# Patient Record
Sex: Female | Born: 1940 | Race: White | Hispanic: No | Smoking: Former smoker
Health system: Southern US, Community
[De-identification: ages and names within clinical notes are randomized; demographics above are authoritative.]

## PROBLEM LIST (undated history)

## (undated) DIAGNOSIS — D509 Iron deficiency anemia, unspecified: Secondary | ICD-10-CM

## (undated) DIAGNOSIS — E039 Hypothyroidism, unspecified: Secondary | ICD-10-CM

## (undated) DIAGNOSIS — Z9119 Patient's noncompliance with other medical treatment and regimen: Secondary | ICD-10-CM

## (undated) DIAGNOSIS — I10 Essential (primary) hypertension: Secondary | ICD-10-CM

## (undated) DIAGNOSIS — J449 Chronic obstructive pulmonary disease, unspecified: Secondary | ICD-10-CM

## (undated) DIAGNOSIS — I5042 Chronic combined systolic (congestive) and diastolic (congestive) heart failure: Secondary | ICD-10-CM

## (undated) DIAGNOSIS — Z91199 Patient's noncompliance with other medical treatment and regimen due to unspecified reason: Secondary | ICD-10-CM

## (undated) DIAGNOSIS — N182 Chronic kidney disease, stage 2 (mild): Secondary | ICD-10-CM

## (undated) DIAGNOSIS — I4892 Unspecified atrial flutter: Secondary | ICD-10-CM

## (undated) HISTORY — PX: HERNIA REPAIR: SHX51

---

## 2011-04-07 DIAGNOSIS — D509 Iron deficiency anemia, unspecified: Secondary | ICD-10-CM | POA: Insufficient documentation

## 2011-08-19 ENCOUNTER — Emergency Department: Payer: Self-pay | Admitting: Unknown Physician Specialty

## 2015-12-17 DIAGNOSIS — I519 Heart disease, unspecified: Secondary | ICD-10-CM | POA: Insufficient documentation

## 2017-11-07 ENCOUNTER — Emergency Department: Payer: Medicare Other

## 2017-11-07 ENCOUNTER — Inpatient Hospital Stay
Admission: EM | Admit: 2017-11-07 | Discharge: 2017-11-11 | DRG: 291 | Disposition: A | Discharge status: Home Health | Payer: Medicare Other | Attending: Internal Medicine | Admitting: Internal Medicine

## 2017-11-07 ENCOUNTER — Encounter: Payer: Self-pay | Admitting: *Deleted

## 2017-11-07 ENCOUNTER — Other Ambulatory Visit: Payer: Self-pay

## 2017-11-07 DIAGNOSIS — R609 Edema, unspecified: Secondary | ICD-10-CM

## 2017-11-07 DIAGNOSIS — Z6841 Body Mass Index (BMI) 40.0 and over, adult: Secondary | ICD-10-CM

## 2017-11-07 DIAGNOSIS — I5023 Acute on chronic systolic (congestive) heart failure: Secondary | ICD-10-CM

## 2017-11-07 DIAGNOSIS — E662 Morbid (severe) obesity with alveolar hypoventilation: Secondary | ICD-10-CM | POA: Diagnosis present

## 2017-11-07 DIAGNOSIS — J441 Chronic obstructive pulmonary disease with (acute) exacerbation: Secondary | ICD-10-CM

## 2017-11-07 DIAGNOSIS — I130 Hypertensive heart and chronic kidney disease with heart failure and stage 1 through stage 4 chronic kidney disease, or unspecified chronic kidney disease: Secondary | ICD-10-CM | POA: Diagnosis not present

## 2017-11-07 DIAGNOSIS — Z7901 Long term (current) use of anticoagulants: Secondary | ICD-10-CM

## 2017-11-07 DIAGNOSIS — E874 Mixed disorder of acid-base balance: Secondary | ICD-10-CM | POA: Diagnosis present

## 2017-11-07 DIAGNOSIS — Z9119 Patient's noncompliance with other medical treatment and regimen: Secondary | ICD-10-CM

## 2017-11-07 DIAGNOSIS — I10 Essential (primary) hypertension: Secondary | ICD-10-CM | POA: Diagnosis present

## 2017-11-07 DIAGNOSIS — Z9981 Dependence on supplemental oxygen: Secondary | ICD-10-CM

## 2017-11-07 DIAGNOSIS — Z8674 Personal history of sudden cardiac arrest: Secondary | ICD-10-CM

## 2017-11-07 DIAGNOSIS — Z87891 Personal history of nicotine dependence: Secondary | ICD-10-CM

## 2017-11-07 DIAGNOSIS — D72829 Elevated white blood cell count, unspecified: Secondary | ICD-10-CM | POA: Diagnosis not present

## 2017-11-07 DIAGNOSIS — J9622 Acute and chronic respiratory failure with hypercapnia: Secondary | ICD-10-CM | POA: Diagnosis present

## 2017-11-07 DIAGNOSIS — J811 Chronic pulmonary edema: Secondary | ICD-10-CM

## 2017-11-07 DIAGNOSIS — I4891 Unspecified atrial fibrillation: Secondary | ICD-10-CM | POA: Diagnosis present

## 2017-11-07 DIAGNOSIS — N182 Chronic kidney disease, stage 2 (mild): Secondary | ICD-10-CM | POA: Diagnosis present

## 2017-11-07 DIAGNOSIS — Y92230 Patient room in hospital as the place of occurrence of the external cause: Secondary | ICD-10-CM | POA: Diagnosis not present

## 2017-11-07 DIAGNOSIS — R402413 Glasgow coma scale score 13-15, at hospital admission: Secondary | ICD-10-CM | POA: Diagnosis present

## 2017-11-07 DIAGNOSIS — T380X5A Adverse effect of glucocorticoids and synthetic analogues, initial encounter: Secondary | ICD-10-CM | POA: Diagnosis not present

## 2017-11-07 DIAGNOSIS — I252 Old myocardial infarction: Secondary | ICD-10-CM

## 2017-11-07 DIAGNOSIS — I4892 Unspecified atrial flutter: Secondary | ICD-10-CM | POA: Diagnosis present

## 2017-11-07 DIAGNOSIS — I509 Heart failure, unspecified: Secondary | ICD-10-CM

## 2017-11-07 DIAGNOSIS — Z9114 Patient's other noncompliance with medication regimen: Secondary | ICD-10-CM

## 2017-11-07 DIAGNOSIS — Z79899 Other long term (current) drug therapy: Secondary | ICD-10-CM

## 2017-11-07 DIAGNOSIS — E039 Hypothyroidism, unspecified: Secondary | ICD-10-CM | POA: Diagnosis present

## 2017-11-07 DIAGNOSIS — I5033 Acute on chronic diastolic (congestive) heart failure: Secondary | ICD-10-CM | POA: Diagnosis present

## 2017-11-07 DIAGNOSIS — D631 Anemia in chronic kidney disease: Secondary | ICD-10-CM | POA: Diagnosis present

## 2017-11-07 DIAGNOSIS — J449 Chronic obstructive pulmonary disease, unspecified: Secondary | ICD-10-CM | POA: Diagnosis present

## 2017-11-07 DIAGNOSIS — J9621 Acute and chronic respiratory failure with hypoxia: Secondary | ICD-10-CM | POA: Diagnosis present

## 2017-11-07 HISTORY — DX: Hypothyroidism, unspecified: E03.9

## 2017-11-07 HISTORY — DX: Unspecified atrial flutter: I48.92

## 2017-11-07 HISTORY — DX: Chronic kidney disease, stage 2 (mild): N18.2

## 2017-11-07 HISTORY — DX: Iron deficiency anemia, unspecified: D50.9

## 2017-11-07 HISTORY — DX: Essential (primary) hypertension: I10

## 2017-11-07 HISTORY — DX: Patient's noncompliance with other medical treatment and regimen due to unspecified reason: Z91.199

## 2017-11-07 HISTORY — DX: Morbid (severe) obesity due to excess calories: E66.01

## 2017-11-07 HISTORY — DX: Chronic combined systolic (congestive) and diastolic (congestive) heart failure: I50.42

## 2017-11-07 HISTORY — DX: Chronic obstructive pulmonary disease, unspecified: J44.9

## 2017-11-07 HISTORY — DX: Patient's noncompliance with other medical treatment and regimen: Z91.19

## 2017-11-07 LAB — BASIC METABOLIC PANEL
Anion gap: 9 (ref 5–15)
BUN: 26 mg/dL — AB (ref 6–20)
CHLORIDE: 109 mmol/L (ref 101–111)
CO2: 29 mmol/L (ref 22–32)
Calcium: 8.7 mg/dL — ABNORMAL LOW (ref 8.9–10.3)
Creatinine, Ser: 1.16 mg/dL — ABNORMAL HIGH (ref 0.44–1.00)
GFR calc Af Amer: 52 mL/min — ABNORMAL LOW (ref >60)
GFR calc non Af Amer: 45 mL/min — ABNORMAL LOW (ref >60)
Glucose, Bld: 100 mg/dL — ABNORMAL HIGH (ref 65–99)
Potassium: 4.5 mmol/L (ref 3.5–5.1)
SODIUM: 147 mmol/L — AB (ref 135–145)

## 2017-11-07 LAB — TROPONIN I: Troponin I: 0.04 ng/mL

## 2017-11-07 LAB — BRAIN NATRIURETIC PEPTIDE: B Natriuretic Peptide: 1362 pg/mL — ABNORMAL HIGH (ref 0.0–100.0)

## 2017-11-07 MED ORDER — ALBUTEROL SULFATE (2.5 MG/3ML) 0.083% IN NEBU
5.0000 mg | INHALATION_SOLUTION | Freq: Once | RESPIRATORY_TRACT | Status: AC
  Administered 2017-11-07: 5 mg via RESPIRATORY_TRACT
  Filled 2017-11-07: qty 6

## 2017-11-07 MED ORDER — METHYLPREDNISOLONE SODIUM SUCC 125 MG IJ SOLR
125.0000 mg | Freq: Once | INTRAMUSCULAR | Status: AC
  Administered 2017-11-07: 125 mg via INTRAVENOUS
  Filled 2017-11-07: qty 2

## 2017-11-07 MED ORDER — ALBUTEROL SULFATE (2.5 MG/3ML) 0.083% IN NEBU
2.5000 mg | INHALATION_SOLUTION | Freq: Once | RESPIRATORY_TRACT | Status: AC
  Administered 2017-11-07: 2.5 mg via RESPIRATORY_TRACT
  Filled 2017-11-07: qty 3

## 2017-11-07 MED ORDER — DEXTROSE 5 % IV SOLN
100.0000 mg | Freq: Once | INTRAVENOUS | Status: AC
  Administered 2017-11-07: 100 mg via INTRAVENOUS
  Filled 2017-11-07: qty 100

## 2017-11-07 MED ORDER — FUROSEMIDE 10 MG/ML IJ SOLN
40.0000 mg | Freq: Once | INTRAMUSCULAR | Status: AC
  Administered 2017-11-07: 40 mg via INTRAVENOUS
  Filled 2017-11-07: qty 4

## 2017-11-07 NOTE — ED Notes (Signed)
RT called to place nebulizer on bipap so that this RN can administer breathing treatments ordered.

## 2017-11-07 NOTE — ED Triage Notes (Signed)
Pt brought to ED via EMS. Pt states she does not know why she is here, her grandson called EMS. Pt presents w/ shortness of breath and generalized brawny edema. Pt reports not taking her home meds as they are rx'd because she "doesn't like taking medication". Pt is very poor historian.

## 2017-11-07 NOTE — ED Provider Notes (Addendum)
[facility] Emergency Department Provider Note  ___________________________________________   First MD Initiated Contact with Patient 11/07/17 2009     (approximate)  I have reviewed the triage vital signs and the nursing notes.   HISTORY  Chief Complaint Leg Swelling   HPI Driscilla MoatsMarian L Borland is a 77 y.o. female with a history of CHF as well as COPD on home O2 at 2 L was presenting to the emergency department with worsening shortness of breath as well as diffuse weakness and leg swelling.  She says that she is only been taking her Lasix intermittently and thinks that she is only supposed to take it as needed.  She is denying any pain.  Medics reported that her oxygen saturation was 85% when fire rescue arrived and so they have upped her nasal cannula oxygen to 3 L/min.  If that resolves her oxygen to the 90s.  Patient reports increased leg swelling bilaterally.  Also with reports of fall x2 today.  However, no claims of headache.  Patient able to ambulate.  Patient blames her falls and the diffuse weakness.  Past Medical History:  Diagnosis Date  . CHF (congestive heart failure) (HCC)   . COPD (chronic obstructive pulmonary disease) (HCC)   . Hypertension   . Myocardial infarct (HCC)   . Thyroid disease     There are no active problems to display for this patient.     Prior to Admission medications   Not on File    Allergies Patient has no known allergies.  History reviewed. No pertinent family history.  Social History Social History   Tobacco Use  . Smoking status: Former Smoker    Types: Cigarettes  . Smokeless tobacco: Never Used  Substance Use Topics  . Alcohol use: No    Frequency: Never  . Drug use: No    Review of Systems  Constitutional: No fever/chills Eyes: No visual changes. ENT: No sore throat. Cardiovascular: Denies chest pain. Respiratory: As above Gastrointestinal: No abdominal pain.  No nausea, no vomiting.  No  diarrhea.  No constipation. Genitourinary: Negative for dysuria. Musculoskeletal: Negative for back pain. Skin: Negative for rash. Neurological: Negative for headaches, focal weakness or numbness.   ____________________________________________   PHYSICAL EXAM:  VITAL SIGNS: ED Triage Vitals  Enc Vitals Group     BP 11/07/17 2031 (!) 159/83     Pulse Rate 11/07/17 2031 82     Resp 11/07/17 2031 19     Temp 11/07/17 2031 98.4 F (36.9 C)     Temp Source 11/07/17 2031 Oral     SpO2 11/07/17 2031 99 %     Weight 11/07/17 2033 (!) 375 lb (170.1 kg)     Height 11/07/17 2033 5' (1.524 m)     Head Circumference --      Peak Flow --      Pain Score --      Pain Loc --      Pain Edu? --      Excl. in GC? --     Constitutional: Alert and oriented.  In no acute distress.  Able to ambulate from the EMS gurney to the bed but only with assistance and appears to be getting short of breath with only a couple steps. Eyes: Conjunctivae are normal.  Head: Atraumatic. Nose: No congestion/rhinnorhea. Mouth/Throat: Mucous membranes are moist.  Neck: No stridor.   Cardiovascular: Normal rate, regular rhythm. Grossly normal heart sounds.   Respiratory: Tachypneic with labored respirations.  Poor  air movement with expiratory wheezes throughout. Gastrointestinal: Soft and nontender. No distention. No CVA tenderness. Musculoskeletal: Severe bilateral lower extremity pitting edema to the bilateral calves all the way to the abdomen. Neurologic:  Normal speech and language. No gross focal neurologic deficits are appreciated. Skin:  Skin is warm, dry and intact. No rash noted. Psychiatric: Mood and affect are normal. Speech and behavior are normal.  ____________________________________________   LABS (all labs ordered are listed, but only abnormal results are displayed)  Labs Reviewed  CBC WITH DIFFERENTIAL/PLATELET - Abnormal; Notable for the following components:      Result Value   WBC 12.5  (*)    RBC 5.57 (*)    Hemoglobin 9.6 (*)    HCT 34.6 (*)    MCV 62.2 (*)    MCH 17.3 (*)    MCHC 27.8 (*)    RDW 21.3 (*)    Neutro Abs 9.8 (*)    Monocytes Absolute 1.4 (*)    All other components within normal limits  BASIC METABOLIC PANEL - Abnormal; Notable for the following components:   Sodium 147 (*)    Glucose, Bld 100 (*)    BUN 26 (*)    Creatinine, Ser 1.16 (*)    Calcium 8.7 (*)    GFR calc non Af Amer 45 (*)    GFR calc Af Amer 52 (*)    All other components within normal limits  TROPONIN I - Abnormal; Notable for the following components:   Troponin I 0.04 (*)    All other components within normal limits  BRAIN NATRIURETIC PEPTIDE   ____________________________________________  EKG  ED ECG REPORT I, Arelia Longest, the attending physician, personally viewed and interpreted this ECG.   Date: 11/07/2017  EKG Time: 2042  Rate: 78  Rhythm: normal sinus rhythm  Axis: Normal  Intervals:Prolonged QTC at 504  ST&T Change: No ST segment elevation or depression.  No abnormal T wave inversion.  ____________________________________________  RADIOLOGY  CHF ____________________________________________   PROCEDURES  Procedure(s) performed:   .Critical Care Performed by: Myrna Blazer, MD Authorized by: Myrna Blazer, MD   Critical care provider statement:    Critical care time (minutes):  35   Critical care was necessary to treat or prevent imminent or life-threatening deterioration of the following conditions:  Respiratory failure   Critical care was time spent personally by me on the following activities:  Ordering and review of laboratory studies, ordering and review of radiographic studies, ordering and performing treatments and interventions and re-evaluation of patient's condition    Critical Care performed:   ____________________________________________   INITIAL IMPRESSION / ASSESSMENT AND PLAN / ED  COURSE  Pertinent labs & imaging results that were available during my care of the patient were reviewed by me and considered in my medical decision making (see chart for details).  Differential includes, but is not limited to, viral syndrome, bronchitis including COPD exacerbation, pneumonia, reactive airway disease including asthma, CHF including exacerbation with or without pulmonary/interstitial edema, pneumothorax, ACS, thoracic trauma, and pulmonary embolism. As part of my medical decision making, I reviewed the following data within the electronic MEDICAL RECORD NUMBER Notes from prior ED visits  ----------------------------------------- 10:10 PM on 11/07/2017 -----------------------------------------  Patient with IV established.  Given 40 of Lasix.  Also status post albuterol neb.  However, appears to have worsening labored respirations.  Will start on BiPAP.  Plan on admission.  Signed out to Dr. Anne Hahn.  Patient understanding of the plan and  willing to comply.      ____________________________________________   FINAL CLINICAL IMPRESSION(S) / ED DIAGNOSES  CHF and COPD exacerbations    NEW MEDICATIONS STARTED DURING THIS VISIT:  New Prescriptions   No medications on file     Note:  This document was prepared using Dragon voice recognition software and may include unintentional dictation errors.  \   Myrna Blazer, MD 11/07/17 2211  Patient tolerating the BiPAP well.  Updated family as to the diagnosis as well as treatment plan.   Myrna Blazer, MD 11/07/17 [id number]

## 2017-11-07 NOTE — Consult Note (Signed)
PULMONARY / CRITICAL CARE MEDICINE   Name: Danielle Munoz MRN: [id number] DOB: 17-Oct-1940    ADMISSION DATE:  11/07/2017 CONSULTATION DATE:  11/07/17  REFERRING MD:  Dr. Anne HahnWillis   CHIEF COMPLAINT:  Shortness of breath  HISTORY OF PRESENT ILLNESS:   48F with PMH as below significant for HFpEF and COPD who presents with acute on chronic dyspnea as well as weakness. CXR with pulmonary vascular congestion, BNP elevated at 1,300. Was treated with lasix, albuterol, then developed worsening respiratory distress requiring NIV. Pt is admitted to ICU.  Pt is a reticent historian. Of note, she has been non-compliant with home diuretics and inhalers, stating that she was under the impression that lasix was to be taken as needed rather than scheduled. Pt does endorse non-productive cough and wheezing and denies cp, n/v/d, and fever/chills.  SIGNIFICANT EVENTS: 2/7 admitted to ICU on NIV  PAST MEDICAL HISTORY :  She  has a past medical history of CHF (congestive heart failure) (HCC), COPD (chronic obstructive pulmonary disease) (HCC), Hypertension, Myocardial infarct (HCC), and Thyroid disease.  PAST SURGICAL HISTORY: She  has a past surgical history that includes Hernia repair.  Allergies no known allergies  No current facility-administered medications on file prior to encounter.    No current outpatient medications on file prior to encounter.    FAMILY HISTORY:  Her has no family status information on file.    SOCIAL HISTORY: She  reports that she has quit smoking. Her smoking use included cigarettes. she has never used smokeless tobacco. She reports that she does not drink alcohol or use drugs.  REVIEW OF SYSTEMS:   10 point review of systems negative except as per HPI  SUBJECTIVE:    VITAL SIGNS: BP (!) 124/49   Pulse 69   Temp 98.4 F (36.9 C) (Oral)   Resp (!) 23   Ht 5' (1.524 m)   Wt (!) 170.1 kg (375 lb)   SpO2 98%   BMI 73.24 kg/m   HEMODYNAMICS:    VENTILATOR  SETTINGS:    INTAKE / OUTPUT: No intake/output data recorded.  PHYSICAL EXAMINATION: General:  Non-distressed elderly female Neuro:  Grossly non-focal HEENT:  NCAT Cardiovascular:  S1S2 RRR no m/g/r 1+ peripheral pulses, 2+ pitting edema Lungs:  Diminished, CTAB Abdomen:  Obese, soft non-tender Musculoskeletal:  No gross deformity Skin:  Warm dry acyanotic  LABS:  BMET Recent Labs  Lab 11/07/17 2119  NA 147*  K 4.5  CL 109  CO2 29  BUN 26*  CREATININE 1.16*  GLUCOSE 100*    Electrolytes Recent Labs  Lab 11/07/17 2119  CALCIUM 8.7*    CBC Recent Labs  Lab 11/07/17 2119  WBC 12.5*  HGB 9.6*  HCT 34.6*  PLT 361    Coag's No results for input(s): APTT, INR in the last 168 hours.  Sepsis Markers No results for input(s): LATICACIDVEN, PROCALCITON, O2SATVEN in the last 168 hours.  ABG No results for input(s): PHART, PCO2ART, PO2ART in the last 168 hours.  Liver Enzymes No results for input(s): AST, ALT, ALKPHOS, BILITOT, ALBUMIN in the last 168 hours.  Cardiac Enzymes Recent Labs  Lab 11/07/17 2119  TROPONINI 0.04*    Glucose No results for input(s): GLUCAP in the last 168 hours.  Imaging Dg Chest 1 View  Result Date: 11/07/2017 CLINICAL DATA:  Shortness of breath. EXAM: CHEST 1 VIEW COMPARISON:  August 19, 2011 FINDINGS: The mediastinal contour is normal. The heart size is enlarged. There is pulmonary edema. There is  no focal pneumonia or pleural effusion. Bony structures are stable. IMPRESSION: Congestive heart failure. Electronically Signed   By: Sherian Rein M.D.   On: 11/07/2017 20:27     STUDIES:  2/7 CXR with pulmonary vascular congestion  CULTURES:   ANTIBIOTICS: Doxycycline 2/7>>  LINES/TUBES: PIV  DISCUSSION: 39F with decompensated HF 2/2 medication non-compliance. ?COPD component as well. Admitted on NIV. Diuresis, bronchodilators wean BiPap, transfer to floor when able.  ASSESSMENT / PLAN:  Acute hypoxic respiratory  failure in the setting of acutely decompensated HFpEF H/O COPD Mildly elevated troponin, likely demand ischemia Lower extremity edema, L>R NIV PRN, wean as able Supplemental O2 PRN  spO2 goal 88-92% Diuresis Bronchodilators Steroids Monitor I&O Trend troponin U/S for DVT r/o    Elinor Dodge, PA-S 11/07/2017, 11:01 PM

## 2017-11-08 ENCOUNTER — Inpatient Hospital Stay: Payer: Medicare Other

## 2017-11-08 ENCOUNTER — Encounter: Payer: Self-pay | Admitting: Physician Assistant

## 2017-11-08 ENCOUNTER — Inpatient Hospital Stay (HOSPITAL_COMMUNITY)
Admit: 2017-11-08 | Discharge: 2017-11-08 | Disposition: A | Discharge status: Home / Self Care | Payer: Medicare Other | Attending: Physician Assistant | Admitting: Physician Assistant

## 2017-11-08 DIAGNOSIS — J9621 Acute and chronic respiratory failure with hypoxia: Secondary | ICD-10-CM | POA: Diagnosis not present

## 2017-11-08 DIAGNOSIS — I5023 Acute on chronic systolic (congestive) heart failure: Secondary | ICD-10-CM

## 2017-11-08 DIAGNOSIS — J432 Centrilobular emphysema: Secondary | ICD-10-CM | POA: Diagnosis not present

## 2017-11-08 DIAGNOSIS — E039 Hypothyroidism, unspecified: Secondary | ICD-10-CM | POA: Diagnosis present

## 2017-11-08 DIAGNOSIS — D631 Anemia in chronic kidney disease: Secondary | ICD-10-CM | POA: Diagnosis present

## 2017-11-08 DIAGNOSIS — E874 Mixed disorder of acid-base balance: Secondary | ICD-10-CM | POA: Diagnosis present

## 2017-11-08 DIAGNOSIS — R609 Edema, unspecified: Secondary | ICD-10-CM | POA: Diagnosis not present

## 2017-11-08 DIAGNOSIS — Z8674 Personal history of sudden cardiac arrest: Secondary | ICD-10-CM | POA: Diagnosis not present

## 2017-11-08 DIAGNOSIS — D72829 Elevated white blood cell count, unspecified: Secondary | ICD-10-CM | POA: Diagnosis not present

## 2017-11-08 DIAGNOSIS — I509 Heart failure, unspecified: Secondary | ICD-10-CM

## 2017-11-08 DIAGNOSIS — Z9119 Patient's noncompliance with other medical treatment and regimen: Secondary | ICD-10-CM | POA: Diagnosis not present

## 2017-11-08 DIAGNOSIS — J441 Chronic obstructive pulmonary disease with (acute) exacerbation: Secondary | ICD-10-CM | POA: Diagnosis present

## 2017-11-08 DIAGNOSIS — N182 Chronic kidney disease, stage 2 (mild): Secondary | ICD-10-CM | POA: Diagnosis present

## 2017-11-08 DIAGNOSIS — R748 Abnormal levels of other serum enzymes: Secondary | ICD-10-CM

## 2017-11-08 DIAGNOSIS — Z79899 Other long term (current) drug therapy: Secondary | ICD-10-CM | POA: Diagnosis not present

## 2017-11-08 DIAGNOSIS — I4891 Unspecified atrial fibrillation: Secondary | ICD-10-CM | POA: Diagnosis present

## 2017-11-08 DIAGNOSIS — I5033 Acute on chronic diastolic (congestive) heart failure: Secondary | ICD-10-CM

## 2017-11-08 DIAGNOSIS — T380X5A Adverse effect of glucocorticoids and synthetic analogues, initial encounter: Secondary | ICD-10-CM | POA: Diagnosis not present

## 2017-11-08 DIAGNOSIS — I4892 Unspecified atrial flutter: Secondary | ICD-10-CM | POA: Diagnosis present

## 2017-11-08 DIAGNOSIS — I130 Hypertensive heart and chronic kidney disease with heart failure and stage 1 through stage 4 chronic kidney disease, or unspecified chronic kidney disease: Secondary | ICD-10-CM | POA: Diagnosis present

## 2017-11-08 DIAGNOSIS — J9622 Acute and chronic respiratory failure with hypercapnia: Secondary | ICD-10-CM

## 2017-11-08 DIAGNOSIS — R0603 Acute respiratory distress: Secondary | ICD-10-CM | POA: Diagnosis not present

## 2017-11-08 DIAGNOSIS — R402413 Glasgow coma scale score 13-15, at hospital admission: Secondary | ICD-10-CM | POA: Diagnosis present

## 2017-11-08 DIAGNOSIS — Z9981 Dependence on supplemental oxygen: Secondary | ICD-10-CM | POA: Diagnosis not present

## 2017-11-08 DIAGNOSIS — Y92230 Patient room in hospital as the place of occurrence of the external cause: Secondary | ICD-10-CM | POA: Diagnosis not present

## 2017-11-08 DIAGNOSIS — Z87891 Personal history of nicotine dependence: Secondary | ICD-10-CM | POA: Diagnosis not present

## 2017-11-08 DIAGNOSIS — Z9114 Patient's other noncompliance with medication regimen: Secondary | ICD-10-CM | POA: Diagnosis not present

## 2017-11-08 DIAGNOSIS — Z6841 Body Mass Index (BMI) 40.0 and over, adult: Secondary | ICD-10-CM | POA: Diagnosis not present

## 2017-11-08 DIAGNOSIS — I252 Old myocardial infarction: Secondary | ICD-10-CM | POA: Diagnosis not present

## 2017-11-08 DIAGNOSIS — Z7901 Long term (current) use of anticoagulants: Secondary | ICD-10-CM | POA: Diagnosis not present

## 2017-11-08 DIAGNOSIS — E662 Morbid (severe) obesity with alveolar hypoventilation: Secondary | ICD-10-CM | POA: Diagnosis not present

## 2017-11-08 LAB — BASIC METABOLIC PANEL
ANION GAP: 5 (ref 5–15)
BUN: 25 mg/dL — ABNORMAL HIGH (ref 6–20)
CO2: 32 mmol/L (ref 22–32)
Calcium: 8.2 mg/dL — ABNORMAL LOW (ref 8.9–10.3)
Chloride: 107 mmol/L (ref 101–111)
Creatinine, Ser: 1.06 mg/dL — ABNORMAL HIGH (ref 0.44–1.00)
GFR calc non Af Amer: 50 mL/min — ABNORMAL LOW (ref >60)
GFR, EST AFRICAN AMERICAN: 58 mL/min — AB (ref >60)
GLUCOSE: 132 mg/dL — AB (ref 65–99)
POTASSIUM: 4.5 mmol/L (ref 3.5–5.1)
Sodium: 144 mmol/L (ref 135–145)

## 2017-11-08 LAB — CBC WITH DIFFERENTIAL/PLATELET
Basophils Absolute: 0.1 10*3/uL (ref 0–0.1)
Basophils Relative: 1 %
EOS ABS: 0.1 10*3/uL (ref 0–0.7)
Eosinophils Relative: 1 %
HCT: 34.6 % — ABNORMAL LOW (ref 35.0–47.0)
Hemoglobin: 9.6 g/dL — ABNORMAL LOW (ref 12.0–16.0)
LYMPHS ABS: 1.2 10*3/uL (ref 1.0–3.6)
Lymphocytes Relative: 10 %
MCH: 17.3 pg — ABNORMAL LOW (ref 26.0–34.0)
MCHC: 27.8 g/dL — ABNORMAL LOW (ref 32.0–36.0)
MCV: 62.2 fL — ABNORMAL LOW (ref 80.0–100.0)
Monocytes Absolute: 1.4 10*3/uL — ABNORMAL HIGH (ref 0.2–0.9)
Monocytes Relative: 11 %
NEUTROS ABS: 9.8 10*3/uL — AB (ref 1.4–6.5)
Neutrophils Relative %: 77 %
Platelets: 361 10*3/uL (ref 150–440)
RBC: 5.57 MIL/uL — ABNORMAL HIGH (ref 3.80–5.20)
RDW: 21.3 % — ABNORMAL HIGH (ref 11.5–14.5)
WBC: 12.5 10*3/uL — AB (ref 3.6–11.0)

## 2017-11-08 LAB — BLOOD GAS, ARTERIAL
ALLENS TEST (PASS/FAIL): POSITIVE — AB
ALLENS TEST (PASS/FAIL): POSITIVE — AB
Acid-Base Excess: 5.7 mmol/L — ABNORMAL HIGH (ref 0.0–2.0)
Acid-Base Excess: 7.1 mmol/L — ABNORMAL HIGH (ref 0.0–2.0)
BICARBONATE: 35.8 mmol/L — AB (ref 20.0–28.0)
Bicarbonate: 34.7 mmol/L — ABNORMAL HIGH (ref 20.0–28.0)
Expiratory PAP: 5
FIO2: 35
FIO2: 35
Inspiratory PAP: 15
Mechanical Rate: 8
O2 SAT: 94.5 %
O2 SAT: 91.5 %
PCO2 ART: 81 mmHg — AB (ref 32.0–48.0)
PCO2 ART: 78 mmHg — AB (ref 32.0–48.0)
PH ART: 7.24 — AB (ref 7.350–7.450)
PH ART: 7.27 — AB (ref 7.350–7.450)
Patient temperature: 37
Patient temperature: 37
RATE: 10 resp/min
pO2, Arterial: 85 mmHg (ref 83.0–108.0)
pO2, Arterial: 71 mmHg — ABNORMAL LOW (ref 83.0–108.0)

## 2017-11-08 LAB — ECHOCARDIOGRAM COMPLETE
HEIGHTINCHES: 60 in
WEIGHTICAEL: 4370.4 [oz_av]

## 2017-11-08 LAB — CBC
HCT: 37.7 % (ref 35.0–47.0)
HEMOGLOBIN: 10.2 g/dL — AB (ref 12.0–16.0)
MCH: 16.9 pg — ABNORMAL LOW (ref 26.0–34.0)
MCHC: 27.1 g/dL — ABNORMAL LOW (ref 32.0–36.0)
MCV: 62.5 fL — AB (ref 80.0–100.0)
Platelets: 362 10*3/uL (ref 150–440)
RBC: 6.03 MIL/uL — AB (ref 3.80–5.20)
RDW: 21.6 % — ABNORMAL HIGH (ref 11.5–14.5)
WBC: 12.9 10*3/uL — AB (ref 3.6–11.0)

## 2017-11-08 LAB — MRSA PCR SCREENING: MRSA by PCR: NEGATIVE

## 2017-11-08 LAB — PROCALCITONIN

## 2017-11-08 LAB — TROPONIN I
Troponin I: 0.03 ng/mL (ref <0.03)
Troponin I: 0.03 ng/mL (ref <0.03)
Troponin I: <0.03 ng/mL (ref <0.03)

## 2017-11-08 LAB — HEPARIN LEVEL (UNFRACTIONATED): Heparin Unfractionated: <0.1 IU/mL — ABNORMAL LOW (ref 0.30–0.70)

## 2017-11-08 LAB — APTT
APTT: 28 s (ref 24–36)
aPTT: 80 seconds — ABNORMAL HIGH (ref 24–36)

## 2017-11-08 LAB — GLUCOSE, CAPILLARY: Glucose-Capillary: 137 mg/dL — ABNORMAL HIGH (ref 65–99)

## 2017-11-08 MED ORDER — ACETAMINOPHEN 325 MG PO TABS: 650.0000 mg | ORAL_TABLET | Freq: Four times a day (QID) | ORAL | Status: DC | PRN

## 2017-11-08 MED ORDER — ACETAMINOPHEN 650 MG RE SUPP: 650.0000 mg | Freq: Four times a day (QID) | RECTAL | Status: DC | PRN

## 2017-11-08 MED ORDER — ONDANSETRON HCL 4 MG/2ML IJ SOLN: 4.0000 mg | Freq: Four times a day (QID) | INTRAMUSCULAR | Status: DC | PRN

## 2017-11-08 MED ORDER — APIXABAN 5 MG PO TABS: 5.0000 mg | ORAL_TABLET | Freq: Two times a day (BID) | ORAL | Status: DC

## 2017-11-08 MED ORDER — AZITHROMYCIN 500 MG PO TABS
500.0000 mg | ORAL_TABLET | Freq: Every day | ORAL | Status: DC
  Filled 2017-11-08: qty 1

## 2017-11-08 MED ORDER — FUROSEMIDE 10 MG/ML IJ SOLN
40.0000 mg | Freq: Two times a day (BID) | INTRAMUSCULAR | Status: DC
Start: 2017-11-08 — End: 2017-11-11
  Administered 2017-11-08 – 2017-11-11 (×6): 40 mg via INTRAVENOUS
  Filled 2017-11-08 (×6): qty 4

## 2017-11-08 MED ORDER — FUROSEMIDE 10 MG/ML IJ SOLN
20.0000 mg | Freq: Once | INTRAMUSCULAR | Status: AC
  Administered 2017-11-08: 20 mg via INTRAVENOUS
  Filled 2017-11-08: qty 2

## 2017-11-08 MED ORDER — FUROSEMIDE 10 MG/ML IJ SOLN
20.0000 mg | Freq: Two times a day (BID) | INTRAMUSCULAR | Status: DC
  Administered 2017-11-08: 20 mg via INTRAVENOUS
  Filled 2017-11-08 (×2): qty 2

## 2017-11-08 MED ORDER — BUDESONIDE 0.25 MG/2ML IN SUSP
0.2500 mg | Freq: Two times a day (BID) | RESPIRATORY_TRACT | Status: DC
Start: 2017-11-08 — End: 2017-11-11
  Administered 2017-11-08 – 2017-11-11 (×7): 0.25 mg via RESPIRATORY_TRACT
  Filled 2017-11-08 (×5): qty 2

## 2017-11-08 MED ORDER — HEPARIN BOLUS VIA INFUSION
4000.0000 [IU] | Freq: Once | INTRAVENOUS | Status: AC
  Administered 2017-11-08: 4000 [IU] via INTRAVENOUS
  Filled 2017-11-08: qty 4000

## 2017-11-08 MED ORDER — ONDANSETRON HCL 4 MG PO TABS: 4.0000 mg | ORAL_TABLET | Freq: Four times a day (QID) | ORAL | Status: DC | PRN

## 2017-11-08 MED ORDER — HEPARIN (PORCINE) IN NACL 100-0.45 UNIT/ML-% IJ SOLN
1150.0000 [IU]/h | INTRAMUSCULAR | Status: AC
  Administered 2017-11-08 – 2017-11-09 (×2): 1150 [IU]/h via INTRAVENOUS
  Filled 2017-11-08 (×2): qty 250

## 2017-11-08 MED ORDER — LEVOTHYROXINE SODIUM 100 MCG PO TABS
100.0000 ug | ORAL_TABLET | Freq: Every day | ORAL | Status: DC
  Administered 2017-11-08 – 2017-11-11 (×4): 100 ug via ORAL
  Filled 2017-11-08: qty 1
  Filled 2017-11-08: qty 2
  Filled 2017-11-08: qty 1
  Filled 2017-11-08: qty 2

## 2017-11-08 MED ORDER — IPRATROPIUM-ALBUTEROL 0.5-2.5 (3) MG/3ML IN SOLN
3.0000 mL | Freq: Four times a day (QID) | RESPIRATORY_TRACT | Status: DC | PRN
Start: 2017-11-08 — End: 2017-11-11
  Administered 2017-11-09: 3 mL via RESPIRATORY_TRACT
  Filled 2017-11-08: qty 3

## 2017-11-08 MED ORDER — METHYLPREDNISOLONE SODIUM SUCC 125 MG IJ SOLR
60.0000 mg | Freq: Four times a day (QID) | INTRAMUSCULAR | Status: DC
  Administered 2017-11-08 – 2017-11-09 (×4): 60 mg via INTRAVENOUS
  Filled 2017-11-08 (×4): qty 2

## 2017-11-08 MED ORDER — LEVOFLOXACIN IN D5W 750 MG/150ML IV SOLN
750.0000 mg | Freq: Every day | INTRAVENOUS | Status: DC
  Administered 2017-11-08 – 2017-11-09 (×2): 750 mg via INTRAVENOUS
  Filled 2017-11-08 (×3): qty 150

## 2017-11-08 NOTE — H&P (Signed)
[facility] [place] at [facility]   PATIENT NAME: Danielle Munoz    MR#:  [id number]  DATE OF BIRTH:  [birth year]  DATE OF ADMISSION:  11/07/2017  PRIMARY CARE PHYSICIAN: [facility], [facility]   REQUESTING/REFERRING PHYSICIAN: Pershing Proud, MD  CHIEF COMPLAINT:   Chief Complaint  Patient presents with  . Leg Swelling    HISTORY OF PRESENT ILLNESS:  Danielle Munoz  is a 77 y.o. female who presents with leg swelling and shortness of breath.  Patient was found here to be in significant heart failure exacerbation.  She required BiPAP due to her respiratory status.  Hospitalist were called for admission  PAST MEDICAL HISTORY:   Past Medical History:  Diagnosis Date  . CHF (congestive heart failure) (HCC)   . COPD (chronic obstructive pulmonary disease) (HCC)   . Hypertension   . Myocardial infarct (HCC)   . Thyroid disease     PAST SURGICAL HISTORY:   Past Surgical History:  Procedure Laterality Date  . HERNIA REPAIR      SOCIAL HISTORY:   Social History   Tobacco Use  . Smoking status: Former Smoker    Types: Cigarettes  . Smokeless tobacco: Never Used  Substance Use Topics  . Alcohol use: No    Frequency: Never    FAMILY HISTORY:   Family History  Problem Relation Age of Onset  . Diabetes Brother   . CAD Mother   . CAD Father   . Breast cancer Sister     DRUG ALLERGIES:  Allergies no known allergies  MEDICATIONS AT HOME:   Prior to Admission medications   Medication Sig Start Date End Date Taking? Authorizing Provider  apixaban (ELIQUIS) 5 MG TABS tablet Take 5 mg by mouth 2 (two) times daily. 07/16/17  Yes [provider]  furosemide (LASIX) 20 MG tablet Take 20 mg by mouth daily as needed. 07/22/17 07/22/18 Yes [provider]  levothyroxine (SYNTHROID, LEVOTHROID) 100 MCG tablet Take 100 mcg by mouth daily. 07/18/17 07/18/18 Yes [provider]    REVIEW OF SYSTEMS:  Review of Systems   Constitutional: Negative for chills, fever, malaise/fatigue and weight loss.  HENT: Negative for ear pain, hearing loss and tinnitus.   Eyes: Negative for blurred vision, double vision, pain and redness.  Respiratory: Positive for shortness of breath. Negative for cough and hemoptysis.   Cardiovascular: Positive for leg swelling. Negative for chest pain, palpitations and orthopnea.  Gastrointestinal: Negative for abdominal pain, constipation, diarrhea, nausea and vomiting.  Genitourinary: Negative for dysuria, frequency and hematuria.  Musculoskeletal: Negative for back pain, joint pain and neck pain.  Skin:       No acne, rash, or lesions  Neurological: Negative for dizziness, tremors, focal weakness and weakness.  Endo/Heme/Allergies: Negative for polydipsia. Does not bruise/bleed easily.  Psychiatric/Behavioral: Negative for depression. The patient is not nervous/anxious and does not have insomnia.      VITAL SIGNS:   Vitals:   11/07/17 2230 11/07/17 2245 11/07/17 2300 11/07/17 2310  BP: (!) 136/58 (!) 139/50 (!) 143/54   Pulse: 69 69 74 63  Resp: 16 18 18 14   Temp:      TempSrc:      SpO2: 100% 100% 99% 100%  Weight:      Height:       Wt Readings from Last 3 Encounters:  11/07/17 (!) 170.1 kg (375 lb)    PHYSICAL EXAMINATION:  Physical Exam  Vitals reviewed. Constitutional: She is oriented to person, place,  and time. She appears well-developed and well-nourished. No distress.  HENT:  Head: Normocephalic and atraumatic.  Mouth/Throat: Oropharynx is clear and moist.  Eyes: Conjunctivae and EOM are normal. Pupils are equal, round, and reactive to light. No scleral icterus.  Neck: Normal range of motion. Neck supple. No JVD present. No thyromegaly present.  Cardiovascular: Normal rate, regular rhythm and intact distal pulses. Exam reveals no gallop and no friction rub.  No murmur heard. Respiratory: Effort normal. No respiratory distress. She has no wheezes. She has  rales.  GI: Soft. Bowel sounds are normal. She exhibits no distension. There is no tenderness.  Musculoskeletal: Normal range of motion. She exhibits edema.  No arthritis, no gout  Lymphadenopathy:    She has no cervical adenopathy.  Neurological: She is alert and oriented to person, place, and time. No cranial nerve deficit.  No dysarthria, no aphasia  Skin: Skin is warm and dry. No rash noted. No erythema.  Psychiatric: She has a normal mood and affect. Her behavior is normal. Judgment and thought content normal.    LABORATORY PANEL:   CBC Recent Labs  Lab 11/07/17 2119  WBC 12.5*  HGB 9.6*  HCT 34.6*  PLT 361   ------------------------------------------------------------------------------------------------------------------  Chemistries  Recent Labs  Lab 11/07/17 2119  NA 147*  K 4.5  CL 109  CO2 29  GLUCOSE 100*  BUN 26*  CREATININE 1.16*  CALCIUM 8.7*   ------------------------------------------------------------------------------------------------------------------  Cardiac Enzymes Recent Labs  Lab 11/07/17 2119  TROPONINI 0.04*   ------------------------------------------------------------------------------------------------------------------  RADIOLOGY:  Dg Chest 1 View  Result Date: 11/07/2017 CLINICAL DATA:  Shortness of breath. EXAM: CHEST 1 VIEW COMPARISON:  August 19, 2011 FINDINGS: The mediastinal contour is normal. The heart size is enlarged. There is pulmonary edema. There is no focal pneumonia or pleural effusion. Bony structures are stable. IMPRESSION: Congestive heart failure. Electronically Signed   By: Sherian ReinWei-Chen  Lin M.D.   On: 11/07/2017 20:27    EKG:   Orders placed or performed during the hospital encounter of 11/07/17  . ED EKG  . ED EKG  . EKG 12-Lead  . EKG 12-Lead    IMPRESSION AND PLAN:  Principal Problem:   Acute on chronic systolic CHF (congestive heart failure) (HCC) -diuretics given in the ED, repeat dose ordered,  patient required BiPAP due to her respiratory status, will admit her to stepdown and get a cardiology consult Active Problems:   HTN (hypertension) -continue home meds   COPD (chronic obstructive pulmonary disease) (HCC) -home dose inhalers   Hypothyroidism -home dose thyroid replacement  All the records are reviewed and case discussed with ED provider. Management plans discussed with the patient and/or family.  DVT PROPHYLAXIS: Systemic anticoagulation  GI PROPHYLAXIS: None  ADMISSION STATUS: Inpatient  CODE STATUS: Full Code Status History    This patient does not have a recorded code status. Please follow your organizational policy for patients in this situation.      TOTAL TIME TAKING CARE OF THIS PATIENT: 45 minutes.   Rane Blitch FIELDING 11/08/2017, 12:00 AM  [facility]  Office  [phone]  CC: Primary care physician; [facility]  Note:  This document was prepared using Dragon voice recognition software and may include unintentional dictation errors.

## 2017-11-08 NOTE — Progress Notes (Signed)
*  PRELIMINARY RESULTS* Echocardiogram 2D Echocardiogram has been performed.  Cristela BlueHege, Shyne Lehrke 11/08/2017, 1:51 PM

## 2017-11-08 NOTE — Plan of Care (Signed)
  Progressing Clinical Measurements: Ability to maintain clinical measurements within normal limits will improve 11/08/2017 0412 - Progressing by Sandi RavelingFlack, Tunya Held, RN Diagnostic test results will improve 11/08/2017 0412 - Progressing by Sandi RavelingFlack, Sahith Nurse, RN Respiratory complications will improve 11/08/2017 0412 - Progressing by Sandi RavelingFlack, Elnor Renovato, RN Note Switched from bipap to nasal cannula Cardiovascular complication will be avoided 11/08/2017 0412 - Progressing by Sandi RavelingFlack, Kaci Dillie, RN Nutrition: Adequate nutrition will be maintained 11/08/2017 0412 - Progressing by Sandi RavelingFlack, Humzah Harty, RN Coping: Level of anxiety will decrease 11/08/2017 0412 - Progressing by Sandi RavelingFlack, Eva Vallee, RN Elimination: Will not experience complications related to urinary retention 11/08/2017 0412 - Progressing by Sandi RavelingFlack, Janyra Barillas, RN Pain Managment: General experience of comfort will improve 11/08/2017 0412 - Progressing by Sandi RavelingFlack, Terrie Grajales, RN Safety: Ability to remain free from injury will improve 11/08/2017 0412 - Progressing by Sandi RavelingFlack, Karver Fadden, RN

## 2017-11-08 NOTE — Significant Event (Signed)
Pt admitted to ICU-4 from ED. [place] given [place] from [place], [place]. Arrived on Bipap, hooked up to monitor and VSS. CHG bath given, sacrum foam applied and assessment completed. Bipap switched to nasal cannula. See flowsheets and MAR for more details.

## 2017-11-08 NOTE — Consult Note (Signed)
Cardiology Consultation:   [name]; [id number]; 24-Sep-1941   Admit date: 11/07/2017 Date of Consult: 11/08/2017  Primary Care Provider: [facility] Primary Cardiologist: [facility]   Patient Profile:   Danielle Munoz is a 77 y.o. female with a hx of chronic combined CHF, atrial flutter s/p TEE/DCCV 63/0160 complicated by asystole with junctional rhythm requiring epi, atropine, and COPR for < 1 minute on Eliquis and amiodarone with uncertain compliance, COPD possibly on supplemental oxygen (details unclear), iron deficiency anemia, hypothyroidism, CKD stage II, morbid obesity with possible OSA/OHS, vasovagal syncope, bilateral lower extremity edema, HTN, diverticulosis, and recurrent ventral hernia who is being seen today for the evaluation of acute on chronic combined CHF at the request of Dr. Jannifer Franklin.  History of Present Illness:   Danielle Munoz has been followed by [facility] over the years, initially for syncope that was ultimately felt to be vasovagal in etiology following cardiac monitoring (details unclear), echo demonstrating preserved LVSF, and stress echo in 04/2015 that was normal. She was recently admitted to [facility] in 07/2017 with PNA and developed new onset Atrial flutter with RVR. She was started on anticoagulation and underwent successful TEE/DCCV with a single shock at 109 J that was complicated by a brief asystolic arrest requiring transcutaneous pacing as well as 1 mg of epi and atropine with CPR < 1 minute prior to ROSC. She was then noted to be in junctional rhythm. She required inpatient diuresis ~ 9 pounds and was placed on amiodarone along with Eliquis. Echo confirmed preserved LVSF with an EF of 50%. She was seen by [facility]iology on 10/22 with a weight of 240 pounds. She was noted to have LE swelling that was chronic. She was started on Lasix 20 mg daily prn LE swellingand continued on amiodarone. She called her PCP in 10/2017 with  worsening LE swelling and an appointment was scheduled, she did not keep this.   She presented to [facility] on 2/7 with worsening SOB of unclear duration. Patient is not able to provide details at this time given somnolence. No further details were able to be obtained from prior documentation or patient. No family present.   Upon her arrival to [facility] she was noted to be at least 33 pounds up form her 10/22 [facility] visit (240-->273 pounds). She was placed on BiPAP. EKG not acute as below. CXR showed CHF with increasing left basilar pleural effusion. Labs showed a BNP of 1362, troponin 0.04-->0.03 x 2, Na 147-->144, K+ 4.5, SCr 1.16-->1.06, BUN 26-->25, WBC 12.5-->12.9, HGB 9.6-->10.2, PLT 261. She has been given IV Lasix total 60 mg since her admission with a documented UOP of 300 mL. This morning, she was weaned off BiPAP and placed on nasal cannula. Echo is pending. She is currently unable to provide any details of her presentation given somnolence.   Past Medical History:  Diagnosis Date  . Atrial flutter ([place])    a. s/p TEE/DCCV 32/35 @ [facility] complicated by asystole and junctional rhythm requiring epi, atropine, and CPR x < 1 minute; b. amio and Eliquis; c. CHADS2VASC => 6 (CHF, HTN, age x 2, vascular disease, female)  . Chronic combined systolic and diastolic CHF (congestive heart failure) ([place])   . CKD (chronic kidney disease), stage II   . COPD (chronic obstructive pulmonary disease) ([place])   . Hypertension   . Hypothyroidism   . Iron deficiency anemia   . Morbid obesity ([place])   . Noncompliance  Past Surgical History:  Procedure Laterality Date  . HERNIA REPAIR       Home Meds: Prior to Admission medications   Medication Sig Start Date End Date Taking? Authorizing Provider  apixaban (ELIQUIS) 5 MG TABS tablet Take 5 mg by mouth 2 (two) times daily. 07/16/17  Yes [provider]  furosemide (LASIX) 20 MG tablet Take 20 mg by mouth daily as needed. 07/22/17 07/22/18 Yes [provider]  levothyroxine (SYNTHROID, LEVOTHROID) 100 MCG tablet Take 100 mcg by mouth daily. 07/18/17 07/18/18 Yes [provider]    Inpatient Medications: Scheduled Meds: . azithromycin  500 mg Oral Daily  . budesonide (PULMICORT) nebulizer solution  0.25 mg Nebulization BID  . furosemide  20 mg Intravenous BID  . levothyroxine  100 mcg Oral QAC breakfast  . methylPREDNISolone (SOLU-MEDROL) injection  60 mg Intravenous Q6H   Continuous Infusions:  PRN Meds: acetaminophen **OR** acetaminophen, ipratropium-albuterol, ondansetron **OR** ondansetron (ZOFRAN) IV  Allergies:  No Known Allergies  Social History:   Social History   Socioeconomic History  . Marital status: Single    Spouse name: Not on file  . Number of children: Not on file  . Years of education: Not on file  . Highest education level: Not on file  Social Needs  . Financial resource strain: Not on file  . Food insecurity - worry: Not on file  . Food insecurity - inability: Not on file  . Transportation needs - medical: Not on file  . Transportation needs - non-medical: Not on file  Occupational History  . Not on file  Tobacco Use  . Smoking status: Former Smoker    Types: Cigarettes  . Smokeless tobacco: Never Used  Substance and Sexual Activity  . Alcohol use: No    Frequency: Never  . Drug use: No  . Sexual activity: Not on file  Other Topics Concern  . Not on file  Social History Narrative  . Not on file     Family History:  Family History  Problem Relation Age of Onset  . Diabetes Brother   . CAD Mother   . CAD Father   . Breast cancer Sister     ROS:  Review of Systems  Unable to perform ROS: Mental status change      Physical Exam/Data:   Vitals:   11/08/17 0400 11/08/17 0500 11/08/17 0600 11/08/17 0700  BP: (!) 149/51 (!) 133/51 (!) 136/54 (!) 132/55  Pulse: 79 71 81 76  Resp:      Temp:      TempSrc:      SpO2: 97% 98% 96% 97%  Weight:      Height:         Intake/Output Summary (Last 24 hours) at 11/08/2017 0931 Last data filed at 11/08/2017 0600 Gross per 24 hour  Intake 500 ml  Output 800 ml  Net -300 ml   Filed Weights   11/07/17 2033 11/08/17 0111  Weight: (!) 375 lb (170.1 kg) 273 lb 2.4 oz (123.9 kg)   Body mass index is 53.35 kg/m.   Physical Exam: General: Well developed, well nourished, somnolent. Head: Normocephalic, atraumatic, sclera non-icteric, no xanthomas, nares without discharge. Neck: Negative for carotid bruits. JVD difficult to assess secondary to body habitus. Lungs: Diminished breath sounds bilaterally with expiratory wheezing and bibasilar crackles. Breathing is unlabored. Heart: RRR with S1 S2. No murmurs, rubs, or gallops appreciated. Abdomen: Soft, non-tender, non-distended with normoactive bowel sounds. No hepatomegaly. No rebound/guarding. No obvious abdominal  masses. Msk:  Strength and tone appear normal for age. Extremities: No clubbing or cyanosis. 2-3+ pitting edema to the bilateral thighs.  Neuro: Somnolent. Psych:  Somnolent.   EKG:  The EKG was personally reviewed and demonstrates: NSR, 78 bpm, prolonged QT, nonspecific lateral st/t changes Telemetry:  Telemetry was personally reviewed and demonstrates: NSR with rare blocked PAC with 2.1 second pause  Weights: Filed Weights   11/07/17 2033 11/08/17 0111  Weight: (!) 375 lb (170.1 kg) 273 lb 2.4 oz (123.9 kg)    Relevant CV Studies: TEE 07/2017: CONCLUSIONS Mild global LV systolic dysfunction Normal appearing valves No LA thrombus  TTE 07/2017: INTERPRETATION MILD LV SYSTOLIC DYSFUNCTION (See above) WITH MILD LVH LIMITED ECHO NO APICAL VIEW PT WAS BLISTERED AND SOAR IN APICAL WINDOW REGION AORTIC VALVE SCLEROSIS NO STENOSIS MODERATE MAC NORMAL RV SIZE AND FUNCTION COMPARED TO PRIOR STUDIES EF A LITTLE LESS  Stress echo 04/2015: INTERPRETATION ---------------------------------------------------------------   NORMAL STRESS  TEST.  NORMAL LA PRESSURES WITH DIASTOLIC DYSFUNCTION  VALVULAR REGURGITATION: TRIVIAL TR  NO VALVULAR STENOSIS  Note: POOR SOUND TRANSMISSION LIMITS THE SENSITIVITY OF THIS EXAM  NORMAL RESTING BP - EXAGGERATED RESPONSE  TTE pending.   Laboratory Data:  Chemistry Recent Labs  Lab 11/07/17 2119 11/08/17 0129  NA 147* 144  K 4.5 4.5  CL 109 107  CO2 29 32  GLUCOSE 100* 132*  BUN 26* 25*  CREATININE 1.16* 1.06*  CALCIUM 8.7* 8.2*  GFRNONAA 45* 50*  GFRAA 52* 58*  ANIONGAP 9 5    No results for input(s): PROT, ALBUMIN, AST, ALT, ALKPHOS, BILITOT in the last 168 hours. Hematology Recent Labs  Lab 11/07/17 2119 11/08/17 0245  WBC 12.5* 12.9*  RBC 5.57* 6.03*  HGB 9.6* 10.2*  HCT 34.6* 37.7  MCV 62.2* 62.5*  MCH 17.3* 16.9*  MCHC 27.8* 27.1*  RDW 21.3* 21.6*  PLT 361 362   Cardiac Enzymes Recent Labs  Lab 11/07/17 2119 11/08/17 0129  TROPONINI 0.04* 0.03*  0.03*   No results for input(s): TROPIPOC in the last 168 hours.  BNP Recent Labs  Lab 11/07/17 2119  BNP 1,362.0*    DDimer No results for input(s): DDIMER in the last 168 hours.  Radiology/Studies:  Dg Chest 1 View  Result Date: 11/07/2017 IMPRESSION: Congestive heart failure. Electronically Signed   By: Abelardo Diesel M.D.   On: 11/07/2017 20:27   Dg Chest Port 1 View  Result Date: 11/08/2017 IMPRESSION: Increasing left basilar pleural effusion. Stable vascular congestion. Electronically Signed   By: Inez Catalina M.D.   On: 11/08/2017 07:01    Assessment and Plan:   1. Acute on possible chronic respiratory failure with hypoxia and hypercapnia: -Likely multifactorial including AECOPD, OSA/OHS, possible pulmonary hypertension, morbid obesity, and acute on chronic combined CHF -Somnolent this morning, not really able to answer questions during consultation -Stat ABG -Discussed with PCCM, will place back on BiPAP -Management of supplemental oxygen per PCCM  2. Acute on chronic combined  CHF/possible pulmonary HTN: -She appears grossly volume overloaded with a weight that is up at least 33 pounds from her 07/22/2017 visit at [facility] (240-->273 pounds) -Agree with IV Lasix with KCl repletion and monitoring of renal function -Hold off on beta blocker at this time given acute decompensation as well as history of junctional rhythm in 07/2017 -Echo pending to evaluate for worsening EF from baseline of 50% -If echo demonstrates worsening of EF or WMA consistent with ischemia she would likely require ischemic evaluation prior to  discharge once her respiratory status is improved and after she has been diuresed  -She has been continued on Eliquis as below, we will change this to heparin gtt in case she requires invasive cardiac evaluation  -Given LE swelling, would avoid PTA amlodipine  -CHF education -Strict Is and Os -Likely in the setting of noncompliance, though difficult to gather a history on her at this time as above  3. Elevated troponin: -Mildly elevated with a peak of 0.04 and down trending -Likely supply demand ischemia in the setting of the above -Echo pending as above -Has been on Eliquis in place of ASA -Not on BB as above -Check lipid and A1c for further risk stratification   4. Atrial flutter s/p DCCV 91/4445 complicated by asystole and junctional rhythm: -Followed by [facility] -Currently in sinus rhythm -Continue amiodarone per [facility] -Not on BB as above -Has been on Eliquis given CHADS2VASC of at least 6 (CHF, HTN, age x 2, vascular disease, female). Uncertain when she exactly took her last dose given her somnolence as above -Will transition to heparin gtt in case she requires invasive procedures this admission -Potassium at goal  5. AECOPD/leukocytosis: -Per PCCM  6. Morbid obesity with possible OSA/OHS: -Weight loss advised -Needs outpatient sleep study  7. CKD stage II: -At her ~ baseline of 1.2  8. Iron deficiency anemia: -Likely exacerbating the  above -HGB stable -Per IM   For questions or updates, please contact [place] Please consult [email or web address] for contact info under Cardiology/STEMI.   Signed, Christell Faith, PA-C [place] Pager: [phone] 11/08/2017, 9:31 AM

## 2017-11-08 NOTE — Progress Notes (Signed)
Pt transported to  icu- 4 without any incident remains on BIPAP , BIPAP IS PLUGGED INTO THE REDOUTLET , NO SIGNS RESP DISTRESS NOTED

## 2017-11-08 NOTE — Progress Notes (Signed)
[employer] [facility] [place] at [facility]   PATIENT NAME: Danielle Munoz    MR#:  [id number]  DATE OF BIRTH:  08-22-1941  SUBJECTIVE:  CHIEF COMPLAINT:   Chief Complaint  Patient presents with  . Leg Swelling     Came with leg swelling and shortness of breath, noted in CHF exacerbation.   In early morning again was lethargic and repeat ABG showed CO2 retention so started on BiPAP.  REVIEW OF SYSTEMS:  CONSTITUTIONAL: No fever, fatigue or weakness.  EYES: No blurred or double vision.  EARS, NOSE, AND THROAT: No tinnitus or ear pain.  RESPIRATORY: No cough, have shortness of breath, no wheezing or hemoptysis.  CARDIOVASCULAR: No chest pain, orthopnea, have edema.  GASTROINTESTINAL: No nausea, vomiting, diarrhea or abdominal pain.  GENITOURINARY: No dysuria, hematuria.  ENDOCRINE: No polyuria, nocturia,  HEMATOLOGY: No anemia, easy bruising or bleeding SKIN: No rash or lesion. MUSCULOSKELETAL: No joint pain or arthritis.   NEUROLOGIC: No tingling, numbness, weakness.  PSYCHIATRY: No anxiety or depression.   ROS  DRUG ALLERGIES:  No Known Allergies  VITALS:  Blood pressure (!) 136/58, pulse 65, temperature 98.3 F (36.8 C), temperature source Axillary, resp. rate (!) 22, height 5' (1.524 m), weight 123.9 kg (273 lb 2.4 oz), SpO2 96 %.  PHYSICAL EXAMINATION:  GENERAL:  77 y.o.-year-old patient lying in the bed with no acute distress.  EYES: Pupils equal, round, reactive to light and accommodation. No scleral icterus. Extraocular muscles intact.  HEENT: Head atraumatic, normocephalic. Oropharynx and nasopharynx clear.  NECK:  Supple, no jugular venous distention. No thyroid enlargement, no tenderness.  LUNGS: Normal breath sounds bilaterally, no wheezing, have crepitation. No use of accessory muscles of respiration. BiPAP in use. CARDIOVASCULAR: S1, S2 normal. No murmurs, rubs, or gallops.  ABDOMEN: Soft, nontender, nondistended. Bowel sounds present. No organomegaly  or mass.  EXTREMITIES: Bilateral pedal edema, no cyanosis, or clubbing.  NEUROLOGIC: Cranial nerves II through XII are intact. Muscle strength 4-5/5 in all extremities. Sensation intact. Gait not checked.  PSYCHIATRIC: The patient is alert and oriented x 3.  SKIN: No obvious rash, lesion, or ulcer.   Physical Exam LABORATORY PANEL:   CBC Recent Labs  Lab 11/08/17 0245  WBC 12.9*  HGB 10.2*  HCT 37.7  PLT 362   ------------------------------------------------------------------------------------------------------------------  Chemistries  Recent Labs  Lab 11/08/17 0129  NA 144  K 4.5  CL 107  CO2 32  GLUCOSE 132*  BUN 25*  CREATININE 1.06*  CALCIUM 8.2*   ------------------------------------------------------------------------------------------------------------------  Cardiac Enzymes Recent Labs  Lab 11/08/17 0129 11/08/17 1336  TROPONINI 0.03*  0.03* <0.03   ------------------------------------------------------------------------------------------------------------------  RADIOLOGY:  Dg Chest 1 View  Result Date: 11/07/2017 CLINICAL DATA:  Shortness of breath. EXAM: CHEST 1 VIEW COMPARISON:  August 19, 2011 FINDINGS: The mediastinal contour is normal. The heart size is enlarged. There is pulmonary edema. There is no focal pneumonia or pleural effusion. Bony structures are stable. IMPRESSION: Congestive heart failure. Electronically Signed   By: Sherian Rein M.D.   On: 11/07/2017 20:27   US Venous Img Upper Uni Left  Result Date: 11/08/2017 CLINICAL DATA:  Left arm swelling EXAM: LEFT UPPER EXTREMITY VENOUS DOPPLER ULTRASOUND TECHNIQUE: Gray-scale sonography with graded compression, as well as color Doppler and duplex ultrasound were performed to evaluate the upper extremity deep venous system from the level of the subclavian vein and including the jugular, axillary, basilic, radial, ulnar and upper cephalic vein. Spectral Doppler was utilized to evaluate flow at  rest  and with distal augmentation maneuvers. COMPARISON:  None. FINDINGS: Contralateral Subclavian Vein: Respiratory phasicity is normal and symmetric with the symptomatic side. No evidence of thrombus. Normal compressibility. Internal Jugular Vein: No evidence of thrombus. Normal compressibility, respiratory phasicity and response to augmentation. Subclavian Vein: No evidence of thrombus. Normal compressibility, respiratory phasicity and response to augmentation. Axillary Vein: No evidence of thrombus. Normal compressibility, respiratory phasicity and response to augmentation. Cephalic Vein: No evidence of thrombus. Normal compressibility, respiratory phasicity and response to augmentation. Basilic Vein: No evidence of thrombus. Normal compressibility, respiratory phasicity and response to augmentation. Brachial Veins: No evidence of thrombus. Normal compressibility, respiratory phasicity and response to augmentation. Radial Veins: No evidence of thrombus. Normal compressibility, respiratory phasicity and response to augmentation. Ulnar Veins: No evidence of thrombus. Normal compressibility, respiratory phasicity and response to augmentation. Venous Reflux:  None visualized. Other Findings:  None visualized. IMPRESSION: No evidence of DVT within the left upper extremity. Electronically Signed   By: Alcide CleverMark  Lukens M.D.   On: 11/08/2017 11:45   Dg Chest Port 1 View  Result Date: 11/08/2017 CLINICAL DATA:  Follow-up pulmonary edema EXAM: PORTABLE CHEST 1 VIEW COMPARISON:  11/07/2017 FINDINGS: Cardiac shadow remains enlarged. Vascular congestion is again noted. Increasing left-sided pleural effusion is noted. No acute bony abnormality is seen. IMPRESSION: Increasing left basilar pleural effusion. Stable vascular congestion. Electronically Signed   By: Alcide CleverMark  Lukens M.D.   On: 11/08/2017 07:01    ASSESSMENT AND PLAN:   Principal Problem:   Acute on chronic systolic CHF (congestive heart failure) (HCC) Active  Problems:   Hypothyroidism   HTN (hypertension)   COPD (chronic obstructive pulmonary disease) (HCC)  * Altered mental status   Due to CO2 retention, BiPAP in use. Improved.  * Acute respiratory failure with hypercapnia and hypoxia.   BiPAP in use, secondary to CHF, treat underlying things.  * Acute on chronic systolic CHF (congestive heart failure) (HCC) - diuretics , required BiPAP due to her respiratory status,  cardiology consult, echocardiogram.  * Atrial flutter   Cardioversion in past.   Also had some asystole and junctional rhythm so not a candidate for beta blockers.   Hold her Eliquis as she may need cardiac procedure, cardiology suggested to start him keep on heparin IV drip for now.  *  HTN (hypertension) -continue home meds  * COPD (chronic obstructive pulmonary disease) (HCC) -home dose inhalers * Hypothyroidism -home dose thyroid replacement   All the records are reviewed and case discussed with Care Management/Social Workerr. Management plans discussed with the patient, family and they are in agreement.  CODE STATUS: Full.  TOTAL TIME TAKING CARE OF THIS PATIENT: 35 minutes.     POSSIBLE D/C IN 1-2 DAYS, DEPENDING ON CLINICAL CONDITION.   Altamese DillingVaibhavkumar Jaqueline Uber M.D on 11/08/2017   Between 7am to 6pm - Pager - [phone]  After 6pm go to [email or web address] - password [email or web address]  [facility] [place]  Office  [phone]  CC: Primary care physician; [facility]  Note: This dictation was prepared with Dragon dictation along with smaller phrase technology. Any transcriptional errors that result from this process are unintentional.

## 2017-11-08 NOTE — Progress Notes (Signed)
Placed patient back on bi-pap, notified MD Conforti of patients change in mental status. ABG ordered. Will continue to monitor patient.

## 2017-11-08 NOTE — Care Management Note (Signed)
Case Management Note  Patient Details  Name: Danielle Munoz MRN: [id number] Date of Birth: 03-01-1941  Subjective/Objective:                 Admitted to icu due to need for continuous bipap for sx related to heart failure. Has chronic home oxygen.  There are concerns she is not taking her meds. Chronic Eliquis   Action/Plan:   Expected Discharge Date:                  Expected Discharge Plan:     In-House Referral:     Discharge planning Services     Post Acute Care Choice:    Choice offered to:     DME Arranged:    DME Agency:     HH Arranged:    HH Agency:     Status of Service:     If discussed at [employer] Meetings, dates discussed:    Additional Comments:  Eber HongGreene, Selena Swaminathan R, RN 11/08/2017, 9:04 AM

## 2017-11-08 NOTE — Progress Notes (Addendum)
ANTICOAGULATION CONSULT NOTE   Pharmacy Consult for heparin drip management  Indication: Atrial Fibrillation   No Known Allergies  Patient Measurements: Height: 5' (152.4 cm) Weight: 273 lb 2.4 oz (123.9 kg) IBW/kg (Calculated) : 45.5 Heparin Dosing Weight: 80kg   Vital Signs: Temp: 97.8 F (36.6 C) (02/08 0900) Temp Source: Oral (02/08 0900) BP: 136/69 (02/08 1400) Pulse Rate: 63 (02/08 1400)  Labs: Recent Labs    11/07/17 2119 11/08/17 0129 11/08/17 0245 11/08/17 1336  HGB 9.6*  --  10.2*  --   HCT 34.6*  --  37.7  --   PLT 361  --  362  --   APTT  --   --   --  28  CREATININE 1.16* 1.06*  --   --   TROPONINI 0.04* 0.03*  0.03*  --  <0.03    Estimated Creatinine Clearance: 55.7 mL/min (A) (by C-G formula based on SCr of 1.06 mg/dL (H)).   Medical History: Past Medical History:  Diagnosis Date  . Atrial flutter (HCC)    a. s/p TEE/DCCV 10/18 @ [facility] complicated by asystole and junctional rhythm requiring epi, atropine, and CPR x < 1 minute; b. amio and Eliquis; c. CHADS2VASC => 6 (CHF, HTN, age x 2, vascular disease, female)  . Chronic combined systolic and diastolic CHF (congestive heart failure) (HCC)   . CKD (chronic kidney disease), stage II   . COPD (chronic obstructive pulmonary disease) (HCC)   . Hypertension   . Hypothyroidism   . Iron deficiency anemia   . Morbid obesity (HCC)   . Noncompliance     Medications:  Scheduled:  . budesonide (PULMICORT) nebulizer solution  0.25 mg Nebulization BID  . furosemide  20 mg Intravenous BID  . heparin  4,000 Units Intravenous Once  . levothyroxine  100 mcg Oral QAC breakfast  . methylPREDNISolone (SOLU-MEDROL) injection  60 mg Intravenous Q6H   Infusions:  . heparin    . levofloxacin (LEVAQUIN) IV Stopped (11/08/17 1356)    Assessment: Pharmacy consulted for heparin drip management for 77 yo female initiated on heparin drip for Atrial Fibrillation. Patient takes apixaban as an outpatient and has not  had a dose since being admitted.   Goal of Therapy:  Heparin level 0.3-0.7 units/ml aPTT 66-102  seconds Monitor platelets by anticoagulation protocol: Yes   Plan:  Will give heparin bolus of 4000 units and start patient on heparin 1150 units/hr. Will obtain follow aPTT 8 hours after the initiation of heparin. Will continue to manage via aPTT until anti-Xa levels correlate.   Pharmacy will continue to monitor and adjust per consult.   Simpson,Michael L 11/08/2017,2:18 PM    2/8 PM aPTT 80. Continue current regimen. Recheck aPTT, heparin level, and CBC with tomorrow AM labs.  2/9 AM heparin level 0.38, aPTT 86. Correlating in therapeutic range. Will monitor by heparin level alone. Heparin level and CBC ordered with tomorrow AM labs.  Fulton ReekMatt Wayland Baik, PharmD, BCPS  11/08/17 11:37 PM

## 2017-11-09 DIAGNOSIS — E662 Morbid (severe) obesity with alveolar hypoventilation: Secondary | ICD-10-CM

## 2017-11-09 DIAGNOSIS — J432 Centrilobular emphysema: Secondary | ICD-10-CM

## 2017-11-09 LAB — CBC
HEMATOCRIT: 34.1 % — AB (ref 35.0–47.0)
Hemoglobin: 9.5 g/dL — ABNORMAL LOW (ref 12.0–16.0)
MCH: 17.4 pg — ABNORMAL LOW (ref 26.0–34.0)
MCHC: 27.7 g/dL — AB (ref 32.0–36.0)
MCV: 62.9 fL — ABNORMAL LOW (ref 80.0–100.0)
PLATELETS: 357 10*3/uL (ref 150–440)
RBC: 5.43 MIL/uL — ABNORMAL HIGH (ref 3.80–5.20)
RDW: 21.3 % — AB (ref 11.5–14.5)
WBC: 7.2 10*3/uL (ref 3.6–11.0)

## 2017-11-09 LAB — BASIC METABOLIC PANEL
Anion gap: 7 (ref 5–15)
BUN: 27 mg/dL — AB (ref 6–20)
CALCIUM: 8.1 mg/dL — AB (ref 8.9–10.3)
CHLORIDE: 103 mmol/L (ref 101–111)
CO2: 34 mmol/L — AB (ref 22–32)
CREATININE: 1.01 mg/dL — AB (ref 0.44–1.00)
GFR calc Af Amer: >60 mL/min (ref >60)
GFR calc non Af Amer: 53 mL/min — ABNORMAL LOW (ref >60)
Glucose, Bld: 150 mg/dL — ABNORMAL HIGH (ref 65–99)
Potassium: 4.3 mmol/L (ref 3.5–5.1)
SODIUM: 144 mmol/L (ref 135–145)

## 2017-11-09 LAB — APTT: aPTT: 86 seconds — ABNORMAL HIGH (ref 24–36)

## 2017-11-09 LAB — HEPARIN LEVEL (UNFRACTIONATED): Heparin Unfractionated: 0.38 IU/mL (ref 0.30–0.70)

## 2017-11-09 MED ORDER — APIXABAN 5 MG PO TABS
5.0000 mg | ORAL_TABLET | Freq: Two times a day (BID) | ORAL | Status: DC
  Administered 2017-11-09 – 2017-11-11 (×5): 5 mg via ORAL
  Filled 2017-11-09 (×5): qty 1

## 2017-11-09 MED ORDER — METHYLPREDNISOLONE SODIUM SUCC 125 MG IJ SOLR
60.0000 mg | Freq: Two times a day (BID) | INTRAMUSCULAR | Status: DC
  Administered 2017-11-09 – 2017-11-10 (×2): 60 mg via INTRAVENOUS
  Filled 2017-11-09 (×2): qty 2

## 2017-11-09 NOTE — Progress Notes (Signed)
Bipap on standby at this time, pt on 4l [place] with saturations of 94%.

## 2017-11-09 NOTE — Progress Notes (Signed)
Pt accepted in transfer from [facility]. She is alert orient x 3 and painfree. External cather applied. Tele verified. Pt oriented to room.

## 2017-11-09 NOTE — Progress Notes (Signed)
Progress Note  Patient Name: Danielle Munoz Date of Encounter: 11/09/2017  Primary Cardiologist: No primary care provider on file.   Subjective   Sitting up in bed this morning, feels back to her baseline Had BiPAP support overnight in the ICU Reports that she sometimes uses nasal cannula oxygen at home A full breakfast this morning, no complaints  Inpatient Medications    Scheduled Meds: . apixaban  5 mg Oral BID  . budesonide (PULMICORT) nebulizer solution  0.25 mg Nebulization BID  . furosemide  40 mg Intravenous BID  . levothyroxine  100 mcg Oral QAC breakfast  . methylPREDNISolone (SOLU-MEDROL) injection  60 mg Intravenous Q12H   Continuous Infusions: . levofloxacin (LEVAQUIN) IV 750 mg (11/09/17 1235)   PRN Meds: acetaminophen **OR** acetaminophen, ipratropium-albuterol, ondansetron **OR** ondansetron (ZOFRAN) IV   Vital Signs    Vitals:   11/09/17 0735 11/09/17 0800 11/09/17 0900 11/09/17 1000  BP:  113/61 (!) 120/45 (!) 111/56  Pulse:  75 86 63  Resp:  (!) 29 (!) 21 19  Temp:  97.9 F (36.6 C)    TempSrc:  Oral    SpO2: 94% 94% 94% 95%  Weight:      Height:        Intake/Output Summary (Last 24 hours) at 11/09/2017 1418 Last data filed at 11/09/2017 1033 Gross per 24 hour  Intake 231.92 ml  Output -  Net 231.92 ml   Filed Weights   11/07/17 2033 11/08/17 0111 11/09/17 0500  Weight: (!) 375 lb (170.1 kg) 273 lb 2.4 oz (123.9 kg) 263 lb 0.1 oz (119.3 kg)    Telemetry    Normal sinus rhythm- Personally Reviewed  ECG      Physical Exam   GEN: No acute distress.  Morbidly obese on 2 L nasal cannula oxygen Neck: No JVD Cardiac: RRR, no murmurs, rubs, or gallops.  Respiratory:  Scattered Rales, dullness at the bases GI: Soft, nontender, non-distended  MS: No edema; No deformity. Neuro:  Nonfocal  Psych: Normal affect   Labs    Chemistry Recent Labs  Lab 11/07/17 2119 11/08/17 0129 11/09/17 0524  NA 147* 144 144  K 4.5 4.5 4.3  CL 109  107 103  CO2 29 32 34*  GLUCOSE 100* 132* 150*  BUN 26* 25* 27*  CREATININE 1.16* 1.06* 1.01*  CALCIUM 8.7* 8.2* 8.1*  GFRNONAA 45* 50* 53*  GFRAA 52* 58* >60  ANIONGAP 9 5 7      Hematology Recent Labs  Lab 11/07/17 2119 11/08/17 0245 11/09/17 0524  WBC 12.5* 12.9* 7.2  RBC 5.57* 6.03* 5.43*  HGB 9.6* 10.2* 9.5*  HCT 34.6* 37.7 34.1*  MCV 62.2* 62.5* 62.9*  MCH 17.3* 16.9* 17.4*  MCHC 27.8* 27.1* 27.7*  RDW 21.3* 21.6* 21.3*  PLT 361 362 357    Cardiac Enzymes Recent Labs  Lab 11/07/17 2119 11/08/17 0129 11/08/17 1336  TROPONINI 0.04* 0.03*  0.03* <0.03   No results for input(s): TROPIPOC in the last 168 hours.   BNP Recent Labs  Lab 11/07/17 2119  BNP 1,362.0*     DDimer No results for input(s): DDIMER in the last 168 hours.   Radiology    Dg Chest 1 View  Result Date: 11/07/2017 CLINICAL DATA:  Shortness of breath. EXAM: CHEST 1 VIEW COMPARISON:  August 19, 2011 FINDINGS: The mediastinal contour is normal. The heart size is enlarged. There is pulmonary edema. There is no focal pneumonia or pleural effusion. Bony structures are stable. IMPRESSION: Congestive  heart failure. Electronically Signed   By: Sherian ReinWei-Chen  Lin M.D.   On: 11/07/2017 20:27   [place] Venous Img Upper Uni Left  Result Date: 11/08/2017 CLINICAL DATA:  Left arm swelling EXAM: LEFT UPPER EXTREMITY VENOUS DOPPLER ULTRASOUND TECHNIQUE: Gray-scale sonography with graded compression, as well as color Doppler and duplex ultrasound were performed to evaluate the upper extremity deep venous system from the level of the subclavian vein and including the jugular, axillary, basilic, radial, ulnar and upper cephalic vein. Spectral Doppler was utilized to evaluate flow at rest and with distal augmentation maneuvers. COMPARISON:  None. FINDINGS: Contralateral Subclavian Vein: Respiratory phasicity is normal and symmetric with the symptomatic side. No evidence of thrombus. Normal compressibility. Internal Jugular  Vein: No evidence of thrombus. Normal compressibility, respiratory phasicity and response to augmentation. Subclavian Vein: No evidence of thrombus. Normal compressibility, respiratory phasicity and response to augmentation. Axillary Vein: No evidence of thrombus. Normal compressibility, respiratory phasicity and response to augmentation. Cephalic Vein: No evidence of thrombus. Normal compressibility, respiratory phasicity and response to augmentation. Basilic Vein: No evidence of thrombus. Normal compressibility, respiratory phasicity and response to augmentation. Brachial Veins: No evidence of thrombus. Normal compressibility, respiratory phasicity and response to augmentation. Radial Veins: No evidence of thrombus. Normal compressibility, respiratory phasicity and response to augmentation. Ulnar Veins: No evidence of thrombus. Normal compressibility, respiratory phasicity and response to augmentation. Venous Reflux:  None visualized. Other Findings:  None visualized. IMPRESSION: No evidence of DVT within the left upper extremity. Electronically Signed   By: Alcide CleverMark  Lukens M.D.   On: 11/08/2017 11:45   Dg Chest Port 1 View  Result Date: 11/08/2017 CLINICAL DATA:  Follow-up pulmonary edema EXAM: PORTABLE CHEST 1 VIEW COMPARISON:  11/07/2017 FINDINGS: Cardiac shadow remains enlarged. Vascular congestion is again noted. Increasing left-sided pleural effusion is noted. No acute bony abnormality is seen. IMPRESSION: Increasing left basilar pleural effusion. Stable vascular congestion. Electronically Signed   By: Alcide CleverMark  Lukens M.D.   On: 11/08/2017 07:01    Cardiac Studies     Patient Profile     Danielle Munoz is a 77 y.o. female with a hx of chronic combined CHF, atrial flutter s/p TEE/DCCV 07/2017 complicated by asystole with junctional rhythm requiring epi, atropine, and COPR for < 1 minute on Eliquis and amiodarone with uncertain compliance, COPD possibly on supplemental oxygen (details unclear), iron  deficiency anemia, hypothyroidism, CKD stage II, morbid obesity with possible OSA/OHS, vasovagal syncope, bilateral lower extremity edema, HTN, diverticulosis, and recurrent ventral hernia  presenting with acute on chronic combined CHF     Assessment & Plan     1. Acute on possible chronic respiratory failure with hypoxia and hypercapnia: AECOPD, OSA/OHS, morbid obesity, acute on chronic diastolic CHF Improved mentation after BiPAP and IV Lasix   2. Acute on chronic diastolic CHF weight that is up at least 33 pounds from her 07/22/2017 visit at [facility] (240-->273 pounds) Would continue Lasix IV with monitoring of renal function -Hold off on beta blocker at this time given history of junctional rhythm in 07/2017 Blood pressure low, could consider adding very low-dose losartan  3. Elevated troponin: -Likely supply demand ischemia in the setting of the above This is not a non-STEMI, no further ischemic workup needed at this time  4. Atrial flutter s/p DCCV 07/2017 complicated by asystole and junctional rhythm: -Currently in sinus rhythm -Continue amiodarone  -Not on BB as above  on Eliquis given CHADS2VASC of at least 6 (CHF, HTN, age x 2, vascular disease, female).  5. Morbid obesity with possible OSA/OHS:  Consider PFTs/spirometry testing prior to discharge May be best to send home with BiPAP if she qualifies to minimize risk of readmission   Total encounter time more than 35 minutes  Greater than 50% was spent in counseling and coordination of care with the patient   For questions or updates, please contact [facility] Please consult [email or web address] for contact info under Cardiology/STEMI.      Signed, Julien Nordmann, MD  11/09/2017, 2:18 PM

## 2017-11-09 NOTE — Progress Notes (Signed)
[facility]  Critical Care Medicine Progess Note    SYNOPSIS   65F with PMH as below significant for HFpEF and COPD who presents with acute on chronic dyspnea as well as weakness. CXR with pulmonary vascular congestion, BNP elevated at 1,300. Was treated with lasix, albuterol, then developed worsening respiratory distress requiring NIV. Pt is admitted to ICU.  ASSESSMENT/PLAN   Patient with acute on chronic hypercapnic respiratory failure most likely reflects prior etiology to include congestive heart failure, COPD, obesity/hypoventilation syndrome. Tolerated being off of NIV without any suppressed mental status. No wheezing appreciated on exam this morning. Will decrease Solu-Medrol, 5 day course of Levaquin, albuterol, Atrovent, nasal cannula supplemental oxygen  History of atrial flutter/fibrillation. Will start Eliquis and DC heparin infusion  Metabolic alkalosis. Most likely chronic secondary to both chronic respiratory acidosis and diuretic therapy  Prerenal indices  Anemia. No evidence of active bleeding  VENTILATOR SETTINGS: FiO2 (%):  [35 %] 35 %  INTAKE / OUTPUT:  Intake/Output Summary (Last 24 hours) at 11/09/2017 0755 Last data filed at 11/09/2017 0500 Gross per 24 hour  Intake 318.09 ml  Output -  Net 318.09 ml   Name: Danielle Munoz MRN: [id number] DOB: 11/18/1940    ADMISSION DATE:  11/07/2017  SUBJECTIVE:   Patient much more awake and alert this morning, sleeping without NIV and arouses easily  VITAL SIGNS: Temp:  [97.8 F (36.6 C)-98.5 F (36.9 C)] 98.1 F (36.7 C) (02/09 0300) Pulse Rate:  [60-81] 78 (02/09 0600) Resp:  [13-26] 26 (02/09 0600) BP: (100-154)/(35-94) 107/94 (02/09 0600) SpO2:  [86 %-99 %] 94 % (02/09 0735) FiO2 (%):  [35 %] 35 % (02/08 1956) Weight:  [263 lb 0.1 oz (119.3 kg)] 263 lb 0.1 oz (119.3 kg) (02/09 0500)  PHYSICAL EXAMINATION: Physical Examination:   VS: BP (!) 107/94   Pulse 78   Temp 98.1 F (36.7 C) (Axillary)   Resp  (!) 26   Ht 5' (1.524 m)   Wt 263 lb 0.1 oz (119.3 kg)   SpO2 94%   BMI 51.37 kg/m   General Appearance: No distress  Neuro:without focal findings, mental status normal.. Pulmonary: Scant basilar crackles appreciated  CardiovascularNormal S1,S2.  Abdomen: Benign, Soft, non-tender. Skin:   warm, no rashes, no ecchymosis  Extremities: normal, no cyanosis, clubbing.  LABORATORY PANEL:   CBC Recent Labs  Lab 11/09/17 0524  WBC 7.2  HGB 9.5*  HCT 34.1*  PLT 357    Chemistries  Recent Labs  Lab 11/09/17 0524  NA 144  K 4.3  CL 103  CO2 34*  GLUCOSE 150*  BUN 27*  CREATININE 1.01*  CALCIUM 8.1*    Recent Labs  Lab 11/08/17 0110  GLUCAP 137*   Recent Labs  Lab 11/08/17 0908 11/08/17 1545  PHART 7.24* 7.27*  PCO2ART 81* 78*  PO2ART 85 71*   No results for input(s): AST, ALT, ALKPHOS, BILITOT, ALBUMIN in the last 168 hours.  Cardiac Enzymes Recent Labs  Lab 11/08/17 1336  TROPONINI <0.03    RADIOLOGY:  Dg Chest 1 View  Result Date: 11/07/2017 CLINICAL DATA:  Shortness of breath. EXAM: CHEST 1 VIEW COMPARISON:  August 19, 2011 FINDINGS: The mediastinal contour is normal. The heart size is enlarged. There is pulmonary edema. There is no focal pneumonia or pleural effusion. Bony structures are stable. IMPRESSION: Congestive heart failure. Electronically Signed   By: Sherian Rein M.D.   On: 11/07/2017 20:27   US Venous Img Upper Uni Left  Result Date:  11/08/2017 CLINICAL DATA:  Left arm swelling EXAM: LEFT UPPER EXTREMITY VENOUS DOPPLER ULTRASOUND TECHNIQUE: Gray-scale sonography with graded compression, as well as color Doppler and duplex ultrasound were performed to evaluate the upper extremity deep venous system from the level of the subclavian vein and including the jugular, axillary, basilic, radial, ulnar and upper cephalic vein. Spectral Doppler was utilized to evaluate flow at rest and with distal augmentation maneuvers. COMPARISON:  None. FINDINGS:  Contralateral Subclavian Vein: Respiratory phasicity is normal and symmetric with the symptomatic side. No evidence of thrombus. Normal compressibility. Internal Jugular Vein: No evidence of thrombus. Normal compressibility, respiratory phasicity and response to augmentation. Subclavian Vein: No evidence of thrombus. Normal compressibility, respiratory phasicity and response to augmentation. Axillary Vein: No evidence of thrombus. Normal compressibility, respiratory phasicity and response to augmentation. Cephalic Vein: No evidence of thrombus. Normal compressibility, respiratory phasicity and response to augmentation. Basilic Vein: No evidence of thrombus. Normal compressibility, respiratory phasicity and response to augmentation. Brachial Veins: No evidence of thrombus. Normal compressibility, respiratory phasicity and response to augmentation. Radial Veins: No evidence of thrombus. Normal compressibility, respiratory phasicity and response to augmentation. Ulnar Veins: No evidence of thrombus. Normal compressibility, respiratory phasicity and response to augmentation. Venous Reflux:  None visualized. Other Findings:  None visualized. IMPRESSION: No evidence of DVT within the left upper extremity. Electronically Signed   By: Alcide CleverMark  Lukens M.D.   On: 11/08/2017 11:45   Dg Chest Port 1 View  Result Date: 11/08/2017 CLINICAL DATA:  Follow-up pulmonary edema EXAM: PORTABLE CHEST 1 VIEW COMPARISON:  11/07/2017 FINDINGS: Cardiac shadow remains enlarged. Vascular congestion is again noted. Increasing left-sided pleural effusion is noted. No acute bony abnormality is seen. IMPRESSION: Increasing left basilar pleural effusion. Stable vascular congestion. Electronically Signed   By: Alcide CleverMark  Lukens M.D.   On: 11/08/2017 07:01   Tora KindredJohn Pihu Basil, DO 2/9/2019[name], female   DOB: 01/20/1941, 77 y.o.   MRN: [id number]

## 2017-11-09 NOTE — Progress Notes (Signed)
ANTICOAGULATION CONSULT NOTE   Pharmacy Consult for Apixaban  Indication: Atrial Fibrillation   No Known Allergies  Patient Measurements: Height: 5' (152.4 cm) Weight: 263 lb 0.1 oz (119.3 kg) IBW/kg (Calculated) : 45.5 Heparin Dosing Weight: 80kg   Vital Signs: Temp: 98.1 F (36.7 C) (02/09 0300) Temp Source: Axillary (02/09 0300) BP: 107/94 (02/09 0600) Pulse Rate: 78 (02/09 0600)  Labs: Recent Labs    11/07/17 2119 11/08/17 0129 11/08/17 0245 11/08/17 1336 11/08/17 2217 11/09/17 0524  HGB 9.6*  --  10.2*  --   --  9.5*  HCT 34.6*  --  37.7  --   --  34.1*  PLT 361  --  362  --   --  357  APTT  --   --   --  28 80* 86*  HEPARINUNFRC  --   --   --  <0.10*  --  0.38  CREATININE 1.16* 1.06*  --   --   --  1.01*  TROPONINI 0.04* 0.03*  0.03*  --  <0.03  --   --     Estimated Creatinine Clearance: 57 mL/min (A) (by C-G formula based on SCr of 1.01 mg/dL (H)).   Medical History: Past Medical History:  Diagnosis Date  . Atrial flutter (HCC)    a. s/p TEE/DCCV 10/18 @ [facility] complicated by asystole and junctional rhythm requiring epi, atropine, and CPR x < 1 minute; b. amio and Eliquis; c. CHADS2VASC => 6 (CHF, HTN, age x 2, vascular disease, female)  . Chronic combined systolic and diastolic CHF (congestive heart failure) (HCC)   . CKD (chronic kidney disease), stage II   . COPD (chronic obstructive pulmonary disease) (HCC)   . Hypertension   . Hypothyroidism   . Iron deficiency anemia   . Morbid obesity (HCC)   . Noncompliance     Medications:  Scheduled:  . apixaban  5 mg Oral BID  . budesonide (PULMICORT) nebulizer solution  0.25 mg Nebulization BID  . furosemide  40 mg Intravenous BID  . levothyroxine  100 mcg Oral QAC breakfast  . methylPREDNISolone (SOLU-MEDROL) injection  60 mg Intravenous Q12H   Infusions:  . heparin 1,150 Units/hr (11/09/17 0626)  . levofloxacin (LEVAQUIN) IV Stopped (11/08/17 1356)    Assessment: Pharmacy consulted for heparin  drip management for 77 yo female initiated on heparin drip for Atrial Fibrillation. Patient to transition back to apixaban 2/9.   Plan:  Stop heparin drip and resume apixaban 5 mg bid concurrently at 1000.   Pharmacy will continue to monitor and adjust per consult.   Luisa HartChristy, Liliana Brentlinger D 11/09/2017,8:10 AM

## 2017-11-09 NOTE — Progress Notes (Signed)
bipap on standby at this time, pt on 4l [place] and tol well.

## 2017-11-09 NOTE — Progress Notes (Signed)
[employer] [facility] [place] [facility]   PATIENT NAME: Danielle Munoz    MR#:  [id number]  DATE OF BIRTH:  09-13-1941  SUBJECTIVE:  CHIEF COMPLAINT:   Chief Complaint  Patient presents with  . Leg Swelling    REVIEW OF SYSTEMS:  Review of Systems  Constitutional: Positive for malaise/fatigue. Negative for chills and fever.  HENT: Negative for congestion, ear discharge, hearing loss and nosebleeds.   Eyes: Negative for blurred vision and double vision.  Respiratory: Positive for shortness of breath. Negative for cough and wheezing.   Cardiovascular: Positive for orthopnea and leg swelling. Negative for chest pain and palpitations.  Gastrointestinal: Negative for abdominal pain, constipation, diarrhea, nausea and vomiting.  Genitourinary: Negative for dysuria.  Musculoskeletal: Negative for myalgias.  Neurological: Negative for dizziness, speech change, focal weakness, seizures and headaches.    DRUG ALLERGIES:  No Known Allergies  VITALS:  Blood pressure (!) 107/94, pulse 78, temperature 98.1 F (36.7 C), temperature source Axillary, resp. rate (!) 26, height 5' (1.524 m), weight 119.3 kg (263 lb 0.1 oz), SpO2 (!) 86 %.  PHYSICAL EXAMINATION:  Physical Exam  GENERAL:  77 y.o.-year-old obese  patient lying in the bed with no acute distress.  EYES: Pupils equal, round, reactive to light and accommodation. No scleral icterus. Extraocular muscles intact.  HEENT: Head atraumatic, normocephalic. Oropharynx and nasopharynx clear.  NECK:  Supple, no jugular venous distention. No thyroid enlargement, no tenderness.  LUNGS: Normal breath sounds bilaterally, no wheezing,rhonchi or crepitation. No use of accessory muscles of respiration. Fine bibasilar crackles still present. CARDIOVASCULAR: S1, S2 normal. No  rubs, or gallops. 2/6 systolic murmur present ABDOMEN: Soft, obese, nontender, nondistended. Bowel sounds present. No organomegaly or mass.  EXTREMITIES: No  cyanosis, or clubbing. 2+ bilateral pedal edema present NEUROLOGIC: Cranial nerves II through XII are intact. Muscle strength 5/5 in all extremities. Sensation intact. Gait not checked. Global weakness noted PSYCHIATRIC: The patient is alert and oriented x 3.  SKIN: No obvious rash, lesion, or ulcer.    LABORATORY PANEL:   CBC Recent Labs  Lab 11/09/17 0524  WBC 7.2  HGB 9.5*  HCT 34.1*  PLT 357   ------------------------------------------------------------------------------------------------------------------  Chemistries  Recent Labs  Lab 11/09/17 0524  NA 144  K 4.3  CL 103  CO2 34*  GLUCOSE 150*  BUN 27*  CREATININE 1.01*  CALCIUM 8.1*   ------------------------------------------------------------------------------------------------------------------  Cardiac Enzymes Recent Labs  Lab 11/08/17 1336  TROPONINI <0.03   ------------------------------------------------------------------------------------------------------------------  RADIOLOGY:  Dg Chest 1 View  Result Date: 11/07/2017 CLINICAL DATA:  Shortness of breath. EXAM: CHEST 1 VIEW COMPARISON:  August 19, 2011 FINDINGS: The mediastinal contour is normal. The heart size is enlarged. There is pulmonary edema. There is no focal pneumonia or pleural effusion. Bony structures are stable. IMPRESSION: Congestive heart failure. Electronically Signed   By: Sherian Rein M.D.   On: 11/07/2017 20:27   US Venous Img Upper Uni Left  Result Date: 11/08/2017 CLINICAL DATA:  Left arm swelling EXAM: LEFT UPPER EXTREMITY VENOUS DOPPLER ULTRASOUND TECHNIQUE: Gray-scale sonography with graded compression, as well as color Doppler and duplex ultrasound were performed to evaluate the upper extremity deep venous system from the level of the subclavian vein and including the jugular, axillary, basilic, radial, ulnar and upper cephalic vein. Spectral Doppler was utilized to evaluate flow at rest and with distal augmentation  maneuvers. COMPARISON:  None. FINDINGS: Contralateral Subclavian Vein: Respiratory phasicity is normal and symmetric with the symptomatic side. No  evidence of thrombus. Normal compressibility. Internal Jugular Vein: No evidence of thrombus. Normal compressibility, respiratory phasicity and response to augmentation. Subclavian Vein: No evidence of thrombus. Normal compressibility, respiratory phasicity and response to augmentation. Axillary Vein: No evidence of thrombus. Normal compressibility, respiratory phasicity and response to augmentation. Cephalic Vein: No evidence of thrombus. Normal compressibility, respiratory phasicity and response to augmentation. Basilic Vein: No evidence of thrombus. Normal compressibility, respiratory phasicity and response to augmentation. Brachial Veins: No evidence of thrombus. Normal compressibility, respiratory phasicity and response to augmentation. Radial Veins: No evidence of thrombus. Normal compressibility, respiratory phasicity and response to augmentation. Ulnar Veins: No evidence of thrombus. Normal compressibility, respiratory phasicity and response to augmentation. Venous Reflux:  None visualized. Other Findings:  None visualized. IMPRESSION: No evidence of DVT within the left upper extremity. Electronically Signed   By: Alcide CleverMark  Lukens M.D.   On: 11/08/2017 11:45   Dg Chest Port 1 View  Result Date: 11/08/2017 CLINICAL DATA:  Follow-up pulmonary edema EXAM: PORTABLE CHEST 1 VIEW COMPARISON:  11/07/2017 FINDINGS: Cardiac shadow remains enlarged. Vascular congestion is again noted. Increasing left-sided pleural effusion is noted. No acute bony abnormality is seen. IMPRESSION: Increasing left basilar pleural effusion. Stable vascular congestion. Electronically Signed   By: Alcide CleverMark  Lukens M.D.   On: 11/08/2017 07:01    EKG:   Orders placed or performed during the hospital encounter of 11/07/17  . ED EKG  . ED EKG  . EKG 12-Lead  . EKG 12-Lead  . EKG    ASSESSMENT  AND PLAN:   77 year old female with known history of chronic diastolic heart failure, atrial flutter status post TEE cardioversion in October 2018, on eliquis, COPD, CK D and obesity presents to hospital secondary to worsening shortness of breath.  1. Acute hypoxic respiratory failure-due to acute on chronic diastolic CHF exacerbation. -At home uses when necessary 2 L oxygen. Required BiPAP on admission. -Currently off of BiPAP, still on 4 L oxygen. Continue IV Lasix twice a day year -Appreciate cardiology consult -Also on steroids and Levaquin. Lungs are clear, can taper steroids today -Chest x-ray with left pleural effusion. No infiltrate noted.  2. Atrial flutter-status post successful cardioversion, currently in sinus rhythm. -Due to inability to take oral medications-was started on IV heparin drip. -Changed to eliquis whenever able to take oral medications. Off beta blocker due to history of acute decompensation with that. -Continue amiodarone.  3. Anemia of chronic disease-hemoglobin is stable. Monitor at this time  4. Hypothyroidism-continue Synthroid  5. DVT prophylaxis-already on IV heparin  Physical therapy consult today   All the records are reviewed and case discussed with Care Management/Social Workerr. Management plans discussed with the patient, family and they are in agreement.  CODE STATUS: Full Code  TOTAL TIME TAKING CARE OF THIS PATIENT: 38 minutes.   POSSIBLE D/C IN 2 DAYS, DEPENDING ON CLINICAL CONDITION.   Mykaylah Ballman M.D on 11/09/2017 at 7:37 AM  Between 7am to 6pm - Pager - [phone]  After 6pm go to [email or web address] - Social research officer, governmentpassword EPAS [facility]  [facility]  Office  [phone]  CC: Primary care physician; Clinic, [facility]

## 2017-11-10 DIAGNOSIS — R609 Edema, unspecified: Secondary | ICD-10-CM

## 2017-11-10 DIAGNOSIS — I509 Heart failure, unspecified: Secondary | ICD-10-CM

## 2017-11-10 DIAGNOSIS — R0603 Acute respiratory distress: Secondary | ICD-10-CM

## 2017-11-10 LAB — CBC
HEMATOCRIT: 36.7 % (ref 35.0–47.0)
HEMOGLOBIN: 10.2 g/dL — AB (ref 12.0–16.0)
MCH: 17.3 pg — AB (ref 26.0–34.0)
MCHC: 27.8 g/dL — AB (ref 32.0–36.0)
MCV: 62.2 fL — ABNORMAL LOW (ref 80.0–100.0)
Platelets: 373 10*3/uL (ref 150–440)
RBC: 5.9 MIL/uL — AB (ref 3.80–5.20)
RDW: 21.4 % — ABNORMAL HIGH (ref 11.5–14.5)
WBC: 14.1 10*3/uL — ABNORMAL HIGH (ref 3.6–11.0)

## 2017-11-10 MED ORDER — LEVOFLOXACIN 500 MG PO TABS
500.0000 mg | ORAL_TABLET | Freq: Every day | ORAL | Status: DC
  Administered 2017-11-10 – 2017-11-11 (×2): 500 mg via ORAL
  Filled 2017-11-10 (×2): qty 1

## 2017-11-10 MED ORDER — HALOPERIDOL LACTATE 5 MG/ML IJ SOLN
1.0000 mg | Freq: Once | INTRAMUSCULAR | Status: AC
  Administered 2017-11-10: 1 mg via INTRAMUSCULAR
  Filled 2017-11-10: qty 1

## 2017-11-10 MED ORDER — SODIUM CHLORIDE 0.9% FLUSH
3.0000 mL | INTRAVENOUS | Status: DC | PRN
  Administered 2017-11-10: 3 mL via INTRAVENOUS
  Filled 2017-11-10: qty 3

## 2017-11-10 MED ORDER — PREDNISONE 50 MG PO TABS
50.0000 mg | ORAL_TABLET | Freq: Every day | ORAL | Status: DC
  Administered 2017-11-10 – 2017-11-11 (×2): 50 mg via ORAL
  Filled 2017-11-10 (×2): qty 1

## 2017-11-10 MED ORDER — GUAIFENESIN-DM 100-10 MG/5ML PO SYRP
5.0000 mL | ORAL_SOLUTION | ORAL | Status: DC | PRN
  Administered 2017-11-10: 5 mL via ORAL
  Filled 2017-11-10: qty 5

## 2017-11-10 MED ORDER — SODIUM CHLORIDE 0.9% FLUSH
3.0000 mL | Freq: Two times a day (BID) | INTRAVENOUS | Status: DC
  Administered 2017-11-10 – 2017-11-11 (×4): 3 mL via INTRAVENOUS

## 2017-11-10 NOTE — Progress Notes (Signed)
Progress Note  Patient Name: Danielle Munoz Date of Encounter: 11/10/2017  Primary Cardiologist: Kateri Mcuke  Subjective   Moved to 2A, sitting up in recliner, no complaints  full breakfast this morning Making significant urine Breathing still not at her baseline but much better 1.7 negative past 24 hoursl  Additional full bucket on the wall from [place]   Inpatient Medications    Scheduled Meds: . apixaban  5 mg Oral BID  . budesonide (PULMICORT) nebulizer solution  0.25 mg Nebulization BID  . furosemide  40 mg Intravenous BID  . levofloxacin  500 mg Oral Daily  . levothyroxine  100 mcg Oral QAC breakfast  . predniSONE  50 mg Oral Q breakfast  . sodium chloride flush  3 mL Intravenous Q12H   Continuous Infusions:  PRN Meds: acetaminophen **OR** acetaminophen, guaiFENesin-dextromethorphan, ipratropium-albuterol, ondansetron **OR** ondansetron (ZOFRAN) IV, sodium chloride flush   Vital Signs    Vitals:   11/09/17 1500 11/09/17 1600 11/09/17 1927 11/10/17 0329  BP: (!) 117/52 (!) 103/45 (!) 173/47 (!) 163/64  Pulse: 77 78 73 75  Resp: (!) 26 15 17 18   Temp:   (!) 97.5 F (36.4 C) 98.5 F (36.9 C)  TempSrc:   Oral Oral  SpO2: 94% 97% 97% 97%  Weight:    265 lb (120.2 kg)  Height:        Intake/Output Summary (Last 24 hours) at 11/10/2017 1406 Last data filed at 11/10/2017 1028 Gross per 24 hour  Intake 240 ml  Output 2000 ml  Net -1760 ml   Filed Weights   11/08/17 0111 11/09/17 0500 11/10/17 0329  Weight: 273 lb 2.4 oz (123.9 kg) 263 lb 0.1 oz (119.3 kg) 265 lb (120.2 kg)    Telemetry    Normal sinus rhythm- Personally Reviewed  ECG      Physical Exam   Constitutional:  oriented to person, place, and time. No distress. Morbidly obese HENT:  Head: Normocephalic and atraumatic.  Eyes:  no discharge. No scleral icterus.  Neck: Normal range of motion. Neck supple. No JVD present.  Cardiovascular: Normal rate, regular rhythm, normal heart sounds and intact  distal pulses. Exam reveals no gallop and no friction rub.  Trace nonpitting lower extremity edema No murmur heard. Pulmonary/Chest: Scattered Rales, dullness at the bases  Abdominal: Soft.  no distension.  no tenderness.  Musculoskeletal: Normal range of motion.  no  tenderness or deformity.  Neurological:  normal muscle tone. Coordination normal. No atrophy Skin: Skin is warm and dry. No rash noted. not diaphoretic.  Psychiatric:  normal mood and affect. behavior is normal. Thought content normal.      Labs    Chemistry Recent Labs  Lab 11/07/17 2119 11/08/17 0129 11/09/17 0524  NA 147* 144 144  K 4.5 4.5 4.3  CL 109 107 103  CO2 29 32 34*  GLUCOSE 100* 132* 150*  BUN 26* 25* 27*  CREATININE 1.16* 1.06* 1.01*  CALCIUM 8.7* 8.2* 8.1*  GFRNONAA 45* 50* 53*  GFRAA 52* 58* >60  ANIONGAP 9 5 7      Hematology Recent Labs  Lab 11/08/17 0245 11/09/17 0524 11/10/17 0608  WBC 12.9* 7.2 14.1*  RBC 6.03* 5.43* 5.90*  HGB 10.2* 9.5* 10.2*  HCT 37.7 34.1* 36.7  MCV 62.5* 62.9* 62.2*  MCH 16.9* 17.4* 17.3*  MCHC 27.1* 27.7* 27.8*  RDW 21.6* 21.3* 21.4*  PLT 362 357 373    Cardiac Enzymes Recent Labs  Lab 11/07/17 2119 11/08/17 0129 11/08/17 1336  TROPONINI 0.04* 0.03*  0.03* <0.03   No results for input(s): TROPIPOC in the last 168 hours.   BNP Recent Labs  Lab 11/07/17 2119  BNP 1,362.0*     DDimer No results for input(s): DDIMER in the last 168 hours.   Radiology    No results found.  Cardiac Studies     Patient Profile     Danielle Munoz is a 77 y.o. female with a hx of chronic combined CHF, atrial flutter s/p TEE/DCCV 07/2017 complicated by asystole with junctional rhythm requiring epi, atropine, and COPR for < 1 minute on Eliquis and amiodarone with uncertain compliance, COPD possibly on supplemental oxygen (details unclear), iron deficiency anemia, hypothyroidism, CKD stage II, morbid obesity with possible OSA/OHS, vasovagal syncope, bilateral  lower extremity edema, HTN, diverticulosis, and recurrent ventral hernia  presenting with acute on chronic combined CHF     Assessment & Plan     1. Acute on possible chronic respiratory failure with hypoxia and hypercapnia: AECOPD, OSA/OHS, morbid obesity, acute on chronic diastolic CHF Improved mentation after BiPAP and IV Lasix  Would continue IV Lasix for now with close monitoring of renal function  2. Acute on chronic diastolic CHF weight that is up at least 33 pounds from her 07/22/2017 visit at [facility] (240-->273 pounds) Continue IV Lasix  beta blocker on hold given history of junctional rhythm in 07/2017  low-dose losartan if blood pressure tolerates  3. Elevated troponin: -Likely supply demand ischemia in the setting of the above This is not a non-STEMI, no further ischemic workup needed at this time  4. Atrial flutter s/p DCCV 07/2017 complicated by asystole and junctional rhythm:  in sinus rhythm, Continue amiodarone  -Not on BB as above  on Eliquis given CHADS2VASC of at least 6 (CHF, HTN, age x 2, vascular disease, female).  High risk of DVT and PE  5. Morbid obesity with possible OSA/OHS: Close outpatient follow-up for sleep study  follow-up with pulmonary   Total encounter time more than 25 minutes  Greater than 50% was spent in counseling and coordination of care with the patient   For questions or updates, please contact [facility] Please consult [email or web address] for contact info under Cardiology/STEMI.      Signed, Julien Nordmann, MD  11/10/2017, 2:06 PM

## 2017-11-10 NOTE — Evaluation (Signed)
Physical Therapy Evaluation Patient Details Name: Danielle Munoz MRN: [id number] DOB: 1941/04/10 Today's Date: 11/10/2017   History of Present Illness  pt is a 77 y.o F admitted on 11/07/2017 with Dx of Acute on chronic CHF. She has a PMHx of CHR, COPD, HTN, MI and throid disease. She was admitted due to severe SOB and leg swelling  Clinical Impression  Pt was sitting on EOB undergoing a breathing treatment when PT entered the room. She is A&O x 4 and denies any pain. She currently lives In a single wide trailer with ~8 steps to get in and lives her her 12 y.o son. She required MOD I using RW for sit <> stand and transfers to recliner. She was able to amb ~20 ft in room with RW but fatigues quickly and desatted to 85% and required verbal cues on breathing techniques and appropriate use of DME. She was able to perform exercise while sitting in recliner but continued to require verbal cues for breathing due to O2 dropping to 88%. She plans to return back home upon discharged and would benefit from continued physical therapy via home health, and benefit from getting a RW for safety. She would benefit from physical therapy to improve walking/ standing endurance, use of DME for safety, functional mobility to maximize her function at home.     Follow Up Recommendations Home health PT    Equipment Recommendations  Rolling walker with 5" wheels    Recommendations for Other Services       Precautions / Restrictions Precautions Precautions: None Restrictions Weight Bearing Restrictions: No      Mobility  Bed Mobility Overal bed mobility: Independent             General bed mobility comments: pt was sitting on EOB for a breathing treatment  when PT session started  Transfers Overall transfer level: Modified independent Equipment used: Rolling walker (2 wheeled)             General transfer comment: pt reports she has no equipment at home so she walks using the wall or furniture for  support  Ambulation/Gait Ambulation/Gait assistance: Modified independent (Device/Increase time) Ambulation Distance (Feet): 20 Feet(in room) Assistive device: Rolling walker (2 wheeled) Gait Pattern/deviations: Step-through pattern;Decreased stride length;Trendelenburg;Antalgic;Trunk flexed     General Gait Details: verbal cues required for standing up straight and walking in the RW, and appropriate pursed lipped breathing due to saturation dropping to 85%  Stairs            Wheelchair Mobility    Modified Rankin (Stroke Patients Only)       Balance                                             Pertinent Vitals/Pain Pain Assessment: No/denies pain    Home Living Family/patient expects to be discharged to:: Private residence Living Arrangements: Children Available Help at Discharge: Family;Available PRN/intermittently Type of Home: Mobile home Home Access: Stairs to enter Entrance Stairs-Rails: Can reach both Entrance Stairs-Number of Steps: 8 Home Layout: One level Home Equipment: None      Prior Function Level of Independence: Independent         Comments: currently has no AD at home     Hand Dominance   Dominant Hand: Right    Extremity/Trunk Assessment   Upper Extremity Assessment Upper Extremity Assessment: Overall [facility]  for tasks assessed    Lower Extremity Assessment Lower Extremity Assessment: Overall WFL for tasks assessed       Communication   Communication: No difficulties  Cognition Arousal/Alertness: Awake/alert Behavior During Therapy: WFL for tasks assessed/performed Overall Cognitive Status: Within Functional Limits for tasks assessed                                        General Comments      Exercises Other Exercises Other Exercises: ankle pumps 1 x 10 Other Exercises: seated marching 1 x 10 Other Exercises: SAQ 1 x 10 alternating L/R Other Exercises: glute set 1 x 10    Assessment/Plan    PT Assessment Patient needs continued PT services  PT Problem List Decreased strength;Decreased activity tolerance;Decreased balance;Decreased mobility;Decreased knowledge of use of DME       PT Treatment Interventions Gait training;Stair training;Therapeutic activities;Therapeutic exercise;DME instruction;Functional mobility training    PT Goals (Current goals can be found in the Care Plan section)  Acute Rehab PT Goals Patient Stated Goal: to go home PT Goal Formulation: With patient Time For Goal Achievement: 11/24/17 Potential to Achieve Goals: Good    Frequency Min 1X/week   Barriers to discharge        Co-evaluation               AM-PAC PT "6 Clicks" Daily Activity  Outcome Measure Difficulty turning over in bed (including adjusting bedclothes, sheets and blankets)?: None Difficulty moving from lying on back to sitting on the side of the bed? : None Difficulty sitting down on and standing up from a chair with arms (e.g., wheelchair, bedside commode, etc,.)?: A Lot Help needed moving to and from a bed to chair (including a wheelchair)?: A Lot Help needed walking in hospital room?: A Lot Help needed climbing 3-5 steps with a railing? : Total 6 Click Score: 15    End of Session Equipment Utilized During Treatment: Gait belt;Oxygen(O2  3 LPM) Activity Tolerance: Patient limited by fatigue;Patient limited by lethargy Patient left: in chair;with chair alarm set;with call bell/phone within reach Nurse Communication: Mobility status PT Visit Diagnosis: Unsteadiness on feet (R26.81);Muscle weakness (generalized) (M62.81);Ataxic gait (R26.0);Other abnormalities of gait and mobility (R26.89)    Time: [id number] PT Time Calculation (min) (ACUTE ONLY): 32 min   Charges:   PT Evaluation $PT Eval Moderate Complexity: 1 Mod PT Treatments $Gait Training: 8-22 mins $Therapeutic Exercise: 8-22 mins   PT G Codes:        Marguetta Windish PT, DPT,  LAT, ATC  11/10/17  1:14 PM       Lauria Depoy 11/10/2017, 1:10 PM

## 2017-11-10 NOTE — Progress Notes (Signed)
[facility] [place] [facility]   PATIENT NAME: Marica OtterMarian Solomon    MR#:  [id number]  DATE OF BIRTH:  Jun 29, 1941  SUBJECTIVE:  CHIEF COMPLAINT:   Chief Complaint  Patient presents with  . Leg Swelling   - Off BiPAP, half nasal cannula oxygen. Looks much better and feels great. -Physical therapy consult pending  REVIEW OF SYSTEMS:  Review of Systems  Constitutional: Positive for malaise/fatigue. Negative for chills and fever.  HENT: Negative for congestion, ear discharge, hearing loss and nosebleeds.   Eyes: Negative for blurred vision and double vision.  Respiratory: Positive for shortness of breath. Negative for cough and wheezing.   Cardiovascular: Positive for orthopnea. Negative for chest pain, palpitations and leg swelling.  Gastrointestinal: Negative for abdominal pain, constipation, diarrhea, nausea and vomiting.  Genitourinary: Negative for dysuria.  Musculoskeletal: Negative for myalgias.  Neurological: Negative for dizziness, speech change, focal weakness, seizures and headaches.    DRUG ALLERGIES:  No Known Allergies  VITALS:  Blood pressure (!) 163/64, pulse 75, temperature 98.5 F (36.9 C), temperature source Oral, resp. rate 18, height 5' (1.524 m), weight 120.2 kg (265 lb), SpO2 97 %.  PHYSICAL EXAMINATION:  Physical Exam  GENERAL:  77 y.o.-year-old obese  Patient sitting in the chair with no acute distress.  EYES: Pupils equal, round, reactive to light and accommodation. No scleral icterus. Extraocular muscles intact.  HEENT: Head atraumatic, normocephalic. Oropharynx and nasopharynx clear.  NECK:  Supple, no jugular venous distention. No thyroid enlargement, no tenderness.  LUNGS: Normal breath sounds bilaterally, no wheezing,rhonchi or crepitation. No use of accessory muscles of respiration. Fine bibasilar crackles still present. CARDIOVASCULAR: S1, S2 normal. No  rubs, or gallops. 2/6 systolic murmur present ABDOMEN: Soft, obese,  nontender, nondistended. Bowel sounds present. No organomegaly or mass.  EXTREMITIES: No cyanosis, or clubbing. 2+ bilateral pedal edema present NEUROLOGIC: Cranial nerves II through XII are intact. Muscle strength 5/5 in all extremities. Sensation intact. Gait not checked. Global weakness noted PSYCHIATRIC: The patient is alert and oriented x 3.  SKIN: No obvious rash, lesion, or ulcer.    LABORATORY PANEL:   CBC Recent Labs  Lab 11/10/17 0608  WBC 14.1*  HGB 10.2*  HCT 36.7  PLT 373   ------------------------------------------------------------------------------------------------------------------  Chemistries  Recent Labs  Lab 11/09/17 0524  NA 144  K 4.3  CL 103  CO2 34*  GLUCOSE 150*  BUN 27*  CREATININE 1.01*  CALCIUM 8.1*   ------------------------------------------------------------------------------------------------------------------  Cardiac Enzymes Recent Labs  Lab 11/08/17 1336  TROPONINI <0.03   ------------------------------------------------------------------------------------------------------------------  RADIOLOGY:  [place] Venous Img Upper Uni Left  Result Date: 11/08/2017 CLINICAL DATA:  Left arm swelling EXAM: LEFT UPPER EXTREMITY VENOUS DOPPLER ULTRASOUND TECHNIQUE: Gray-scale sonography with graded compression, as well as color Doppler and duplex ultrasound were performed to evaluate the upper extremity deep venous system from the level of the subclavian vein and including the jugular, axillary, basilic, radial, ulnar and upper cephalic vein. Spectral Doppler was utilized to evaluate flow at rest and with distal augmentation maneuvers. COMPARISON:  None. FINDINGS: Contralateral Subclavian Vein: Respiratory phasicity is normal and symmetric with the symptomatic side. No evidence of thrombus. Normal compressibility. Internal Jugular Vein: No evidence of thrombus. Normal compressibility, respiratory phasicity and response to augmentation. Subclavian Vein:  No evidence of thrombus. Normal compressibility, respiratory phasicity and response to augmentation. Axillary Vein: No evidence of thrombus. Normal compressibility, respiratory phasicity and response to augmentation. Cephalic Vein: No evidence of thrombus. Normal compressibility, respiratory phasicity  and response to augmentation. Basilic Vein: No evidence of thrombus. Normal compressibility, respiratory phasicity and response to augmentation. Brachial Veins: No evidence of thrombus. Normal compressibility, respiratory phasicity and response to augmentation. Radial Veins: No evidence of thrombus. Normal compressibility, respiratory phasicity and response to augmentation. Ulnar Veins: No evidence of thrombus. Normal compressibility, respiratory phasicity and response to augmentation. Venous Reflux:  None visualized. Other Findings:  None visualized. IMPRESSION: No evidence of DVT within the left upper extremity. Electronically Signed   By: Alcide Clever M.D.   On: 11/08/2017 11:45    EKG:   Orders placed or performed during the hospital encounter of 11/07/17  . ED EKG  . ED EKG  . EKG 12-Lead  . EKG 12-Lead  . EKG    ASSESSMENT AND PLAN:   77 year old female with known history of chronic diastolic heart failure, atrial flutter status post TEE cardioversion in October 2018, on eliquis, COPD, CK D and obesity presents to hospital secondary to worsening shortness of breath.  1. Acute hypoxic respiratory failure-due to acute on chronic diastolic CHF exacerbation. -At home uses when necessary 2 L oxygen. Required BiPAP on admission. -Currently off of BiPAP, off nasal cannula. - Continue IV Lasix twice a day today and change to oral tomorrow -Appreciate cardiology consult -Also on steroids and Levaquin. Lungs are clear, can taper steroids today -Chest x-ray with left pleural effusion. No infiltrate noted.  2. Atrial flutter-status post successful cardioversion in oct 2018, currently in sinus  rhythm. -On eliquis for anticoagulation. . Off beta blocker due to history of acute decompensation with that. -Continue amiodarone.  3. Anemia of chronic disease-hemoglobin is stable. Monitor at this time  4. Hypothyroidism-continue Synthroid  5. DVT prophylaxis- on eliquis  6. Obesity- has likely OSA or obesity hypoventilation- will need sleep study as outpatient and possible cpap at bedtime  Physical therapy consulted Called daughter- unable to reach her   All the records are reviewed and case discussed with Care Management/Social Workerr. Management plans discussed with the patient, family and they are in agreement.  CODE STATUS: Full Code  TOTAL TIME TAKING CARE OF THIS PATIENT: 37 minutes.   POSSIBLE D/C  TOMORROW, DEPENDING ON CLINICAL CONDITION.   Enid Baas M.D on 11/10/2017 at 8:23 AM  Between 7am to 6pm - Pager - [phone]  After 6pm go to [email or web address] - Social research officer, government  [facility]  Office  [phone]  CC: Primary care physician; Clinic, [facility]

## 2017-11-10 NOTE — Progress Notes (Signed)
Pt uncooperative.  Impulsive with getting out of chair.  Refusing to return to bed.  Incontinent of urine, cursing at staff with attempts to bathe and give pericare.  MD made aware.  Order given for Haldol.

## 2017-11-11 LAB — BASIC METABOLIC PANEL
Anion gap: 9 (ref 5–15)
BUN: 29 mg/dL — AB (ref 6–20)
CHLORIDE: 94 mmol/L — AB (ref 101–111)
CO2: 42 mmol/L — AB (ref 22–32)
CREATININE: 0.85 mg/dL (ref 0.44–1.00)
Calcium: 8.2 mg/dL — ABNORMAL LOW (ref 8.9–10.3)
GFR calc non Af Amer: >60 mL/min (ref >60)
Glucose, Bld: 99 mg/dL (ref 65–99)
Potassium: 3.4 mmol/L — ABNORMAL LOW (ref 3.5–5.1)
Sodium: 145 mmol/L (ref 135–145)

## 2017-11-11 MED ORDER — AMIODARONE HCL 200 MG PO TABS
200.0000 mg | ORAL_TABLET | Freq: Two times a day (BID) | ORAL | Status: DC
  Administered 2017-11-11: 200 mg via ORAL
  Filled 2017-11-11: qty 1

## 2017-11-11 MED ORDER — HYDROCOD POLST-CPM POLST ER 10-8 MG/5ML PO SUER: 5.0000 mL | Freq: Two times a day (BID) | ORAL | 0 refills | Status: DC | PRN

## 2017-11-11 MED ORDER — LISINOPRIL 20 MG PO TABS: 20.0000 mg | ORAL_TABLET | Freq: Every day | ORAL | 2 refills | Status: DC

## 2017-11-11 MED ORDER — LISINOPRIL 10 MG PO TABS
10.0000 mg | ORAL_TABLET | Freq: Every day | ORAL | Status: DC
  Administered 2017-11-11: 10 mg via ORAL
  Filled 2017-11-11: qty 1

## 2017-11-11 MED ORDER — POTASSIUM CHLORIDE CRYS ER 20 MEQ PO TBCR
40.0000 meq | EXTENDED_RELEASE_TABLET | Freq: Once | ORAL | Status: AC
  Administered 2017-11-11: 40 meq via ORAL
  Filled 2017-11-11: qty 2

## 2017-11-11 MED ORDER — LEVOFLOXACIN 500 MG PO TABS: 500.0000 mg | ORAL_TABLET | Freq: Every day | ORAL | 0 refills | Status: AC

## 2017-11-11 MED ORDER — PREDNISONE 10 MG (21) PO TBPK: ORAL_TABLET | ORAL | 0 refills | Status: DC

## 2017-11-11 MED ORDER — FUROSEMIDE 40 MG PO TABS: 40.0000 mg | ORAL_TABLET | Freq: Every day | ORAL | 2 refills | Status: DC

## 2017-11-11 MED ORDER — AMIODARONE HCL 200 MG PO TABS: 200.0000 mg | ORAL_TABLET | Freq: Two times a day (BID) | ORAL | 2 refills | Status: DC

## 2017-11-11 NOTE — Progress Notes (Signed)
SATURATION QUALIFICATIONS: (This note is used to comply with regulatory documentation for home oxygen)  Patient Saturations on Room Air at Rest 78%   Patient Saturations at Rest 2 liters- 92%  Patient Saturations on 2Liters of oxygen while Ambulating  92%   Please briefly explain why patient needs home oxygen:

## 2017-11-11 NOTE — Care Management Important Message (Signed)
Important Message  Patient Details  Name: Danielle Munoz MRN: [id number] Date of Birth: [birth year]   Medicare Important Message Given:  Yes  Signed IM notice given   Eber HongGreene, Abiageal Blowe R, RN 11/11/2017, 2:42 PM

## 2017-11-11 NOTE — Discharge Summary (Addendum)
[employer] -  at [facility]   PATIENT NAME: Danielle Munoz    MR#:  [id number]  DATE OF BIRTH:  Mar 04, 1941  DATE OF ADMISSION:  11/07/2017   ADMITTING PHYSICIAN: Oralia Manisavid Willis, MD  DATE OF DISCHARGE: 11/11/17  PRIMARY CARE PHYSICIAN: Clinic, [facility]   ADMISSION DIAGNOSIS:   Peripheral edema [R60.9] COPD exacerbation (HCC) [J44.1] Acute on chronic congestive heart failure, unspecified heart failure type (HCC) [I50.9]  DISCHARGE DIAGNOSIS:   Principal Problem:   Acute on chronic systolic CHF (congestive heart failure) (HCC) Active Problems:   Hypothyroidism   HTN (hypertension)   COPD (chronic obstructive pulmonary disease) (HCC)   Acute on chronic diastolic heart failure (HCC)   SECONDARY DIAGNOSIS:   Past Medical History:  Diagnosis Date  . Atrial flutter (HCC)    a. s/p TEE/DCCV 10/18 @ [facility] complicated by asystole and junctional rhythm requiring epi, atropine, and CPR x < 1 minute; b. amio and Eliquis; c. CHADS2VASC => 6 (CHF, HTN, age x 2, vascular disease, female)  . Chronic combined systolic and diastolic CHF (congestive heart failure) (HCC)   . CKD (chronic kidney disease), stage II   . COPD (chronic obstructive pulmonary disease) (HCC)   . Hypertension   . Hypothyroidism   . Iron deficiency anemia   . Morbid obesity (HCC)   . Noncompliance     HOSPITAL COURSE:   77 year old female with known history of chronic diastolic heart failure, atrial flutter status post TEE cardioversion in October 2018, on eliquis, COPD, CK D and obesity presents to hospital secondary to worsening shortness of breath.  1. Acute hypoxic respiratory failure-due to acute on chronic diastolic CHF exacerbation. -At home uses when necessary 2 L oxygen. Required BiPAP on admission. -Currently off of BiPAP, off nasal cannula. - received IV Lasix twice a day- was down by 19 pounds since admission - at home- was taking prn lasix- change to daily 40mg   lasix -Appreciate cardiology consult -Also on steroids and Levaquin. Lungs are clear, taper steroids at discharge. -Chest x-ray with left pleural effusion. No infiltrate noted.  2. Atrial flutter-status post successful cardioversion in oct 2018, currently in sinus rhythm. -On eliquis for anticoagulation. . Off beta blocker due to history of acute decompensation with that. -started on amiodarone. Bid for 1 week and then qdaily  3. Anemia of chronic disease-hemoglobin is stable. Monitor at this time  4. Hypothyroidism-continue Synthroid  5. Obesity- has likely OSA or obesity hypoventilation- will need sleep study as outpatient and possible cpap at bedtime  6. HTN- started lisinopril at discharge  Physical therapy consulted, recommended home health    DISCHARGE CONDITIONS:   Guarded  CONSULTS OBTAINED:   Treatment Team:  Iran OuchArida, Muhammad A, MD  DRUG ALLERGIES:   No Known Allergies DISCHARGE MEDICATIONS:   Allergies as of 11/11/2017   No Known Allergies     Medication List    TAKE these medications   amiodarone 200 MG tablet Commonly known as:  PACERONE Take 1 tablet (200 mg total) by mouth 2 (two) times daily. Take 1 tablet twice a day for 1 week and then 1 tablet once a day   chlorpheniramine-HYDROcodone 10-8 MG/5ML Suer Commonly known as:  TUSSIONEX PENNKINETIC ER Take 5 mLs by mouth every 12 (twelve) hours as needed for cough.   ELIQUIS 5 MG Tabs tablet Generic drug:  apixaban Take 5 mg by mouth 2 (two) times daily.   furosemide 40 MG tablet Commonly known as:  LASIX Take 1 tablet (  40 mg total) by mouth daily. What changed:    medication strength  how much to take  when to take this  reasons to take this   levofloxacin 500 MG tablet Commonly known as:  LEVAQUIN Take 1 tablet (500 mg total) by mouth daily for 4 days. X 4 more days Start taking on:  11/12/2017   levothyroxine 100 MCG tablet Commonly known as:  SYNTHROID, LEVOTHROID Take 100  mcg by mouth daily.   lisinopril 20 MG tablet Commonly known as:  PRINIVIL,ZESTRIL Take 1 tablet (20 mg total) by mouth daily. Start taking on:  11/12/2017   predniSONE 10 MG (21) Tbpk tablet Commonly known as:  STERAPRED UNI-PAK 21 TAB 6 tabs PO x 1 day 5 tabs PO x 1 day 4 tabs PO x 1 day 3 tabs PO x 1 day 2 tabs PO x 1 day 1 tab PO x 1 day and stop        DISCHARGE INSTRUCTIONS:   1. PCP f/u in 1 week 2. Cardiology f/u in 2 weeks 3. Low salt diet  DIET:   Cardiac diet  ACTIVITY:   Activity as tolerated  OXYGEN:   Home Oxygen: Yes.    Oxygen Delivery: continuous 2L o2 via nasal cannula  DISCHARGE LOCATION:   home   If you experience worsening of your admission symptoms, develop shortness of breath, life threatening emergency, suicidal or homicidal thoughts you must seek medical attention immediately by calling 911 or calling your MD immediately  if symptoms less severe.  You Must read complete instructions/literature along with all the possible adverse reactions/side effects for all the Medicines you take and that have been prescribed to you. Take any new Medicines after you have completely understood and accpet all the possible adverse reactions/side effects.   Please note  You were cared for by a hospitalist during your hospital stay. If you have any questions about your discharge medications or the care you received while you were in the hospital after you are discharged, you can call the unit and asked to speak with the hospitalist on call if the hospitalist that took care of you is not available. Once you are discharged, your primary care physician will handle any further medical issues. Please note that NO REFILLS for any discharge medications will be authorized once you are discharged, as it is imperative that you return to your primary care physician (or establish a relationship with a primary care physician if you do not have one) for your aftercare needs so  that they can reassess your need for medications and monitor your lab values.    On the day of Discharge:  VITAL SIGNS:   Blood pressure (!) 183/76, pulse 74, temperature 98.3 F (36.8 C), resp. rate 19, height 5' (1.524 m), weight 116.5 kg (256 lb 12.8 oz), SpO2 98 %.  PHYSICAL EXAMINATION:    GENERAL:  77 y.o.-year-old obese  Patient sitting in the chair with no acute distress.  EYES: Pupils equal, round, reactive to light and accommodation. No scleral icterus. Extraocular muscles intact.  HEENT: Head atraumatic, normocephalic. Oropharynx and nasopharynx clear.  NECK:  Supple, no jugular venous distention. No thyroid enlargement, no tenderness.  LUNGS: Normal breath sounds bilaterally, no wheezing,rhonchi or crepitation. No use of accessory muscles of respiration. Fine bibasilar crackles still present. CARDIOVASCULAR: S1, S2 normal. No  rubs, or gallops. 2/6 systolic murmur present ABDOMEN: Soft, obese, nontender, nondistended. Bowel sounds present. No organomegaly or mass.  EXTREMITIES: No cyanosis, or clubbing.  1+ bilateral pedal edema present NEUROLOGIC: Cranial nerves II through XII are intact. Muscle strength 5/5 in all extremities. Sensation intact. Gait not checked. Global weakness noted PSYCHIATRIC: The patient is alert and oriented x 3.  SKIN: No obvious rash, lesion, or ulcer.    DATA REVIEW:   CBC Recent Labs  Lab 11/10/17 0608  WBC 14.1*  HGB 10.2*  HCT 36.7  PLT 373    Chemistries  Recent Labs  Lab 11/11/17 0500  NA 145  K 3.4*  CL 94*  CO2 42*  GLUCOSE 99  BUN 29*  CREATININE 0.85  CALCIUM 8.2*     Microbiology Results  Results for orders placed or performed during the hospital encounter of 11/07/17  MRSA PCR Screening     Status: None   Collection Time: 11/08/17  1:01 AM  Result Value Ref Range Status   MRSA by PCR NEGATIVE NEGATIVE Final    Comment:        The GeneXpert MRSA Assay (FDA approved for NASAL specimens only), is one component  of a comprehensive MRSA colonization surveillance program. It is not intended to diagnose MRSA infection nor to guide or monitor treatment for MRSA infections. Performed at [facility], [address], [place], [place] [address]     RADIOLOGY:  No results found.   Management plans discussed with the patient, family and they are in agreement.  CODE STATUS:     Code Status Orders  (From admission, onward)        Start     Ordered   11/08/17 0103  Full code  Continuous     11/08/17 0102    Code Status History    Date Active Date Inactive Code Status Order ID Comments User Context   This patient has a current code status but no historical code status.      TOTAL TIME TAKING CARE OF THIS PATIENT: 38 minutes.    Enid Baas M.D on 11/11/2017 at 2:44 PM  Between 7am to 6pm - Pager - [phone]  After 6pm go to [email or web address] - Social research officer, government  [facility]  Office  [phone]  CC: Primary care physician; Clinic[facility]   Note: This dictation was prepared with Dragon dictation along with smaller phrase technology. Any transcriptional errors that result from this process are unintentional.

## 2017-11-11 NOTE — Care Management (Signed)
patient for discharge home today.  She again informs CM she has oxygen and portable tanks but "I wear it when I want to." Her son informed primary nurse patient does not have portable tanks.  CM contacted [employer] and informed the order on file is nocturnal only.  Primary nurse will perform home 02 assessment.  Patient is agreeable to home health. Agency preference is [facility].  Referral called to [facility] OT [facility] and accepted.  Patient has qualified for continuous home oxygen.  Obtained order and notified [facility] of need for portable tank for transport home

## 2017-11-11 NOTE — Progress Notes (Signed)
Progress Note  Patient Name: Danielle Munoz Date of Encounter: 11/11/2017  Primary Cardiologist: [facility]  Subjective   SOB improving. No chest pain. - 720 mL for the past 24 hours and net - 3.2 L for the admission. Weight down to 256 this morning (admission weight 273). Renal function improving. Blood pressure remains elevated into the 180s systolic this morning. Alert.   Inpatient Medications    Scheduled Meds: . apixaban  5 mg Oral BID  . budesonide (PULMICORT) nebulizer solution  0.25 mg Nebulization BID  . furosemide  40 mg Intravenous BID  . levofloxacin  500 mg Oral Daily  . levothyroxine  100 mcg Oral QAC breakfast  . predniSONE  50 mg Oral Q breakfast  . sodium chloride flush  3 mL Intravenous Q12H   Continuous Infusions:  PRN Meds: acetaminophen **OR** acetaminophen, guaiFENesin-dextromethorphan, ipratropium-albuterol, ondansetron **OR** ondansetron (ZOFRAN) IV, sodium chloride flush   Vital Signs    Vitals:   11/10/17 2008 11/10/17 2130 11/11/17 0608 11/11/17 0806  BP: (!) 186/155 (!) 182/82 (!) 158/70 (!) 183/76  Pulse: 77 89 80 74  Resp: 18  18 19   Temp: 98.2 F (36.8 C)  98.3 F (36.8 C) 98.3 F (36.8 C)  TempSrc: Oral  Oral   SpO2: 95% 94% 95% 98%  Weight:   256 lb 12.8 oz (116.5 kg)   Height:        Intake/Output Summary (Last 24 hours) at 11/11/2017 0811 Last data filed at 11/10/2017 2008 Gross per 24 hour  Intake 480 ml  Output 1100 ml  Net -620 ml   Filed Weights   11/09/17 0500 11/10/17 0329 11/11/17 0608  Weight: 263 lb 0.1 oz (119.3 kg) 265 lb (120.2 kg) 256 lb 12.8 oz (116.5 kg)    Telemetry    NSR - Personally Reviewed  ECG    n/a - Personally Reviewed  Physical Exam   GEN: No acute distress.   Neck: JVD difficult to assess 2/2 body habitus. Cardiac: RRR, no murmurs, rubs, or gallops.  Respiratory: Diminished breath sounds bilaterally with diffuse expiratory wheezing.  GI: Soft, nontender, non-distended.   MS: 1-2+  bilateral lower extremity edema with mild erythema; No deformity. Neuro:  Alert and oriented x 3; Nonfocal.  Psych: Normal affect.  Labs    Chemistry Recent Labs  Lab 11/08/17 0129 11/09/17 0524 11/11/17 0500  NA 144 144 145  K 4.5 4.3 3.4*  CL 107 103 94*  CO2 32 34* 42*  GLUCOSE 132* 150* 99  BUN 25* 27* 29*  CREATININE 1.06* 1.01* 0.85  CALCIUM 8.2* 8.1* 8.2*  GFRNONAA 50* 53* >60  GFRAA 58* >60 >60  ANIONGAP 5 7 9      Hematology Recent Labs  Lab 11/08/17 0245 11/09/17 0524 11/10/17 0608  WBC 12.9* 7.2 14.1*  RBC 6.03* 5.43* 5.90*  HGB 10.2* 9.5* 10.2*  HCT 37.7 34.1* 36.7  MCV 62.5* 62.9* 62.2*  MCH 16.9* 17.4* 17.3*  MCHC 27.1* 27.7* 27.8*  RDW 21.6* 21.3* 21.4*  PLT 362 357 373    Cardiac Enzymes Recent Labs  Lab 11/07/17 2119 11/08/17 0129 11/08/17 1336  TROPONINI 0.04* 0.03*  0.03* <0.03   No results for input(s): TROPIPOC in the last 168 hours.   BNP Recent Labs  Lab 11/07/17 2119  BNP 1,362.0*     DDimer No results for input(s): DDIMER in the last 168 hours.   Radiology    No results found.  Cardiac Studies   TTE 11/08/17: Study  Conclusions  - Procedure narrative: Transthoracic echocardiography. The study   was technically difficult. - Left ventricle: The cavity size was normal. There was mild   concentric hypertrophy. Systolic function was normal. The   estimated ejection fraction was in the range of 60% to 65%. Wall   motion was normal; there were no regional wall motion   abnormalities. The study is not technically sufficient to allow   evaluation of LV diastolic function. - Left atrium: The atrium was mildly dilated. - Pulmonary arteries: Systolic pressure could not be accurately   estimated.  Patient Profile     77 y.o. female with history of chronic combined CHF, atrial flutter s/p TEE/DCCV 07/2017 complicated by asystole with junctional rhythm requiring epi, atropine, and CPR for < 1 minute on Eliquis and amiodarone  with uncertain compliance, COPD possibly on supplemental oxygen (details unclear), iron deficiency anemia, hypothyroidism, CKD stage II, morbid obesity with possible OSA/OHS, vasovagal syncope, bilateral lower extremity edema, HTN, diverticulosis, and recurrent ventral hernia presenting with acute on chronic diastolic CHF.  Assessment & Plan    1. Acute on possible chronic respiratory failure with hypoxia and hypercapnia: -Likely multifactorial including AECOPD, OSA/OHS, morbid obesity, and acute on chronic diastolic CHF  -Mentation improved on BiPAP, now on nasal cannula -ABX, steroids, nebs per IM -Management of oxygen per IM  2. Acute on chronic diastolic CHF: -Weight down 273-->256 pounds this admission -Remains volume overloaded -Continue IV Lasix 40 mg bid with KCl repletion to goal > 4.0 -Daily weights -Strict Is and Os  3. Elevated troponin: -Likely supply demand ischemia in the setting of the above -This is not a non-STEMI, no further ischemic workup needed at this time -On Eliquis in place of ASA  4. Atrial flutter s/p DCCV 07/2017 complicated by asystole and junctional rhythm: -Currently in sinus rhythm -High risk of redeveloping arrhythmia given acute illness and pulmonary status -Not currently on amiodarone, will resume at 200 mg bid x 1 week, then 200 mg daily there after -Not on BB/CCB given prior history of junctional rhythm  -Eliquis given CHADS2VASC of at least 6 (CHF, HTN, age x 2, vascular disease, female)   5. Accelerated hypertension: -Blood pressures remain elevated into the 180s systolic -Add lisinopril 10 mg daily -Avoid beta blockers given history of junctional rhythm -Continue IV Lasix as above  6. Morbid obesity with possible PSA/OHS: -Weight loss advised -Outpatient sleep study advised  7. Leukocytosis: -Likely in the setting of her above illness and steroid use -Per IM  For questions or updates, please contact [facility] Please consult  [email or web address] for contact info under Cardiology/STEMI.    Signed, Eula Listen, PA-C [facility] Pager: [phone] 11/11/2017, 8:11 AM

## 2017-11-11 NOTE — Plan of Care (Signed)
Slept after IM Haldol given earlier in shift.  Arouses easily, cooperative with staff on awakening.

## 2017-11-14 ENCOUNTER — Ambulatory Visit: Payer: Medicare Other | Admitting: Family

## 2017-11-14 ENCOUNTER — Telehealth: Payer: Self-pay | Admitting: Family

## 2017-11-14 NOTE — Progress Notes (Deleted)
   [name], female    DOB: Jul 25, 1941, 77 y.o.   MRN: [id number]  HPI  Danielle Munoz is a 77 y/o female with a history of  Echo report from 11/08/17 reviewed and showed an EF of 60-65%.   Admitted 11/07/17 due to HF exacerbation and atrial flutter. Cardiology consult obtained. Initially needed IV diuretics and then transitioned to oral diuretics with a net loss of 19 pounds. Initially needed bipap and then transitioned to oxygen and then onto room air. Was given antibiotics and steroid taper and discharged home after 4 days.   She presents today for her initial visit with a chief complaint of   Review of Systems    Physical Exam    Assessment & Plan:  1: Chronic heart failure with preserved ejection fraction- - NYHA class - BNP on 11/07/17 was 1362.0  2: HTN- - BMP from 11/11/17 reviewed and showed sodium 145, potassium 3.4 and GFR >60  3: Atrial flutter- - cardioverted in 2018  4: COPD-

## 2017-11-14 NOTE — Telephone Encounter (Signed)
Patient did not show for her [facility] appointment on 11/14/17. Will attempt to reschedule.

## 2017-11-26 ENCOUNTER — Ambulatory Visit: Payer: Medicare Other | Admitting: Nurse Practitioner

## 2018-09-12 ENCOUNTER — Emergency Department: Payer: Medicare Other

## 2018-09-12 ENCOUNTER — Encounter: Payer: Self-pay | Admitting: *Deleted

## 2018-09-12 ENCOUNTER — Inpatient Hospital Stay
Admission: EM | Admit: 2018-09-12 | Discharge: 2018-09-15 | DRG: 871 | Disposition: A | Discharge status: Home Health | Payer: Medicare Other | Attending: Internal Medicine | Admitting: Internal Medicine

## 2018-09-12 ENCOUNTER — Other Ambulatory Visit: Payer: Self-pay

## 2018-09-12 DIAGNOSIS — X58XXXA Exposure to other specified factors, initial encounter: Secondary | ICD-10-CM | POA: Diagnosis present

## 2018-09-12 DIAGNOSIS — A419 Sepsis, unspecified organism: Secondary | ICD-10-CM | POA: Diagnosis present

## 2018-09-12 DIAGNOSIS — J9610 Chronic respiratory failure, unspecified whether with hypoxia or hypercapnia: Secondary | ICD-10-CM | POA: Diagnosis present

## 2018-09-12 DIAGNOSIS — I872 Venous insufficiency (chronic) (peripheral): Secondary | ICD-10-CM | POA: Diagnosis present

## 2018-09-12 DIAGNOSIS — I5043 Acute on chronic combined systolic (congestive) and diastolic (congestive) heart failure: Secondary | ICD-10-CM | POA: Diagnosis present

## 2018-09-12 DIAGNOSIS — Z803 Family history of malignant neoplasm of breast: Secondary | ICD-10-CM

## 2018-09-12 DIAGNOSIS — S91202A Unspecified open wound of left great toe with damage to nail, initial encounter: Secondary | ICD-10-CM | POA: Diagnosis present

## 2018-09-12 DIAGNOSIS — L03116 Cellulitis of left lower limb: Secondary | ICD-10-CM | POA: Diagnosis present

## 2018-09-12 DIAGNOSIS — Z825 Family history of asthma and other chronic lower respiratory diseases: Secondary | ICD-10-CM

## 2018-09-12 DIAGNOSIS — Z8249 Family history of ischemic heart disease and other diseases of the circulatory system: Secondary | ICD-10-CM

## 2018-09-12 DIAGNOSIS — E039 Hypothyroidism, unspecified: Secondary | ICD-10-CM | POA: Diagnosis present

## 2018-09-12 DIAGNOSIS — I499 Cardiac arrhythmia, unspecified: Secondary | ICD-10-CM | POA: Diagnosis not present

## 2018-09-12 DIAGNOSIS — E860 Dehydration: Secondary | ICD-10-CM | POA: Diagnosis not present

## 2018-09-12 DIAGNOSIS — L03119 Cellulitis of unspecified part of limb: Secondary | ICD-10-CM

## 2018-09-12 DIAGNOSIS — L03115 Cellulitis of right lower limb: Secondary | ICD-10-CM | POA: Diagnosis present

## 2018-09-12 DIAGNOSIS — J449 Chronic obstructive pulmonary disease, unspecified: Secondary | ICD-10-CM | POA: Diagnosis present

## 2018-09-12 DIAGNOSIS — L97219 Non-pressure chronic ulcer of right calf with unspecified severity: Secondary | ICD-10-CM | POA: Diagnosis not present

## 2018-09-12 DIAGNOSIS — M793 Panniculitis, unspecified: Secondary | ICD-10-CM | POA: Diagnosis present

## 2018-09-12 DIAGNOSIS — Z6841 Body Mass Index (BMI) 40.0 and over, adult: Secondary | ICD-10-CM

## 2018-09-12 DIAGNOSIS — Y92009 Unspecified place in unspecified non-institutional (private) residence as the place of occurrence of the external cause: Secondary | ICD-10-CM | POA: Diagnosis not present

## 2018-09-12 DIAGNOSIS — Z79899 Other long term (current) drug therapy: Secondary | ICD-10-CM | POA: Diagnosis not present

## 2018-09-12 DIAGNOSIS — Z79890 Hormone replacement therapy (postmenopausal): Secondary | ICD-10-CM

## 2018-09-12 DIAGNOSIS — R600 Localized edema: Secondary | ICD-10-CM | POA: Diagnosis not present

## 2018-09-12 DIAGNOSIS — I130 Hypertensive heart and chronic kidney disease with heart failure and stage 1 through stage 4 chronic kidney disease, or unspecified chronic kidney disease: Secondary | ICD-10-CM | POA: Diagnosis present

## 2018-09-12 DIAGNOSIS — Z87891 Personal history of nicotine dependence: Secondary | ICD-10-CM | POA: Diagnosis not present

## 2018-09-12 DIAGNOSIS — L97229 Non-pressure chronic ulcer of left calf with unspecified severity: Secondary | ICD-10-CM | POA: Diagnosis not present

## 2018-09-12 DIAGNOSIS — N182 Chronic kidney disease, stage 2 (mild): Secondary | ICD-10-CM | POA: Diagnosis present

## 2018-09-12 DIAGNOSIS — I519 Heart disease, unspecified: Secondary | ICD-10-CM | POA: Diagnosis not present

## 2018-09-12 DIAGNOSIS — I509 Heart failure, unspecified: Secondary | ICD-10-CM | POA: Diagnosis not present

## 2018-09-12 DIAGNOSIS — Z9114 Patient's other noncompliance with medication regimen: Secondary | ICD-10-CM | POA: Diagnosis not present

## 2018-09-12 DIAGNOSIS — L03311 Cellulitis of abdominal wall: Secondary | ICD-10-CM | POA: Diagnosis present

## 2018-09-12 DIAGNOSIS — Z7901 Long term (current) use of anticoagulants: Secondary | ICD-10-CM

## 2018-09-12 DIAGNOSIS — I10 Essential (primary) hypertension: Secondary | ICD-10-CM | POA: Diagnosis not present

## 2018-09-12 LAB — CBC WITH DIFFERENTIAL/PLATELET
Abs Immature Granulocytes: 0.08 10*3/uL — ABNORMAL HIGH (ref 0.00–0.07)
Basophils Absolute: 0.1 10*3/uL (ref 0.0–0.1)
Basophils Relative: 1 %
EOS ABS: 0.4 10*3/uL (ref 0.0–0.5)
Eosinophils Relative: 3 %
HEMATOCRIT: 39.7 % (ref 36.0–46.0)
HEMOGLOBIN: 11.1 g/dL — AB (ref 12.0–15.0)
Immature Granulocytes: 1 %
LYMPHS ABS: 1 10*3/uL (ref 0.7–4.0)
LYMPHS PCT: 8 %
MCH: 20.9 pg — AB (ref 26.0–34.0)
MCHC: 28 g/dL — AB (ref 30.0–36.0)
MCV: 74.6 fL — AB (ref 80.0–100.0)
MONO ABS: 1.1 10*3/uL — AB (ref 0.1–1.0)
MONOS PCT: 9 %
Neutro Abs: 9.6 10*3/uL — ABNORMAL HIGH (ref 1.7–7.7)
Neutrophils Relative %: 78 %
Platelets: 353 10*3/uL (ref 150–400)
RBC: 5.32 MIL/uL — ABNORMAL HIGH (ref 3.87–5.11)
RDW: 20.5 % — AB (ref 11.5–15.5)
WBC: 12.2 10*3/uL — ABNORMAL HIGH (ref 4.0–10.5)
nRBC: 0 % (ref 0.0–0.2)

## 2018-09-12 LAB — COMPREHENSIVE METABOLIC PANEL
ALK PHOS: 78 U/L (ref 38–126)
ALT: 13 U/L (ref 0–44)
AST: 19 U/L (ref 15–41)
Albumin: 3.5 g/dL (ref 3.5–5.0)
Anion gap: 6 (ref 5–15)
BUN: 20 mg/dL (ref 8–23)
CALCIUM: 8.3 mg/dL — AB (ref 8.9–10.3)
CHLORIDE: 105 mmol/L (ref 98–111)
CO2: 31 mmol/L (ref 22–32)
CREATININE: 1.03 mg/dL — AB (ref 0.44–1.00)
GFR calc Af Amer: >60 mL/min (ref >60)
GFR calc non Af Amer: 53 mL/min — ABNORMAL LOW (ref >60)
Glucose, Bld: 121 mg/dL — ABNORMAL HIGH (ref 70–99)
Potassium: 3.6 mmol/L (ref 3.5–5.1)
SODIUM: 142 mmol/L (ref 135–145)
Total Bilirubin: 0.6 mg/dL (ref 0.3–1.2)
Total Protein: 6.7 g/dL (ref 6.5–8.1)

## 2018-09-12 LAB — HEMOGLOBIN A1C
Hgb A1c MFr Bld: 5.8 % — ABNORMAL HIGH (ref 4.8–5.6)
Mean Plasma Glucose: 119.76 mg/dL

## 2018-09-12 LAB — CG4 I-STAT (LACTIC ACID): Lactic Acid, Venous: 1.36 mmol/L (ref 0.5–1.9)

## 2018-09-12 LAB — TSH: TSH: 40.794 u[IU]/mL — ABNORMAL HIGH (ref 0.350–4.500)

## 2018-09-12 MED ORDER — ACETAMINOPHEN 650 MG RE SUPP: 650.0000 mg | Freq: Four times a day (QID) | RECTAL | Status: DC | PRN

## 2018-09-12 MED ORDER — ACETAMINOPHEN 325 MG PO TABS
650.0000 mg | ORAL_TABLET | Freq: Four times a day (QID) | ORAL | Status: DC | PRN
  Administered 2018-09-13 – 2018-09-14 (×2): 650 mg via ORAL
  Filled 2018-09-12 (×2): qty 2

## 2018-09-12 MED ORDER — AMIODARONE HCL 200 MG PO TABS
200.0000 mg | ORAL_TABLET | Freq: Every day | ORAL | Status: DC
  Administered 2018-09-12 – 2018-09-15 (×4): 200 mg via ORAL
  Filled 2018-09-12 (×4): qty 1

## 2018-09-12 MED ORDER — SODIUM CHLORIDE 0.9 % IV SOLN
2.0000 g | INTRAVENOUS | Status: DC
  Administered 2018-09-12 – 2018-09-14 (×3): 2 g via INTRAVENOUS
  Filled 2018-09-12: qty 20
  Filled 2018-09-12 (×2): qty 2
  Filled 2018-09-12: qty 20

## 2018-09-12 MED ORDER — FUROSEMIDE 10 MG/ML IJ SOLN
40.0000 mg | Freq: Two times a day (BID) | INTRAMUSCULAR | Status: DC
  Administered 2018-09-12 – 2018-09-14 (×3): 40 mg via INTRAVENOUS
  Filled 2018-09-12 (×5): qty 4

## 2018-09-12 MED ORDER — METOPROLOL TARTRATE 25 MG PO TABS
12.5000 mg | ORAL_TABLET | Freq: Two times a day (BID) | ORAL | Status: DC
  Administered 2018-09-12 (×2): 12.5 mg via ORAL
  Filled 2018-09-12 (×2): qty 1

## 2018-09-12 MED ORDER — LISINOPRIL 20 MG PO TABS
20.0000 mg | ORAL_TABLET | Freq: Every day | ORAL | Status: DC
  Administered 2018-09-12 – 2018-09-13 (×2): 20 mg via ORAL
  Filled 2018-09-12 (×2): qty 1

## 2018-09-12 MED ORDER — APIXABAN 5 MG PO TABS
5.0000 mg | ORAL_TABLET | Freq: Two times a day (BID) | ORAL | Status: DC
  Administered 2018-09-12 – 2018-09-15 (×7): 5 mg via ORAL
  Filled 2018-09-12 (×7): qty 1

## 2018-09-12 MED ORDER — LEVOTHYROXINE SODIUM 50 MCG PO TABS
150.0000 ug | ORAL_TABLET | Freq: Every day | ORAL | Status: DC
  Administered 2018-09-13 – 2018-09-15 (×3): 150 ug via ORAL
  Filled 2018-09-12 (×3): qty 1

## 2018-09-12 MED ORDER — SODIUM CHLORIDE 0.9 % IV SOLN
INTRAVENOUS | Status: DC
  Administered 2018-09-12: 07:00:00 via INTRAVENOUS

## 2018-09-12 MED ORDER — SENNOSIDES-DOCUSATE SODIUM 8.6-50 MG PO TABS: 1.0000 | ORAL_TABLET | Freq: Every evening | ORAL | Status: DC | PRN

## 2018-09-12 MED ORDER — BISACODYL 5 MG PO TBEC: 5.0000 mg | DELAYED_RELEASE_TABLET | Freq: Every day | ORAL | Status: DC | PRN

## 2018-09-12 MED ORDER — DOCUSATE SODIUM 100 MG PO CAPS
100.0000 mg | ORAL_CAPSULE | Freq: Two times a day (BID) | ORAL | Status: DC
  Administered 2018-09-12 – 2018-09-15 (×6): 100 mg via ORAL
  Filled 2018-09-12 (×6): qty 1

## 2018-09-12 MED ORDER — FUROSEMIDE 10 MG/ML IJ SOLN
40.0000 mg | Freq: Once | INTRAMUSCULAR | Status: AC
  Administered 2018-09-12: 40 mg via INTRAVENOUS
  Filled 2018-09-12: qty 4

## 2018-09-12 MED ORDER — LABETALOL HCL 5 MG/ML IV SOLN: 5.0000 mg | INTRAVENOUS | Status: DC | PRN

## 2018-09-12 MED ORDER — ONDANSETRON HCL 4 MG PO TABS: 4.0000 mg | ORAL_TABLET | Freq: Four times a day (QID) | ORAL | Status: DC | PRN

## 2018-09-12 MED ORDER — ONDANSETRON HCL 4 MG/2ML IJ SOLN
4.0000 mg | Freq: Four times a day (QID) | INTRAMUSCULAR | Status: DC | PRN
  Administered 2018-09-14: 4 mg via INTRAVENOUS
  Filled 2018-09-12: qty 2

## 2018-09-12 MED ORDER — NITROGLYCERIN 2 % TD OINT
0.5000 [in_us] | TOPICAL_OINTMENT | TRANSDERMAL | Status: AC
  Administered 2018-09-12: 0.5 [in_us] via TOPICAL
  Filled 2018-09-12: qty 1

## 2018-09-12 NOTE — Progress Notes (Signed)
[employer] - [place] [facility]   PATIENT NAME: Danielle Munoz    MR#:  [id number]  DATE OF BIRTH:  May 11, 1941  SUBJECTIVE:  CHIEF COMPLAINT:   Chief Complaint  Patient presents with  . Foot Pain   The patient has b/l leg edema, redness and tenderness.  BP is normal but HR is about 120. On O2 [place] 2 L (home O2) REVIEW OF SYSTEMS:  Review of Systems  Constitutional: Negative for chills, fever and malaise/fatigue.  HENT: Negative for sore throat.   Eyes: Negative for blurred vision and double vision.  Respiratory: Positive for cough and shortness of breath. Negative for hemoptysis, sputum production, wheezing and stridor.   Cardiovascular: Positive for leg swelling. Negative for chest pain, palpitations and orthopnea.  Gastrointestinal: Negative for abdominal pain, blood in stool, diarrhea, melena, nausea and vomiting.  Genitourinary: Negative for dysuria, flank pain and hematuria.  Musculoskeletal: Negative for back pain and joint pain.        b/l leg edema, redness and tenderness.    Skin: Negative for rash.  Neurological: Negative for dizziness, sensory change, focal weakness, seizures, loss of consciousness, weakness and headaches.  Endo/Heme/Allergies: Negative for polydipsia.  Psychiatric/Behavioral: Negative for depression. The patient is not nervous/anxious.     DRUG ALLERGIES:  No Known Allergies VITALS:  Blood pressure 129/78, pulse (!) 120, temperature 98.7 F (37.1 C), temperature source Oral, resp. rate 18, height 5' (1.524 m), weight 113.4 kg, SpO2 98 %. PHYSICAL EXAMINATION:  Physical Exam Constitutional:      General: She is not in acute distress.    Comments: Morbid obesity  HENT:     Head: Normocephalic.  Eyes:     General: No scleral icterus.    Conjunctiva/sclera: Conjunctivae normal.     Pupils: Pupils are equal, round, and reactive to light.  Neck:     Musculoskeletal: Normal range of motion and neck supple.     Vascular: No  JVD.     Trachea: No tracheal deviation.  Cardiovascular:     Rate and Rhythm: Tachycardia present. Rhythm irregular.     Heart sounds: Normal heart sounds. No murmur. No gallop.   Pulmonary:     Effort: Pulmonary effort is normal. No respiratory distress.     Breath sounds: Normal breath sounds. No wheezing or rales.  Abdominal:     General: Bowel sounds are normal. There is no distension.     Palpations: Abdomen is soft.     Tenderness: There is no abdominal tenderness. There is no rebound.  Musculoskeletal: Normal range of motion.        General: Swelling and tenderness present.     Right lower leg: Edema present.     Left lower leg: Edema present.  Skin:    Findings: Erythema present. No rash.  Neurological:     General: No focal deficit present.     Mental Status: She is alert and oriented to person, place, and time.     Cranial Nerves: No cranial nerve deficit.  Psychiatric:        Mood and Affect: Mood normal.    LABORATORY PANEL:  Female CBC Recent Labs  Lab 09/12/18 0247  WBC 12.2*  HGB 11.1*  HCT 39.7  PLT 353   ------------------------------------------------------------------------------------------------------------------ Chemistries  Recent Labs  Lab 09/12/18 0247  NA 142  K 3.6  CL 105  CO2 31  GLUCOSE 121*  BUN 20  CREATININE 1.03*  CALCIUM 8.3*  AST 19  ALT  13  ALKPHOS 78  BILITOT 0.6   RADIOLOGY:  Dg Chest 2 View  Result Date: 09/12/2018 CLINICAL DATA:  77 year old female with shortness of breath. EXAM: CHEST - 2 VIEW COMPARISON:  Chest radiograph dated 11/08/2017 FINDINGS: There is cardiomegaly with vascular congestion. Diffuse interstitial coarsening and bronchitic changes. Left lung base density may represent combination of a small pleural effusion or atelectasis although infiltrate is not excluded. Clinical correlation is recommended. No pneumothorax. Atherosclerotic calcification of the aortic arch. Osteopenia with degenerative changes  of the spine. No acute osseous pathology. IMPRESSION: 1. Cardiomegaly with vascular congestion . 2. Left lung base atelectasis versus infiltrate. Possible small left pleural effusion. Electronically Signed   By: Elgie CollardArash  Radparvar M.D.   On: 09/12/2018 03:57   ASSESSMENT AND PLAN:   This is a 77 year old female admitted for cellulitis.  1.  Sepsis due to Cellulitis: Bilateral lower extremities; continue ceftriaxone.   2.  Afib with RVR; continue Eliquis and amiodarone, start lopressor. 3.   Acute on chronic combined CHF  Hold torsemide, start lasix 40 mg bid iv. 4.  Hypertension: controlled, continue home meds. Labetalol as needed. 5. Chronic respiratory failure and COPD. Stable.  All the records are reviewed and case discussed with Care Management/Social Worker. Management plans discussed with the patient, sister and they are in agreement.  CODE STATUS: Full Code  TOTAL TIME TAKING CARE OF THIS PATIENT: 27 minutes.   More than 50% of the time was spent in counseling/coordination of care: YES  POSSIBLE D/C IN 2-3 DAYS, DEPENDING ON CLINICAL CONDITION.   Shaune PollackQing Wilhemina Grall M.D on 09/12/2018 at 6:01 PM  Between 7am to 6pm - Pager - [phone]  After 6pm go to [email or web address] - Therapist, nutritionalpassword [email or web address] [email or web address]  [facility]

## 2018-09-12 NOTE — Progress Notes (Addendum)
Dr. Imogene Burnhen notified of pts HR being,120's,  No new orders received.

## 2018-09-12 NOTE — ED Notes (Signed)
Patient transported to X-ray 

## 2018-09-12 NOTE — Plan of Care (Signed)
Pt refused to watch Emi videos, will attempt at a later time. Problem: Education: Goal: Knowledge of General Education information will improve Description Including pain rating scale, medication(s)/side effects and non-pharmacologic comfort measures Outcome: Progressing   Problem: Health Behavior/Discharge Planning: Goal: Ability to manage health-related needs will improve Outcome: Progressing   Pt has been ambulating in room with no complains Problem: Activity: Goal: Risk for activity intolerance will decrease Outcome: Progressing

## 2018-09-12 NOTE — ED Notes (Signed)
Pt reports that she "always" had swelling in her feet but they seem to be worse and more painful. It has been several months since she has seen her doctor. Shortness of breath has been somewhat worse than normal. When changing patient out in to gown it was noted that patient has redness and warmth to the lower abdominal area.

## 2018-09-12 NOTE — Progress Notes (Signed)
[facility] Care Plan.  Purpose of Encounter: CODE STATUS. Parties in Attendance: The patient and me. Patient's Decisional Capacity: Yes. Medical Story: The patient with past medical history of chronic respiratory failure, atrial flutter, CHF, COPD, hypertension and CKD is admitted for acute on chronic CHF, bilateral leg cellulitis and sepsis.  I discussed with the patient about her current condition, prognosis and CODE STATUS.  The patient wants to be resuscitated and intubated if she has cardiopulmonary arrest.  She said she had cardiac arrest before and survived after CPR and intubation.  Plan:  Code Status: Full code. Time spent discussing advance care planning: 17 minutes.

## 2018-09-12 NOTE — ED Provider Notes (Signed)
[facility] Emergency Department Provider Note ____   First MD Initiated Contact with Patient 09/12/18 [phone]     (approximate)  I have reviewed the triage vital signs and the nursing notes.   HISTORY  Chief Complaint Foot Pain    HPI Danielle Munoz is a 77 y.o. female with below list of chronic medical conditions presents to the emergency department via EMS from home with bilateral leg pain that is currently 7 out of 10.  Patient also admits to bilateral lower extremity swelling.  Patient states that she has a history of CHF and does admit to medication noncompliance to EMS staff.  Patient's oxygen saturation 88% for EMS.   Past Medical History:  Diagnosis Date  . Atrial flutter (HCC)    a. s/p TEE/DCCV 10/18 @ [facility] complicated by asystole and junctional rhythm requiring epi, atropine, and CPR x < 1 minute; b. amio and Eliquis; c. CHADS2VASC => 6 (CHF, HTN, age x 2, vascular disease, female)  . Chronic combined systolic and diastolic CHF (congestive heart failure) (HCC)   . CKD (chronic kidney disease), stage II   . COPD (chronic obstructive pulmonary disease) (HCC)   . Hypertension   . Hypothyroidism   . Iron deficiency anemia   . Morbid obesity (HCC)   . Noncompliance     Patient Active Problem List   Diagnosis Date Noted  . Sepsis (HCC) 09/12/2018  . Acute on chronic diastolic heart failure (HCC)   . Acute on chronic systolic CHF (congestive heart failure) (HCC) 11/07/2017  . Hypothyroidism 11/07/2017  . HTN (hypertension) 11/07/2017  . COPD (chronic obstructive pulmonary disease) (HCC) 11/07/2017    Past Surgical History:  Procedure Laterality Date  . HERNIA REPAIR      Prior to Admission medications   Medication Sig Start Date End Date Taking? Authorizing Provider  levothyroxine (SYNTHROID, LEVOTHROID) 100 MCG tablet Take 100 mcg by mouth daily before breakfast.   Yes [provider]  torsemide (DEMADEX) 20 MG tablet Take 40  mg by mouth daily.   Yes [provider]  amiodarone (PACERONE) 200 MG tablet Take 1 tablet (200 mg total) by mouth 2 (two) times daily. Take 1 tablet twice a day for 1 week and then 1 tablet once a day Patient taking differently: Take 200 mg by mouth daily.  11/11/17   Enid Baas, MD  apixaban (ELIQUIS) 5 MG TABS tablet Take 5 mg by mouth 2 (two) times daily. 07/16/17   [provider]  chlorpheniramine-HYDROcodone (TUSSIONEX PENNKINETIC ER) 10-8 MG/5ML SUER Take 5 mLs by mouth every 12 (twelve) hours as needed for cough. Patient not taking: Reported on 09/12/2018 11/11/17   Enid Baas, MD  furosemide (LASIX) 40 MG tablet Take 1 tablet (40 mg total) by mouth daily. 11/11/17 02/09/18  Enid Baas, MD  levothyroxine (SYNTHROID, LEVOTHROID) 100 MCG tablet Take 100 mcg by mouth daily. 07/18/17 07/18/18  [provider]  lisinopril (PRINIVIL,ZESTRIL) 20 MG tablet Take 1 tablet (20 mg total) by mouth daily. 11/12/17   Enid Baas, MD  predniSONE (STERAPRED UNI-PAK 21 TAB) 10 MG (21) TBPK tablet 6 tabs PO x 1 day 5 tabs PO x 1 day 4 tabs PO x 1 day 3 tabs PO x 1 day 2 tabs PO x 1 day 1 tab PO x 1 day and stop Patient not taking: Reported on 09/12/2018 11/11/17   Enid Baas, MD    Allergies Patient has no known allergies.  Family History  Problem Relation  Age of Onset  . Diabetes Brother   . CAD Mother   . CAD Father   . Breast cancer Sister     Social History Social History   Tobacco Use  . Smoking status: Former Smoker    Types: Cigarettes  . Smokeless tobacco: Never Used  Substance Use Topics  . Alcohol use: No    Frequency: Never  . Drug use: No    Review of Systems Constitutional: No fever/chills Eyes: No visual changes. ENT: No sore throat. Cardiovascular: Denies chest pain. Respiratory: Denies shortness of breath. Gastrointestinal: No abdominal pain.  No nausea, no vomiting.  No diarrhea.  No  constipation. Genitourinary: Negative for dysuria. Musculoskeletal: Negative for neck pain.  Negative for back pain. Integumentary: Positive for bilateral lower extremity redness swelling. Neurological: Negative for headaches, focal weakness or numbness.  ____________________________________________   PHYSICAL EXAM:  VITAL SIGNS: ED Triage Vitals  Enc Vitals Group     BP 09/12/18 0242 (!) 186/102     Pulse Rate 09/12/18 0242 (!) 118     Resp 09/12/18 0242 (!) 26     Temp 09/12/18 0242 98 F (36.7 C)     Temp Source 09/12/18 0242 Oral     SpO2 09/12/18 0242 100 %     Weight 09/12/18 0240 113.4 kg (250 lb)     Height 09/12/18 0240 1.524 m (5')     Head Circumference --      Peak Flow --      Pain Score 09/12/18 0240 0     Pain Loc --      Pain Edu? --      Excl. in GC? --     Constitutional: Alert and oriented. Well appearing and in no acute distress. Eyes: Conjunctivae are normal.  Mouth/Throat: Mucous membranes are moist.  Oropharynx non-erythematous. Neck: No stridor.  Cardiovascular: Normal rate, regular rhythm. Good peripheral circulation. Grossly normal heart sounds. Respiratory: Normal respiratory effort.  No retractions.  Bibasilar rhonchi Gastrointestinal: Soft and nontender. No distention.  Abdominal wall blanching erythema hot to touch insistent with cellulitis Musculoskeletal: Bilateral lower extremity 2+ pitting edema with blanching erythema warmth to touch consistent with cellulitis. Neurologic:  Normal speech and language. No gross focal neurologic deficits are appreciated.  Skin: Bilateral lower extremity and abdominal wall blanching erythema hot to touch consistent with cellulitis. Psychiatric: Mood and affect are normal. Speech and behavior are normal.  ____________________________________________   LABS (all labs ordered are listed, but only abnormal results are displayed)  Labs Reviewed  COMPREHENSIVE METABOLIC PANEL - Abnormal; Notable for the  following components:      Result Value   Glucose, Bld 121 (*)    Creatinine, Ser 1.03 (*)    Calcium 8.3 (*)    GFR calc non Af Amer 53 (*)    All other components within normal limits  CBC WITH DIFFERENTIAL/PLATELET - Abnormal; Notable for the following components:   WBC 12.2 (*)    RBC 5.32 (*)    Hemoglobin 11.1 (*)    MCV 74.6 (*)    MCH 20.9 (*)    MCHC 28.0 (*)    RDW 20.5 (*)    Neutro Abs 9.6 (*)    Monocytes Absolute 1.1 (*)    Abs Immature Granulocytes 0.08 (*)    All other components within normal limits  CULTURE, BLOOD (ROUTINE X 2)  CULTURE, BLOOD (ROUTINE X 2)  URINALYSIS, COMPLETE (UACMP) WITH MICROSCOPIC  URINALYSIS, ROUTINE W REFLEX MICROSCOPIC  CG4 I-STAT (LACTIC  ACID)  I-STAT CG4 LACTIC ACID, ED  I-STAT CG4 LACTIC ACID, ED  I-STAT CG4 LACTIC ACID, ED   ____________________________________________  EKG  ED ECG REPORT I, [place] Ewart, the attending physician, personally viewed and interpreted this ECG.   Date: 09/12/2018  EKG Time: 2:45 AM  Rate: 118  Rhythm: Sinus tachycardia  Axis: Normal  Intervals: Normal  ST&T Change: None  ____________________________________________  RADIOLOGY I, [place] N Jonell Brumbaugh, personally viewed and evaluated these images (plain radiographs) as part of my medical decision making, as well as reviewing the written report by the radiologist.  ED MD interpretation: Cardiomegaly with vascular congestion left lung base atelectasis versus infiltrate small left pleural effusion per radiologist on chest x-ray results.  Official radiology report(s): Dg Chest 2 View  Result Date: 09/12/2018 CLINICAL DATA:  77 year old female with shortness of breath. EXAM: CHEST - 2 VIEW COMPARISON:  Chest radiograph dated 11/08/2017 FINDINGS: There is cardiomegaly with vascular congestion. Diffuse interstitial coarsening and bronchitic changes. Left lung base density may represent combination of a small pleural effusion or atelectasis  although infiltrate is not excluded. Clinical correlation is recommended. No pneumothorax. Atherosclerotic calcification of the aortic arch. Osteopenia with degenerative changes of the spine. No acute osseous pathology. IMPRESSION: 1. Cardiomegaly with vascular congestion . 2. Left lung base atelectasis versus infiltrate. Possible small left pleural effusion. Electronically Signed   By: Elgie Collard M.D.   On: 09/12/2018 03:57    ____________________________________________   PROCEDURES  Critical Care performed:  .Critical Care Performed by: Darci Current, MD Authorized by: Darci Current, MD   Critical care provider statement:    Critical care time (minutes):  45   Critical care time was exclusive of:  Separately billable procedures and treating other patients   Critical care was necessary to treat or prevent imminent or life-threatening deterioration of the following conditions:  Sepsis and cardiac failure   Critical care was time spent personally by me on the following activities:  Development of treatment plan with patient or surrogate, discussions with consultants, evaluation of patient's response to treatment, examination of patient, obtaining history from patient or surrogate, ordering and performing treatments and interventions, ordering and review of laboratory studies, ordering and review of radiographic studies, pulse oximetry, re-evaluation of patient's condition and review of old charts   I assumed direction of critical care for this patient from another provider in my specialty: no       ____________________________________________   INITIAL IMPRESSION / ASSESSMENT AND PLAN / ED COURSE  As part of my medical decision making, I reviewed the following data within the electronic MEDICAL RECORD NUMBER 77 year old female presented with above-stated history and physical exam concerning for sepsis secondary to cellulitis/panniculitis.  In addition also concern for CHF  exacerbation.  Chest x-ray revealed evidence of pleural effusion and cardiomegaly with vascular congestion as such patient given Lasix 40 mg IV.  Regarding the patient's infectious etiology patient given appropriate antibiotic therapy sepsis protocol initiated.  Patient discussed with Dr. Sheryle Hail for hospital admission further evaluation and management. ____________________________________________  FINAL CLINICAL IMPRESSION(S) / ED DIAGNOSES  Final diagnoses:  Sepsis, due to unspecified organism, unspecified whether acute organ dysfunction present (HCC)  Panniculitis  Cellulitis of abdominal wall  Cellulitis of lower extremity, unspecified laterality     MEDICATIONS GIVEN DURING THIS VISIT:  Medications  cefTRIAXone (ROCEPHIN) 2 g in sodium chloride 0.9 % 100 mL IVPB (has no administration in time range)  nitroGLYCERIN (NITROGLYN) 2 % ointment 0.5 inch (has no  administration in time range)  labetalol (NORMODYNE,TRANDATE) injection 5-10 mg (has no administration in time range)     ED Discharge Orders    None       Note:  This document was prepared using Dragon voice recognition software and may include unintentional dictation errors.    Darci Current, MD 09/12/18 [phone]

## 2018-09-12 NOTE — H&P (Signed)
Danielle Munoz is an 77 y.o. female.   Chief Complaint: Foot pain HPI: The patient with past medical history of atrial flutter, CHF, COPD, hypertension and CKD presents to the emergency department complaining of foot pain.  The patient states that her feet hurt so badly that it is difficult to walk.  She describes the pain as sharp.  It is not burning pain.  She denied shortness of breath to me but she had an acute episode of shortness of breath in the emergency department that required placement of 5 L of oxygen via nasal cannula.  By the time of my exam the patient was on 2 L of oxygen via nasal cannula with excellent oxygen saturations.  Physical exam was significant for redness and swelling of both legs below the knee.  The patient also has untrimmed toenails that are dirty and possibly infected.  She was started on antibiotics prior to the emergency department staff calling the hospitalist service for admission.  Past Medical History:  Diagnosis Date  . Atrial flutter (HCC)    a. s/p TEE/DCCV 10/18 @ [facility] complicated by asystole and junctional rhythm requiring epi, atropine, and CPR x < 1 minute; b. amio and Eliquis; c. CHADS2VASC => 6 (CHF, HTN, age x 2, vascular disease, female)  . Chronic combined systolic and diastolic CHF (congestive heart failure) (HCC)   . CKD (chronic kidney disease), stage II   . COPD (chronic obstructive pulmonary disease) (HCC)   . Hypertension   . Hypothyroidism   . Iron deficiency anemia   . Morbid obesity (HCC)   . Noncompliance     Past Surgical History:  Procedure Laterality Date  . HERNIA REPAIR      Family History  Problem Relation Age of Onset  . Diabetes Brother   . CAD Mother   . CAD Father   . Breast cancer Sister    Social History:  reports that she has quit smoking. Her smoking use included cigarettes. She has never used smokeless tobacco. She reports that she does not drink alcohol or use drugs.  Allergies: No Known  Allergies  Medications Prior to Admission  Medication Sig Dispense Refill  . levothyroxine (SYNTHROID, LEVOTHROID) 100 MCG tablet Take 100 mcg by mouth daily before breakfast.    . torsemide (DEMADEX) 20 MG tablet Take 40 mg by mouth daily.    Marland Kitchen amiodarone (PACERONE) 200 MG tablet Take 1 tablet (200 mg total) by mouth 2 (two) times daily. Take 1 tablet twice a day for 1 week and then 1 tablet once a day (Patient taking differently: Take 200 mg by mouth daily. ) 37 tablet 2  . apixaban (ELIQUIS) 5 MG TABS tablet Take 5 mg by mouth 2 (two) times daily.    . chlorpheniramine-HYDROcodone (TUSSIONEX PENNKINETIC ER) 10-8 MG/5ML SUER Take 5 mLs by mouth every 12 (twelve) hours as needed for cough. (Patient not taking: Reported on 09/12/2018) 115 mL 0  . furosemide (LASIX) 40 MG tablet Take 1 tablet (40 mg total) by mouth daily. 30 tablet 2  . levothyroxine (SYNTHROID, LEVOTHROID) 100 MCG tablet Take 100 mcg by mouth daily.    Marland Kitchen lisinopril (PRINIVIL,ZESTRIL) 20 MG tablet Take 1 tablet (20 mg total) by mouth daily. 30 tablet 2  . predniSONE (STERAPRED UNI-PAK 21 TAB) 10 MG (21) TBPK tablet 6 tabs PO x 1 day 5 tabs PO x 1 day 4 tabs PO x 1 day 3 tabs PO x 1 day 2 tabs PO x 1 day 1  tab PO x 1 day and stop (Patient not taking: Reported on 09/12/2018) 21 tablet 0    Results for orders placed or performed during the hospital encounter of 09/12/18 (from the past 48 hour(s))  Comprehensive metabolic panel     Status: Abnormal   Collection Time: 09/12/18  2:47 AM  Result Value Ref Range   Sodium 142 135 - 145 mmol/L   Potassium 3.6 3.5 - 5.1 mmol/L   Chloride 105 98 - 111 mmol/L   CO2 31 22 - 32 mmol/L   Glucose, Bld 121 (H) 70 - 99 mg/dL   BUN 20 8 - 23 mg/dL   Creatinine, Ser 1.61 (H) 0.44 - 1.00 mg/dL   Calcium 8.3 (L) 8.9 - 10.3 mg/dL   Total Protein 6.7 6.5 - 8.1 g/dL   Albumin 3.5 3.5 - 5.0 g/dL   AST 19 15 - 41 U/L   ALT 13 0 - 44 U/L   Alkaline Phosphatase 78 38 - 126 U/L   Total  Bilirubin 0.6 0.3 - 1.2 mg/dL   GFR calc non Af Amer 53 (L) >60 mL/min   GFR calc Af Amer >60 >60 mL/min   Anion gap 6 5 - 15    Comment: Performed at [facility], [address] [place], [place] [address]  CBC with Differential     Status: Abnormal   Collection Time: 09/12/18  2:47 AM  Result Value Ref Range   WBC 12.2 (H) 4.0 - 10.5 K/uL   RBC 5.32 (H) 3.87 - 5.11 MIL/uL   Hemoglobin 11.1 (L) 12.0 - 15.0 g/dL   HCT 54.0 98.1 - 19.1 %   MCV 74.6 (L) 80.0 - 100.0 fL   MCH 20.9 (L) 26.0 - 34.0 pg   MCHC 28.0 (L) 30.0 - 36.0 g/dL   RDW 47.8 (H) 29.5 - 62.1 %   Platelets 353 150 - 400 K/uL   nRBC 0.0 0.0 - 0.2 %   Neutrophils Relative % 78 %   Neutro Abs 9.6 (H) 1.7 - 7.7 K/uL   Lymphocytes Relative 8 %   Lymphs Abs 1.0 0.7 - 4.0 K/uL   Monocytes Relative 9 %   Monocytes Absolute 1.1 (H) 0.1 - 1.0 K/uL   Eosinophils Relative 3 %   Eosinophils Absolute 0.4 0.0 - 0.5 K/uL   Basophils Relative 1 %   Basophils Absolute 0.1 0.0 - 0.1 K/uL   Immature Granulocytes 1 %   Abs Immature Granulocytes 0.08 (H) 0.00 - 0.07 K/uL    Comment: Performed at [facility], [address] [place], [place] [address]  CG4 I-STAT (Lactic acid)     Status: None   Collection Time: 09/12/18  2:51 AM  Result Value Ref Range   Lactic Acid, Venous 1.36 0.5 - 1.9 mmol/L  Blood Culture (routine x 2)     Status: None (Preliminary result)   Collection Time: 09/12/18  3:27 AM  Result Value Ref Range   Specimen Description BLOOD LEFT UPPER ARM    Special Requests      BOTTLES DRAWN AEROBIC AND ANAEROBIC Blood Culture adequate volume   Culture      NO GROWTH <12 HOURS Performed at [facility], [address] [place], [place] [address]    Report Status PENDING   Blood Culture (routine x 2)     Status: None (Preliminary result)   Collection Time: 09/12/18  3:28 AM  Result Value Ref Range   Specimen Description BLOOD BLOOD LEFT HAND  Special Requests      BOTTLES DRAWN  AEROBIC AND ANAEROBIC Blood Culture adequate volume   Culture      NO GROWTH <12 HOURS Performed at [facility], [address] [place], [place] [address]    Report Status PENDING   TSH     Status: Abnormal   Collection Time: 09/12/18  5:47 AM  Result Value Ref Range   TSH 40.794 (H) 0.350 - 4.500 uIU/mL    Comment: Performed by a 3rd Generation assay with a functional sensitivity of <=0.01 uIU/mL. Performed at [facility], [address], [place], [place] [address]    Dg Chest 2 View  Result Date: 09/12/2018 CLINICAL DATA:  77 year old female with shortness of breath. EXAM: CHEST - 2 VIEW COMPARISON:  Chest radiograph dated 11/08/2017 FINDINGS: There is cardiomegaly with vascular congestion. Diffuse interstitial coarsening and bronchitic changes. Left lung base density may represent combination of a small pleural effusion or atelectasis although infiltrate is not excluded. Clinical correlation is recommended. No pneumothorax. Atherosclerotic calcification of the aortic arch. Osteopenia with degenerative changes of the spine. No acute osseous pathology. IMPRESSION: 1. Cardiomegaly with vascular congestion . 2. Left lung base atelectasis versus infiltrate. Possible small left pleural effusion. Electronically Signed   By: Elgie Collard M.D.   On: 09/12/2018 03:57    Review of Systems  Constitutional: Negative for chills and fever.  HENT: Negative for sore throat and tinnitus.   Eyes: Negative for blurred vision and redness.  Respiratory: Negative for cough and shortness of breath.   Cardiovascular: Negative for chest pain, palpitations, orthopnea and PND.  Gastrointestinal: Negative for abdominal pain, diarrhea, nausea and vomiting.  Genitourinary: Negative for dysuria, frequency and urgency.  Musculoskeletal: Negative for joint pain and myalgias.  Skin: Negative for rash.       No lesions  Neurological: Negative for speech change, focal weakness and weakness.   Endo/Heme/Allergies: Does not bruise/bleed easily.       No temperature intolerance  Psychiatric/Behavioral: Negative for depression and suicidal ideas.    Blood pressure (!) 144/88, pulse (!) 120, temperature 98 F (36.7 C), temperature source Oral, resp. rate 18, height 5' (1.524 m), weight 113.4 kg, SpO2 92 %. Physical Exam  Vitals reviewed. Constitutional: She is oriented to person, place, and time. She appears well-developed and well-nourished.  HENT:  Head: Normocephalic and atraumatic.  Mouth/Throat: Oropharynx is clear and moist.  Eyes: Pupils are equal, round, and reactive to light. Conjunctivae and EOM are normal. No scleral icterus.  Neck: Normal range of motion. Neck supple. No JVD present. No tracheal deviation present. No thyromegaly present.  Cardiovascular: Normal rate, regular rhythm and normal heart sounds. Exam reveals no gallop and no friction rub.  No murmur heard. Respiratory: Effort normal and breath sounds normal.  GI: Soft. Bowel sounds are normal. She exhibits no distension. There is no abdominal tenderness.  Genitourinary:    Genitourinary Comments: Deferred   Musculoskeletal: Normal range of motion.        General: Tenderness and edema present.  Lymphadenopathy:    She has no cervical adenopathy.  Neurological: She is alert and oriented to person, place, and time. No cranial nerve deficit. She exhibits normal muscle tone.  Skin: Skin is warm and dry. There is erythema.  Psychiatric: She has a normal mood and affect. Her behavior is normal. Judgment and thought content normal.     Assessment/Plan This is a 77 year old female admitted for cellulitis. 1.  Cellulitis: Bilateral lower extremities; continue  ceftriaxone.  Consider adding vancomycin.  This appears to be secondary to venous stasis although the patient also has an avulsed toenail on her first left toe that could be a source of infection. 2.  Sepsis: The patient meets criteria via tachycardia,  tachypnea and leukocytosis. 3.  CHF: Acute on chronic combined heart failure.  The patient was given a dose of Lasix in the emergency department.  She also had some episodes of hypoxia that were resolved by the time of my examination which was prior to Lasix.  Continue to evaluate for further diuresis. 4.  Hypertension: Acceptable for age.  Labetalol as needed.  Resume home medications once reconciled with pharmacy.  Also, there is strong suspicion for noncompliance as medication reconciliation is difficult due to significant labs and filled meds. 5.  DVT prophylaxis: SCDs/TED hose 6.  GI prophylaxis: None The patient is a full code.  Time spent on admission orders and patient care approximately 45 minutes   Arnaldo Nataliamond,  Sameena Artus S, MD 09/12/2018, 8:01 AM

## 2018-09-12 NOTE — Plan of Care (Signed)
  Problem: Fluid Volume: Goal: Hemodynamic stability will improve Outcome: Progressing   Problem: Respiratory: Goal: Ability to maintain adequate ventilation will improve Outcome: Progressing   

## 2018-09-12 NOTE — Care Management Note (Signed)
Case Management Note  Patient Details  Name: Danielle Munoz MRN: [id number] Date of Birth: 07/20/1941  Subjective/Objective:   From home; grandson lives with patient.  Admitted with sepsis, COPD exacerbation.  On 3L chronic O2.  Patient has a scale at home.  Weighs every other day.  Current with Dr. Teofilo PodSullivan [facility].  Has not been taking medications as she should.  She states she will start to do better.  She states she has prescription coverage.  Would be open to having home health RN, PT, Aide and SW.  Heart failure protocol on chart; needs MD signature.  She uses a walker at home.  HAs a left foot wound.  Dressing changes everyday.  She states her grandson changes her dressing for her daily.   Will continue to follow through discharge and assist with discharge planning.                Action/Plan:   Expected Discharge Date:                  Expected Discharge Plan:  Home w Home Health Services  In-House Referral:     Discharge planning Services  CM Consult  Post Acute Care Choice:  Home Health Choice offered to:  Patient  DME Arranged:    DME Agency:     HH Arranged:  RN, PT, Nurse's Aide, Social Work [employer] Agency[facility]  [facility]  Status of Service:  In process, will continue to follow  If discussed at Long Length of Stay Meetings, dates discussed:    Additional Comments:  Sherren KernsJennifer L Noor Witte, RN 09/12/2018, 5:22 PM

## 2018-09-12 NOTE — ED Triage Notes (Signed)
Pt arrives via EMS from home with c/o feet pain. Per their report, pt has CHF, non compliant with her medications. On chronic home O2 at 3Liters. Saturations in the 80's, O2 increased to 5Linters en route. Hr has been in the 120's.

## 2018-09-12 NOTE — Progress Notes (Signed)
CODE SEPSIS - PHARMACY COMMUNICATION  **Broad Spectrum Antibiotics should be administered within 1 hour of Sepsis diagnosis**  Time Code Sepsis Called/Page Received: 12/13 0316  Antibiotics Ordered: 12/13 0317  Time of 1st antibiotic administration: 12/13 0539  Additional action taken by pharmacy:   If necessary, Name of Provider/Nurse Contacted:     Erich MontaneMcBane,Clydine Parkison S ,PharmD Clinical Pharmacist  09/12/2018  6:01 AM

## 2018-09-13 DIAGNOSIS — I872 Venous insufficiency (chronic) (peripheral): Secondary | ICD-10-CM

## 2018-09-13 DIAGNOSIS — R600 Localized edema: Secondary | ICD-10-CM

## 2018-09-13 DIAGNOSIS — L97219 Non-pressure chronic ulcer of right calf with unspecified severity: Secondary | ICD-10-CM

## 2018-09-13 DIAGNOSIS — I10 Essential (primary) hypertension: Secondary | ICD-10-CM

## 2018-09-13 DIAGNOSIS — L97229 Non-pressure chronic ulcer of left calf with unspecified severity: Secondary | ICD-10-CM

## 2018-09-13 DIAGNOSIS — I509 Heart failure, unspecified: Secondary | ICD-10-CM

## 2018-09-13 DIAGNOSIS — I519 Heart disease, unspecified: Secondary | ICD-10-CM

## 2018-09-13 DIAGNOSIS — I499 Cardiac arrhythmia, unspecified: Secondary | ICD-10-CM

## 2018-09-13 LAB — MAGNESIUM: Magnesium: 2.2 mg/dL (ref 1.7–2.4)

## 2018-09-13 LAB — BASIC METABOLIC PANEL
Anion gap: 7 (ref 5–15)
BUN: 20 mg/dL (ref 8–23)
CALCIUM: 8 mg/dL — AB (ref 8.9–10.3)
CO2: 34 mmol/L — AB (ref 22–32)
Chloride: 103 mmol/L (ref 98–111)
Creatinine, Ser: 1.05 mg/dL — ABNORMAL HIGH (ref 0.44–1.00)
GFR calc Af Amer: 60 mL/min — ABNORMAL LOW (ref >60)
GFR calc non Af Amer: 52 mL/min — ABNORMAL LOW (ref >60)
Glucose, Bld: 93 mg/dL (ref 70–99)
Potassium: 3.5 mmol/L (ref 3.5–5.1)
Sodium: 144 mmol/L (ref 135–145)

## 2018-09-13 MED ORDER — METOPROLOL TARTRATE 50 MG PO TABS
50.0000 mg | ORAL_TABLET | Freq: Two times a day (BID) | ORAL | Status: DC
  Administered 2018-09-14 – 2018-09-15 (×2): 50 mg via ORAL
  Filled 2018-09-13 (×4): qty 1

## 2018-09-13 MED ORDER — METOPROLOL TARTRATE 25 MG PO TABS
25.0000 mg | ORAL_TABLET | Freq: Two times a day (BID) | ORAL | Status: DC
  Administered 2018-09-13: 25 mg via ORAL
  Filled 2018-09-13: qty 1

## 2018-09-13 MED ORDER — SODIUM CHLORIDE 0.9 % IV SOLN
INTRAVENOUS | Status: DC | PRN
  Administered 2018-09-13: 1000 mL via INTRAVENOUS
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
## 2018-09-13 NOTE — Progress Notes (Signed)
[facility] [place] [facility]   PATIENT NAME: Danielle Munoz    MR#:  [id number]  DATE OF BIRTH:  04-23-1941  SUBJECTIVE:  CHIEF COMPLAINT:   Chief Complaint  Patient presents with  . Foot Pain   The patient has cough and shortness of breath.  HR is about 110's. On O2 [place] 2 L (home O2) REVIEW OF SYSTEMS:  Review of Systems  Constitutional: Negative for chills, fever and malaise/fatigue.  HENT: Negative for sore throat.   Eyes: Negative for blurred vision and double vision.  Respiratory: Positive for cough and shortness of breath. Negative for hemoptysis, sputum production, wheezing and stridor.   Cardiovascular: Positive for leg swelling. Negative for chest pain, palpitations and orthopnea.  Gastrointestinal: Negative for abdominal pain, blood in stool, diarrhea, melena, nausea and vomiting.  Genitourinary: Negative for dysuria, flank pain and hematuria.  Musculoskeletal: Negative for back pain and joint pain.        b/l leg edema, redness and tenderness.    Skin: Negative for rash.  Neurological: Negative for dizziness, sensory change, focal weakness, seizures, loss of consciousness, weakness and headaches.  Endo/Heme/Allergies: Negative for polydipsia.  Psychiatric/Behavioral: Negative for depression. The patient is not nervous/anxious.     DRUG ALLERGIES:  No Known Allergies VITALS:  Blood pressure 131/72, pulse (!) 113, temperature 97.9 F (36.6 C), temperature source Oral, resp. rate 20, height 5' (1.524 m), weight 119.4 kg, SpO2 99 %. PHYSICAL EXAMINATION:  Physical Exam Constitutional:      General: She is not in acute distress.    Comments: Morbid obesity  HENT:     Head: Normocephalic.  Eyes:     General: No scleral icterus.    Conjunctiva/sclera: Conjunctivae normal.     Pupils: Pupils are equal, round, and reactive to light.  Neck:     Musculoskeletal: Normal range of motion and neck supple.     Vascular: No JVD.     Trachea: No  tracheal deviation.  Cardiovascular:     Rate and Rhythm: Tachycardia present. Rhythm irregular.     Heart sounds: Normal heart sounds. No murmur. No gallop.   Pulmonary:     Effort: Pulmonary effort is normal. No respiratory distress.     Breath sounds: Normal breath sounds. No wheezing or rales.  Abdominal:     General: Bowel sounds are normal. There is no distension.     Palpations: Abdomen is soft.     Tenderness: There is no abdominal tenderness. There is no rebound.  Musculoskeletal: Normal range of motion.        General: Swelling and tenderness present.     Right lower leg: Edema present.     Left lower leg: Edema present.  Skin:    Findings: Erythema present. No rash.  Neurological:     General: No focal deficit present.     Mental Status: She is alert and oriented to person, place, and time.     Cranial Nerves: No cranial nerve deficit.  Psychiatric:        Mood and Affect: Mood normal.    LABORATORY PANEL:  Female CBC Recent Labs  Lab 09/12/18 0247  WBC 12.2*  HGB 11.1*  HCT 39.7  PLT 353   ------------------------------------------------------------------------------------------------------------------ Chemistries  Recent Labs  Lab 09/12/18 0247 09/13/18 0615  NA 142 144  K 3.6 3.5  CL 105 103  CO2 31 34*  GLUCOSE 121* 93  BUN 20 20  CREATININE 1.03* 1.05*  CALCIUM 8.3* 8.0*  MG  --  2.2  AST 19  --   ALT 13  --   ALKPHOS 78  --   BILITOT 0.6  --    RADIOLOGY:  No results found. ASSESSMENT AND PLAN:   This is a [age] year old female admitted for cellulitis.  1.  Sepsis due to Cellulitis: Bilateral lower extremities; continue ceftriaxone.    2.  Afib with RVR; continue Eliquis and amiodarone, added lopressor.  3.   Acute on chronic combined CHF  Hold torsemide, continue lasix 40 mg bid iv.  4.  Hypertension: controlled, continue home meds. Labetalol as needed. 5. Chronic respiratory failure and COPD. Stable.  Marked lower extremity  swelling and stasis dermatitis. Per Dr. Wyn Quakerew,  wrap her legs in Unna boots to get the swelling under control. Compression, elevation, increased activity, and weight loss are all going to be essential to get her leg swelling better. Follow-up as outpatient.  Morbid obesity.  Diet control and exercise, follow-up PCP.  Generalized weakness.  Ambulate patient.  I discussed with Dr. Orland Jarredroxler and Dr. dew. All the records are reviewed and case discussed with Care Management/Social Worker. Management plans discussed with the patient, sister and they are in agreement.  CODE STATUS: Full Code  TOTAL TIME TAKING CARE OF THIS PATIENT: 27 minutes.   More than 50% of the time was spent in counseling/coordination of care: YES  POSSIBLE D/C IN 2 DAYS, DEPENDING ON CLINICAL CONDITION.   Shaune PollackQing Kamaury Cutbirth M.D on 09/13/2018 at 3:07 PM  Between 7am to 6pm - Pager - [phone]  After 6pm go to [email or web address] - Therapist, nutritionalpassword [email or web address]  [facility]  Hospitalists

## 2018-09-13 NOTE — Consult Note (Signed)
[facility] Vascular Consult Note  MRN : [id number]  Danielle Munoz is a 77 y.o. (July 14, 1941) female who presents with chief complaint of  Chief Complaint  Patient presents with  . Foot Pain  .  History of Present Illness: I am asked to see the patient by Dr. Imogene Burn for bilateral lower extremity swelling, discoloration, and multiple scabs.  The patient has been having problems with leg swelling for many years.  She is morbidly obese with multiple medical issues as listed below.  Her skin discoloration has gradually progressed over months to years.  She denies any trauma, injury, or previous DVTs to her knowledge.  She is very overweight and sedentary with her legs dependent much of the time.  She says she cannot wear compression stockings as she cannot get them on and off.  Both legs are affected but the right leg may be slightly worse.  Nothing has really helped this over time.  Current Facility-Administered Medications  Medication Dose Route Frequency Provider Last Rate Last Dose  . 0.9 %  sodium chloride infusion   Intravenous PRN Shaune Pollack, MD 10 mL/hr at 09/13/18 0254 1,000 mL at 09/13/18 0254  . acetaminophen (TYLENOL) tablet 650 mg  650 mg Oral Q6H PRN Arnaldo Natal, MD   650 mg at 09/13/18 7829   Or  . acetaminophen (TYLENOL) suppository 650 mg  650 mg Rectal Q6H PRN Arnaldo Natal, MD      . amiodarone (PACERONE) tablet 200 mg  200 mg Oral Daily Shaune Pollack, MD   200 mg at 09/13/18 0956  . apixaban (ELIQUIS) tablet 5 mg  5 mg Oral BID Shaune Pollack, MD   5 mg at 09/13/18 5621  . bisacodyl (DULCOLAX) EC tablet 5 mg  5 mg Oral Daily PRN Shaune Pollack, MD      . cefTRIAXone (ROCEPHIN) 2 g in sodium chloride 0.9 % 100 mL IVPB  2 g Intravenous Q24H Arnaldo Natal, MD 200 mL/hr at 09/13/18 0300 2 g at 09/13/18 0300  . docusate sodium (COLACE) capsule 100 mg  100 mg Oral BID Arnaldo Natal, MD   100 mg at 09/13/18 0956  . furosemide (LASIX) injection 40 mg   40 mg Intravenous Q12H Shaune Pollack, MD   40 mg at 09/13/18 0700  . labetalol (NORMODYNE,TRANDATE) injection 5-10 mg  5-10 mg Intravenous Q2H PRN Arnaldo Natal, MD      . levothyroxine (SYNTHROID, LEVOTHROID) tablet 150 mcg  150 mcg Oral Q0600 Shaune Pollack, MD   150 mcg at 09/13/18 0708  . lisinopril (PRINIVIL,ZESTRIL) tablet 20 mg  20 mg Oral Daily Shaune Pollack, MD   20 mg at 09/13/18 0956  . metoprolol tartrate (LOPRESSOR) tablet 25 mg  25 mg Oral BID Shaune Pollack, MD   25 mg at 09/13/18 0956  . ondansetron (ZOFRAN) tablet 4 mg  4 mg Oral Q6H PRN Arnaldo Natal, MD       Or  . ondansetron [facility]) injection 4 mg  4 mg Intravenous Q6H PRN Arnaldo Natal, MD      . senna-docusate (Senokot-S) tablet 1 tablet  1 tablet Oral QHS PRN Shaune Pollack, MD        Past Medical History:  Diagnosis Date  . Atrial flutter (HCC)    a. s/p TEE/DCCV 10/18 @ [facility] complicated by asystole and junctional rhythm requiring epi, atropine, and CPR x < 1 minute; b. amio and Eliquis; c. CHADS2VASC => 6 (CHF, HTN, age  x 2, vascular disease, female)  . Chronic combined systolic and diastolic CHF (congestive heart failure) (HCC)   . CKD (chronic kidney disease), stage II   . COPD (chronic obstructive pulmonary disease) (HCC)   . Hypertension   . Hypothyroidism   . Iron deficiency anemia   . Morbid obesity (HCC)   . Noncompliance     Past Surgical History:  Procedure Laterality Date  . HERNIA REPAIR      Social History Social History   Tobacco Use  . Smoking status: Former Smoker    Types: Cigarettes  . Smokeless tobacco: Never Used  Substance Use Topics  . Alcohol use: No    Frequency: Never  . Drug use: No    Family History Family History  Problem Relation Age of Onset  . Diabetes Brother   . CAD Mother   . CAD Father   . Breast cancer Sister     No Known Allergies   REVIEW OF SYSTEMS (Negative unless checked)  Constitutional: [] Weight loss  [] Fever  [] Chills Cardiac: [] Chest pain    [] Chest pressure   [x] Palpitations   [] Shortness of breath when laying flat   [] Shortness of breath at rest   [x] Shortness of breath with exertion. Vascular:  [x] Pain in legs with walking   [x] Pain in legs at rest   [x] Pain in legs when laying flat   [] Claudication   [] Pain in feet when walking  [] Pain in feet at rest  [] Pain in feet when laying flat   [] History of DVT   [] Phlebitis   [x] Swelling in legs   [] Varicose veins   [x] Non-healing ulcers Pulmonary:   [] Uses home oxygen   [] Productive cough   [] Hemoptysis   [] Wheeze  [x] COPD   [] Asthma Neurologic:  [] Dizziness  [] Blackouts   [] Seizures   [] History of stroke   [] History of TIA  [] Aphasia   [] Temporary blindness   [] Dysphagia   [] Weakness or numbness in arms   [] Weakness or numbness in legs Musculoskeletal:  [x] Arthritis   [] Joint swelling   [] Joint pain   [] Low back pain Hematologic:  [] Easy bruising  [] Easy bleeding   [] Hypercoagulable state   [x] Anemic  [] Hepatitis Gastrointestinal:  [] Blood in stool   [] Vomiting blood  [] Gastroesophageal reflux/heartburn   [] Difficulty swallowing. Genitourinary:  [x] Chronic kidney disease   [] Difficult urination  [] Frequent urination  [] Burning with urination   [] Blood in urine Skin:  [] Rashes   [x] Ulcers   [x] Wounds Psychological:  [] History of anxiety   []  History of major depression.  Physical Examination  Vitals:   09/13/18 0230 09/13/18 0507 09/13/18 0757 09/13/18 0956  BP:  115/61 103/65 131/72  Pulse:  (!) 111 (!) 110 (!) 113  Resp:   20   Temp:  (!) 97.5 F (36.4 C) 97.9 F (36.6 C)   TempSrc:  Oral Oral   SpO2:  93% 99%   Weight: 119.4 kg     Height:       Body mass index is 51.4 kg/m. Gen: Morbidly obese, somewhat disheveled, NAD Head: [place]/AT, No temporalis wasting.  Ear/Nose/Throat: Hearing grossly intact, nares w/o erythema or drainage, oropharynx w/o Erythema/Exudate Eyes: Sclera non-icteric, conjunctiva clear Neck: Trachea midline.  No JVD.  Pulmonary:  Good air movement,  respirations not labored, equal bilaterally.  Cardiac: Irregular and tachycardic Vascular: Both lower extremities with moderate edema and moderate stasis dermatitis changes slightly worse on the right than the left. Vessel Right Left  Radial Palpable Palpable  PT  not palpable  not palpable  DP  trace palpable  trace palpable   Gastrointestinal: soft, non-tender/non-distended.  Musculoskeletal: M/S 5/5 throughout.  Extremities without ischemic changes.  No deformity or atrophy.  2+ bilateral lower extremity edema.  Multiple small scabs a little more present on the right leg in the.  Stasis changes as above. Neurologic: Sensation grossly intact in extremities.  Symmetrical.  Speech is fluent. Motor exam as listed above. Psychiatric: Judgment intact, Mood & affect appropriate for pt's clinical situation. Dermatologic: Multiple small scabs throughout the calf and lower leg area bilaterally a little more prominent on the right leg than the left leg      CBC Lab Results  Component Value Date   WBC 12.2 (H) 09/12/2018   HGB 11.1 (L) 09/12/2018   HCT 39.7 09/12/2018   MCV 74.6 (L) 09/12/2018   PLT 353 09/12/2018    BMET    Component Value Date/Time   NA 144 09/13/2018 0615   K 3.5 09/13/2018 0615   CL 103 09/13/2018 0615   CO2 34 (H) 09/13/2018 0615   GLUCOSE 93 09/13/2018 0615   BUN 20 09/13/2018 0615   CREATININE 1.05 (H) 09/13/2018 0615   CALCIUM 8.0 (L) 09/13/2018 0615   GFRNONAA 52 (L) 09/13/2018 0615   GFRAA 60 (L) 09/13/2018 0615   Estimated Creatinine Clearance: 54 mL/min (A) (by C-G formula based on SCr of 1.05 mg/dL (H)).  COAG No results found for: INR, PROTIME  Radiology Dg Chest 2 View  Result Date: 09/12/2018 CLINICAL DATA:  77 year old female with shortness of breath. EXAM: CHEST - 2 VIEW COMPARISON:  Chest radiograph dated 11/08/2017 FINDINGS: There is cardiomegaly with vascular congestion. Diffuse interstitial coarsening and  bronchitic changes. Left lung base density may represent combination of a small pleural effusion or atelectasis although infiltrate is not excluded. Clinical correlation is recommended. No pneumothorax. Atherosclerotic calcification of the aortic arch. Osteopenia with degenerative changes of the spine. No acute osseous pathology. IMPRESSION: 1. Cardiomegaly with vascular congestion . 2. Left lung base atelectasis versus infiltrate. Possible small left pleural effusion. Electronically Signed   By: Elgie Collard M.D.   On: 09/12/2018 03:57      Assessment/Plan 1.  Marked lower extremity swelling and stasis dermatitis.  Given her morbid obesity, her heart disease and renal insufficiency, she has a litany of reasons why her legs are chronically swollen.  She keeps her legs dependent and does not use compression so I am not surprised with the degree of swelling.  We will try to wrap her legs in Unna boots today to get the swelling under control.  An outpatient venous evaluation would be prudent if she will agree to this.  She may also benefit from a lymphedema pump.  Compression, elevation, increased activity, and weight loss are all going to be essential to get her leg swelling better. 2.  Ulceration bilateral calves.  Shallow scabs from venous stasis changes.  Managed with weekly Unna boot changes for several weeks.  We can do the Unna boot changes as an outpatient once she is discharged from the hospital if she would like. 3.  Morbid obesity.  Clearly worsens her lower extremity swelling and weight loss would be of great benefit 4.  Heart disease/arrhythmia/congestive heart failure.  This would also be a cause of lower extremity swelling of her cardiac function is poor. 5.  Hypertension.  Stable on outpatient medications and blood pressure control important in reducing the progression of atherosclerotic disease. On appropriate  oral medications.    Festus Barren, MD  09/13/2018 11:15 AM    This note  was created with Dragon medical transcription system.  Any error is purely unintentional

## 2018-09-13 NOTE — Plan of Care (Signed)
  Problem: Health Behavior/Discharge Planning: Goal: Ability to manage health-related needs will improve Outcome: Progressing   Problem: Clinical Measurements: Goal: Ability to maintain clinical measurements within normal limits will improve Outcome: Progressing Goal: Will remain free from infection Outcome: Progressing Goal: Diagnostic test results will improve Outcome: Progressing Goal: Respiratory complications will improve Outcome: Progressing Goal: Cardiovascular complication will be avoided Outcome: Progressing   Problem: Coping: Goal: Level of anxiety will decrease Outcome: Progressing   Problem: Elimination: Goal: Will not experience complications related to bowel motility Outcome: Progressing Goal: Will not experience complications related to urinary retention Outcome: Progressing   Problem: Pain Managment: Goal: General experience of comfort will improve Outcome: Progressing   Problem: Safety: Goal: Ability to remain free from injury will improve Outcome: Progressing   Problem: Skin Integrity: Goal: Risk for impaired skin integrity will decrease Outcome: Progressing   Problem: Fluid Volume: Goal: Hemodynamic stability will improve Outcome: Progressing   Problem: Clinical Measurements: Goal: Diagnostic test results will improve Outcome: Progressing Goal: Signs and symptoms of infection will decrease Outcome: Progressing   Problem: Education: Goal: Ability to demonstrate management of disease process will improve Outcome: Progressing Goal: Ability to verbalize understanding of medication therapies will improve Outcome: Progressing Goal: Individualized Educational Video(s) Outcome: Progressing   Problem: Activity: Goal: Capacity to carry out activities will improve Outcome: Progressing   Problem: Cardiac: Goal: Ability to achieve and maintain adequate cardiopulmonary perfusion will improve Outcome: Progressing

## 2018-09-13 NOTE — Progress Notes (Signed)
Patient refused lasix 40mg  dose at 1700, BP 98/67, HR 115.

## 2018-09-13 NOTE — Progress Notes (Signed)
[facility] Podiatry                                                      Patient Demographics  Danielle Munoz, is a 77 y.o. female   MRN: [id number]   DOB - [birth year]  Admit Date - 09/12/2018    Outpatient Primary MD for the patient is Clinic, [facility]  Consult requested in the Hospital by Shaune Pollack, MD, On 09/13/2018   With History of -  Past Medical History:  Diagnosis Date  . Atrial flutter (HCC)    a. s/p TEE/DCCV 10/18 @ [facility] complicated by asystole and junctional rhythm requiring epi, atropine, and CPR x < 1 minute; b. amio and Eliquis; c. CHADS2VASC => 6 (CHF, HTN, age x 2, vascular disease, female)  . Chronic combined systolic and diastolic CHF (congestive heart failure) (HCC)   . CKD (chronic kidney disease), stage II   . COPD (chronic obstructive pulmonary disease) (HCC)   . Hypertension   . Hypothyroidism   . Iron deficiency anemia   . Morbid obesity (HCC)   . Noncompliance       Past Surgical History:  Procedure Laterality Date  . HERNIA REPAIR      in for   Chief Complaint  Patient presents with  . Foot Pain     HPI  Danielle Munoz  is a 77 y.o. female, patient had injured her left great toenail she is uncertain as to how.  I debrided the nail back yesterday and cleaned up and started with a wet-to-dry dressing.  She is somewhat antagonistic in her responses so is hard to get a feel on how much pain she is actually having.    Social History Social History   Tobacco Use  . Smoking status: Former Smoker    Types: Cigarettes  . Smokeless tobacco: Never Used  Substance Use Topics  . Alcohol use: No    Frequency: Never    Family History Family History  Problem Relation Age of Onset  . Diabetes Brother   . CAD Mother   . CAD Father   . Breast cancer Sister     Prior to Admission medications   Medication Sig  Start Date End Date Taking? Authorizing Provider  albuterol (PROVENTIL HFA;VENTOLIN HFA) 108 (90 Base) MCG/ACT inhaler Inhale 2 puffs into the lungs every 6 (six) hours as needed for wheezing or shortness of breath.   Yes [provider]  amiodarone (PACERONE) 200 MG tablet Take 1 tablet (200 mg total) by mouth 2 (two) times daily. Take 1 tablet twice a day for 1 week and then 1 tablet once a day Patient taking differently: Take 100 mg by mouth daily.  11/11/17  Yes Enid Baas, MD  atorvastatin (LIPITOR) 40 MG tablet Take 40 mg by mouth daily.   Yes [provider]  docusate sodium (COLACE) 100 MG capsule Take 200 mg by mouth daily.   Yes [provider]  ferrous sulfate 325 (65 FE) MG tablet Take 325 mg by mouth daily with breakfast.   Yes [provider]  levothyroxine (SYNTHROID, LEVOTHROID) 100 MCG tablet Take 100 mcg by mouth daily before breakfast.   Yes [provider]  lisinopril (PRINIVIL,ZESTRIL) 20 MG tablet Take 1 tablet (20 mg total) by mouth daily. 11/12/17  Yes Enid Baas, MD  Tiotropium Bromide-Olodaterol (STIOLTO RESPIMAT) 2.5-2.5 MCG/ACT AERS Inhale 2 puffs into the lungs daily.   Yes [provider]  torsemide (DEMADEX) 20 MG tablet Take 40 mg by mouth daily.   Yes [provider]  apixaban (ELIQUIS) 5 MG TABS tablet Take 5 mg by mouth 2 (two) times daily. 07/16/17   [provider]  chlorpheniramine-HYDROcodone (TUSSIONEX PENNKINETIC ER) 10-8 MG/5ML SUER Take 5 mLs by mouth every 12 (twelve) hours as needed for cough. Patient not taking: Reported on 09/12/2018 11/11/17   Enid BaasKalisetti, Radhika, MD  furosemide (LASIX) 40 MG tablet Take 1 tablet (40 mg total) by mouth daily. 11/11/17 02/09/18  Enid BaasKalisetti, Radhika, MD  levothyroxine (SYNTHROID, LEVOTHROID) 100 MCG tablet Take 100 mcg by mouth daily. 07/18/17 07/18/18  [provider]  predniSONE (STERAPRED UNI-PAK 21 TAB) 10 MG (21) TBPK tablet 6  tabs PO x 1 day 5 tabs PO x 1 day 4 tabs PO x 1 day 3 tabs PO x 1 day 2 tabs PO x 1 day 1 tab PO x 1 day and stop Patient not taking: Reported on 09/12/2018 11/11/17   Enid BaasKalisetti, Radhika, MD    Anti-infectives (From admission, onward)   Start     Dose/Rate Route Frequency Ordered Stop   09/12/18 0330  cefTRIAXone (ROCEPHIN) 2 g in sodium chloride 0.9 % 100 mL IVPB     2 g 200 mL/hr over 30 Minutes Intravenous Every 24 hours 09/12/18 0317        Scheduled Meds: . amiodarone  200 mg Oral Daily  . apixaban  5 mg Oral BID  . docusate sodium  100 mg Oral BID  . furosemide  40 mg Intravenous Q12H  . levothyroxine  150 mcg Oral Q0600  . lisinopril  20 mg Oral Daily  . metoprolol tartrate  25 mg Oral BID   Continuous Infusions: . sodium chloride 1,000 mL (09/13/18 0254)  . cefTRIAXone (ROCEPHIN)  IV 2 g (09/13/18 0300)   PRN Meds:.sodium chloride, acetaminophen **OR** acetaminophen, bisacodyl, labetalol, ondansetron **OR** ondansetron (ZOFRAN) IV, senna-docusate  No Known Allergies  Physical Exam patient is okay this morning she is just very grumpy I think by nature.  She also has no tolerance for movement or touch to the foot or the toes anywhere for that matter.  Vitals  Blood pressure 103/65, pulse (!) 110, temperature 97.9 F (36.6 C), temperature source Oral, resp. rate 20, height 5' (1.524 m), weight 119.4 kg, SpO2 99 %.  Lower Extremity exam: Toes doing much better there is a granular base with no real sign of infection to the region at this timeframe.  Still has significant venous and lymphedema and needs a vascular evaluation and long-term management of that. Data Review  CBC Recent Labs  Lab 09/12/18 0247  WBC 12.2*  HGB 11.1*  HCT 39.7  PLT 353  MCV 74.6*  MCH 20.9*  MCHC 28.0*  RDW 20.5*  LYMPHSABS 1.0  MONOABS 1.1*  EOSABS 0.4  BASOSABS 0.1   ------------------------------------------------------------------------------------------- Assessment & Plan:  Continue wet-to-dry saline dressings on the toe.  Change daily.  Should go on to heal up over the next week.  Would recommend her wearing an open shoe like a postop shoe will order 1 of those for today.  She needs a vascular evaluation for arterial as well as venous lymphedema studies.  I think the arterial flow is probably okay but the lymphedema and venous stasis needs to be managed on a chronic long-term basis.  Active Problems:  Sepsis [facility])   Family Communication: Plan discussed with patient   Recardo Evangelist M.D on 09/13/2018 at 9:23 AM  Thank you for the consult, we will follow the patient with you in the Hospital.

## 2018-09-13 NOTE — Consult Note (Signed)
[facility]                                                      Patient Demographics  Danielle Munoz, is a 77 y.o. female   MRN: [id number]   DOB - 04/15/1941  Admit Date - 09/12/2018    Outpatient Primary MD for the patient is Clinic, [facility]  Consult requested in the Hospital by Shaune Pollack, MD, On 09/13/2018    Reason for consult injury to left hallux nail.  This consult was done on 1213  With History of -  Past Medical History:  Diagnosis Date  . Atrial flutter (HCC)    a. s/p TEE/DCCV 10/18 @ [facility] complicated by asystole and junctional rhythm requiring epi, atropine, and CPR x < 1 minute; b. amio and Eliquis; c. CHADS2VASC => 6 (CHF, HTN, age x 2, vascular disease, female)  . Chronic combined systolic and diastolic CHF (congestive heart failure) (HCC)   . CKD (chronic kidney disease), stage II   . COPD (chronic obstructive pulmonary disease) (HCC)   . Hypertension   . Hypothyroidism   . Iron deficiency anemia   . Morbid obesity (HCC)   . Noncompliance       Past Surgical History:  Procedure Laterality Date  . HERNIA REPAIR      in for   Chief Complaint  Patient presents with  . Foot Pain     HPI  Danielle Munoz  is a 77 y.o. female, patient states that she pulled the toenail up on the left great toe several days ago.  She also has cellulitis in both legs and was admitted for that as well.    Review of Systems    In addition to the HPI above,  No Fever-chills, No Headache, No changes with Vision or hearing, No problems swallowing food or Liquids, No Chest pain, Cough some chronic shortness of breath but nothing new or unusual, No Abdominal pain, No Nausea or Vommitting, Bowel movements are regular, No Blood in stool or Urine, No dysuria, No new skin rashes or bruises, No new joints pains-aches,  No new weakness,  tingling, numbness in any extremity, patient does have chronic severe edema to both lower legs No recent weight gain or loss, No polyuria, polydypsia or polyphagia, No significant Mental Stressors.  A full 10 point Review of Systems was done, except as stated above, all other Review of Systems were negative.   Social History Social History   Tobacco Use  . Smoking status: Former Smoker    Types: Cigarettes  . Smokeless tobacco: Never Used  Substance Use Topics  . Alcohol use: No    Frequency: Never    Family History Family History  Problem Relation Age of Onset  . Diabetes Brother   . CAD Mother   . CAD Father   . Breast cancer Sister     Prior to Admission medications   Medication Sig Start Date End Date Taking? Authorizing Provider  albuterol (PROVENTIL HFA;VENTOLIN HFA) 108 (90 Base) MCG/ACT inhaler Inhale 2 puffs into the lungs every 6 (six) hours as needed for wheezing or shortness of breath.   Yes [provider]  amiodarone (PACERONE) 200 MG tablet Take 1 tablet (200 mg total) by mouth 2 (two) times daily. Take 1 tablet twice a day for 1 week  and then 1 tablet once a day Patient taking differently: Take 100 mg by mouth daily.  11/11/17  Yes Enid Baas, MD  atorvastatin (LIPITOR) 40 MG tablet Take 40 mg by mouth daily.   Yes [provider]  docusate sodium (COLACE) 100 MG capsule Take 200 mg by mouth daily.   Yes [provider]  ferrous sulfate 325 (65 FE) MG tablet Take 325 mg by mouth daily with breakfast.   Yes [provider]  levothyroxine (SYNTHROID, LEVOTHROID) 100 MCG tablet Take 100 mcg by mouth daily before breakfast.   Yes [provider]  lisinopril (PRINIVIL,ZESTRIL) 20 MG tablet Take 1 tablet (20 mg total) by mouth daily. 11/12/17  Yes Enid Baas, MD  Tiotropium Bromide-Olodaterol (STIOLTO RESPIMAT) 2.5-2.5 MCG/ACT AERS Inhale 2 puffs into the lungs daily.   Yes [provider]  torsemide  (DEMADEX) 20 MG tablet Take 40 mg by mouth daily.   Yes [provider]  apixaban (ELIQUIS) 5 MG TABS tablet Take 5 mg by mouth 2 (two) times daily. 07/16/17   [provider]  chlorpheniramine-HYDROcodone (TUSSIONEX PENNKINETIC ER) 10-8 MG/5ML SUER Take 5 mLs by mouth every 12 (twelve) hours as needed for cough. Patient not taking: Reported on 09/12/2018 11/11/17   Enid Baas, MD  furosemide (LASIX) 40 MG tablet Take 1 tablet (40 mg total) by mouth daily. 11/11/17 02/09/18  Enid Baas, MD  levothyroxine (SYNTHROID, LEVOTHROID) 100 MCG tablet Take 100 mcg by mouth daily. 07/18/17 07/18/18  [provider]  predniSONE (STERAPRED UNI-PAK 21 TAB) 10 MG (21) TBPK tablet 6 tabs PO x 1 day 5 tabs PO x 1 day 4 tabs PO x 1 day 3 tabs PO x 1 day 2 tabs PO x 1 day 1 tab PO x 1 day and stop Patient not taking: Reported on 09/12/2018 11/11/17   Enid Baas, MD    Anti-infectives (From admission, onward)   Start     Dose/Rate Route Frequency Ordered Stop   09/12/18 0330  cefTRIAXone (ROCEPHIN) 2 g in sodium chloride 0.9 % 100 mL IVPB     2 g 200 mL/hr over 30 Minutes Intravenous Every 24 hours 09/12/18 0317        Scheduled Meds: . amiodarone  200 mg Oral Daily  . apixaban  5 mg Oral BID  . docusate sodium  100 mg Oral BID  . furosemide  40 mg Intravenous Q12H  . levothyroxine  150 mcg Oral Q0600  . lisinopril  20 mg Oral Daily  . metoprolol tartrate  25 mg Oral BID   Continuous Infusions: . sodium chloride 1,000 mL (09/13/18 0254)  . cefTRIAXone (ROCEPHIN)  IV 2 g (09/13/18 0300)   PRN Meds:.sodium chloride, acetaminophen **OR** acetaminophen, bisacodyl, labetalol, ondansetron **OR** ondansetron (ZOFRAN) IV, senna-docusate  No Known Allergies  Physical Exam  Vitals  Blood pressure 103/65, pulse (!) 110, temperature 97.9 F (36.6 C), temperature source Oral, resp. rate 20, height 5' (1.524 m), weight 119.4 kg, SpO2 99 %.  Lower Extremity  exam:  Vascular: She is got significant swelling to both legs and feet so it is difficult to palpate but I think I could palpate a DP pulse on the right foot.  She has severe lymphedema and venous stasis edema with some subsequent inflammation of the skin possibly some cellulitis in the lower extremities this point.  No evidence of recurrent ulcerations or drainage regions.  Dermatological: Patient has a partially avulsed left hallux nail there is some purulent drainage on the  nail bed at this point not direct heavy pus but sort of a buildup of debris on the toe.  Was about two thirds of the way avulsed.  Other nails are extremely thickened and deformed at this point.  Neurological: Patient apparently is sensate to both feet  Ortho: Deferred at this point.  She does not appear to have any fractures dislocations or significant deformities  Data Review  CBC Recent Labs  Lab 09/12/18 0247  WBC 12.2*  HGB 11.1*  HCT 39.7  PLT 353  MCV 74.6*  MCH 20.9*  MCHC 28.0*  RDW 20.5*  LYMPHSABS 1.0  MONOABS 1.1*  EOSABS 0.4  BASOSABS 0.1   ------------------------------------------------------------------------------------------------------------------  Chemistries  Recent Labs  Lab 09/12/18 0247 09/13/18 0615  NA 142 144  K 3.6 3.5  CL 105 103  CO2 31 34*  GLUCOSE 121* 93  BUN 20 20  CREATININE 1.03* 1.05*  CALCIUM 8.3* 8.0*  MG  --  2.2  AST 19  --   ALT 13  --   ALKPHOS 78  --   BILITOT 0.6  --    ------------------------------------------------------------------------------------------------------------------ estimated creatinine clearance is 54 mL/min (A) (by C-G formula based on SCr of 1.05 mg/dL (H)). ------------------------------------------------------------------------------------------------------------------ Recent Labs    09/12/18 0547  TSH 40.794*   Urinalysis No results found for: COLORURINE, APPEARANCEUR, LABSPEC, PHURINE, GLUCOSEU, HGBUR, BILIRUBINUR,  KETONESUR, PROTEINUR, UROBILINOGEN, NITRITE, LEUKOCYTESUR   Imaging results:   Dg Chest 2 View  Result Date: 09/12/2018 CLINICAL DATA:  77 year old female with shortness of breath. EXAM: CHEST - 2 VIEW COMPARISON:  Chest radiograph dated 11/08/2017 FINDINGS: There is cardiomegaly with vascular congestion. Diffuse interstitial coarsening and bronchitic changes. Left lung base density may represent combination of a small pleural effusion or atelectasis although infiltrate is not excluded. Clinical correlation is recommended. No pneumothorax. Atherosclerotic calcification of the aortic arch. Osteopenia with degenerative changes of the spine. No acute osseous pathology. IMPRESSION: 1. Cardiomegaly with vascular congestion . 2. Left lung base atelectasis versus infiltrate. Possible small left pleural effusion. Electronically Signed   By: Elgie CollardArash  Radparvar M.D.   On: 09/12/2018 03:57    Assessment & Plan: Using a cutting forceps I debrided back the partially avulsed nail as far as I could.  Was able to remove the loose portion of it.  Also debrided some of the necrotic tissue on the dorsal aspect of the nailbed.  Started with a wet-to-dry saline dressing to that region I did continue to get her antibiotics.  I will get a culture of this tomorrow morning. Active Problems:   Sepsis [facility](HCC)   Family Communication: Plan discussed with patient and **  Recardo EvangelistMatthew Rylyn Ranganathan M.D on 09/13/2018 at 9:07 AM  Thank you for the consult, we will follow the patient with you in the Hospital.

## 2018-09-14 LAB — BASIC METABOLIC PANEL
Anion gap: 6 (ref 5–15)
BUN: 27 mg/dL — ABNORMAL HIGH (ref 8–23)
CHLORIDE: 103 mmol/L (ref 98–111)
CO2: 35 mmol/L — ABNORMAL HIGH (ref 22–32)
Calcium: 8.2 mg/dL — ABNORMAL LOW (ref 8.9–10.3)
Creatinine, Ser: 1.07 mg/dL — ABNORMAL HIGH (ref 0.44–1.00)
GFR calc Af Amer: 58 mL/min — ABNORMAL LOW (ref >60)
GFR calc non Af Amer: 50 mL/min — ABNORMAL LOW (ref >60)
Glucose, Bld: 104 mg/dL — ABNORMAL HIGH (ref 70–99)
Potassium: 3.7 mmol/L (ref 3.5–5.1)
Sodium: 144 mmol/L (ref 135–145)

## 2018-09-14 MED ORDER — CEPHALEXIN 500 MG PO CAPS
500.0000 mg | ORAL_CAPSULE | Freq: Two times a day (BID) | ORAL | Status: DC
  Administered 2018-09-14 – 2018-09-15 (×2): 500 mg via ORAL
  Filled 2018-09-14 (×2): qty 1

## 2018-09-14 MED ORDER — FUROSEMIDE 10 MG/ML IJ SOLN: 20.0000 mg | Freq: Two times a day (BID) | INTRAMUSCULAR | Status: DC

## 2018-09-14 MED ORDER — FUROSEMIDE 20 MG PO TABS
20.0000 mg | ORAL_TABLET | Freq: Two times a day (BID) | ORAL | Status: DC
  Administered 2018-09-14 – 2018-09-15 (×3): 20 mg via ORAL
  Filled 2018-09-14 (×3): qty 1

## 2018-09-14 MED ORDER — OXYCODONE-ACETAMINOPHEN 5-325 MG PO TABS
1.0000 | ORAL_TABLET | Freq: Four times a day (QID) | ORAL | Status: DC | PRN
  Administered 2018-09-14: 1 via ORAL
  Filled 2018-09-14: qty 1

## 2018-09-14 MED ORDER — LISINOPRIL 10 MG PO TABS
10.0000 mg | ORAL_TABLET | Freq: Every day | ORAL | Status: DC
  Administered 2018-09-14 – 2018-09-15 (×2): 10 mg via ORAL
  Filled 2018-09-14 (×2): qty 1

## 2018-09-14 NOTE — Consult Note (Signed)
Cardiology Consultation Note    [name], MRN: [id number], DOB/AGE: September 04, 1941 77 y.o. Admit date: 09/12/2018   Date of Consult: 09/14/2018 Primary Physician: [facility], [facility] Primary Cardiologist: Dr. Arvilla MeresLaura Doss, [facility]  Chief Complaint: leg pain and swelling Reason for Consultation: tachcyardia Requesting MD: Dr. Imogene BurnHen  HPI: Danielle Munoz is a 77 y.o. female  who has a history of paroxysmal atrial flutter s/p TEE/DCCV 07/2017, accelerated junctional rhythm, COPD, morbid obesity, hypertension. [facility] admitted with sob and leg swelling and pain. She has had hospitalizations for HFpEF and COPD.  She was seen by vascular surgery yesterday for lower extremity swelling and lower extremity discomfort.  She has been noncompliant with compression stockings due to the fact is difficult for her to wear them.  Discussion regarding further compression devices to include Unna boots versus lymphedema pump.  Consultation was also obtained from podiatry.  Patient is on apixaban at home and states she is compliant.  Chest x-ray on admission revealed cardiomegaly with vascular congestion with a possible small left pleural effusion.  EKG revealed sinus tachycardia.  She is ruled out for myocardial infarction.  Serum creatinine is mildly increased and patient has a diagnosis of stage II chronic kidney disease.  Echocardiogram done in February of this year revealed ejection fraction of 60 to 65% with no significant valvular abnormalities.   Past Medical History:  Diagnosis Date  . Atrial flutter (HCC)    a. s/p TEE/DCCV 10/18 @ [facility] complicated by asystole and junctional rhythm requiring epi, atropine, and CPR x < 1 minute; b. amio and Eliquis; c. CHADS2VASC => 6 (CHF, HTN, age x 2, vascular disease, female)  . Chronic combined systolic and diastolic CHF (congestive heart failure) (HCC)   . CKD (chronic kidney disease), stage II   . COPD (chronic obstructive  pulmonary disease) (HCC)   . Hypertension   . Hypothyroidism   . Iron deficiency anemia   . Morbid obesity (HCC)   . Noncompliance       Surgical History:  Past Surgical History:  Procedure Laterality Date  . HERNIA REPAIR       Home Meds: Prior to Admission medications   Medication Sig Start Date End Date Taking? Authorizing Provider  albuterol (PROVENTIL HFA;VENTOLIN HFA) 108 (90 Base) MCG/ACT inhaler Inhale 2 puffs into the lungs every 6 (six) hours as needed for wheezing or shortness of breath.   Yes [provider]  amiodarone (PACERONE) 200 MG tablet Take 1 tablet (200 mg total) by mouth 2 (two) times daily. Take 1 tablet twice a day for 1 week and then 1 tablet once a day Patient taking differently: Take 100 mg by mouth daily.  11/11/17  Yes Enid BaasKalisetti, Radhika, MD  atorvastatin (LIPITOR) 40 MG tablet Take 40 mg by mouth daily.   Yes [provider]  docusate sodium (COLACE) 100 MG capsule Take 200 mg by mouth daily.   Yes [provider]  ferrous sulfate 325 (65 FE) MG tablet Take 325 mg by mouth daily with breakfast.   Yes [provider]  levothyroxine (SYNTHROID, LEVOTHROID) 100 MCG tablet Take 100 mcg by mouth daily before breakfast.   Yes [provider]  lisinopril (PRINIVIL,ZESTRIL) 20 MG tablet Take 1 tablet (20 mg total) by mouth daily. 11/12/17  Yes Enid BaasKalisetti, Radhika, MD  Tiotropium Bromide-Olodaterol (STIOLTO RESPIMAT) 2.5-2.5 MCG/ACT AERS Inhale 2 puffs into the lungs daily.   Yes [provider]  torsemide (DEMADEX) 20 MG  tablet Take 40 mg by mouth daily.   Yes [provider]  apixaban (ELIQUIS) 5 MG TABS tablet Take 5 mg by mouth 2 (two) times daily. 07/16/17   [provider]  chlorpheniramine-HYDROcodone (TUSSIONEX PENNKINETIC ER) 10-8 MG/5ML SUER Take 5 mLs by mouth every 12 (twelve) hours as needed for cough. Patient not taking: Reported on 09/12/2018 11/11/17   Enid Baas, MD   furosemide (LASIX) 40 MG tablet Take 1 tablet (40 mg total) by mouth daily. 11/11/17 02/09/18  Enid Baas, MD  levothyroxine (SYNTHROID, LEVOTHROID) 100 MCG tablet Take 100 mcg by mouth daily. 07/18/17 07/18/18  [provider]  predniSONE (STERAPRED UNI-PAK 21 TAB) 10 MG (21) TBPK tablet 6 tabs PO x 1 day 5 tabs PO x 1 day 4 tabs PO x 1 day 3 tabs PO x 1 day 2 tabs PO x 1 day 1 tab PO x 1 day and stop Patient not taking: Reported on 09/12/2018 11/11/17   Enid Baas, MD    Inpatient Medications:  . amiodarone  200 mg Oral Daily  . apixaban  5 mg Oral BID  . docusate sodium  100 mg Oral BID  . furosemide  20 mg Intravenous Q12H  . levothyroxine  150 mcg Oral Q0600  . lisinopril  10 mg Oral Daily  . metoprolol tartrate  50 mg Oral BID   . sodium chloride 1,000 mL (09/13/18 0254)  . cefTRIAXone (ROCEPHIN)  IV 2 g (09/14/18 0437)    Allergies: No Known Allergies  Social History   Socioeconomic History  . Marital status: Single    Spouse name: Not on file  . Number of children: Not on file  . Years of education: Not on file  . Highest education level: Not on file  Occupational History  . Not on file  Social Needs  . Financial resource strain: Not on file  . Food insecurity:    Worry: Not on file    Inability: Not on file  . Transportation needs:    Medical: Not on file    Non-medical: Not on file  Tobacco Use  . Smoking status: Former Smoker    Types: Cigarettes  . Smokeless tobacco: Never Used  Substance and Sexual Activity  . Alcohol use: No    Frequency: Never  . Drug use: No  . Sexual activity: Not on file  Lifestyle  . Physical activity:    Days per week: Not on file    Minutes per session: Not on file  . Stress: Not on file  Relationships  . Social connections:    Talks on phone: Not on file    Gets together: Not on file    Attends religious service: Not on file    Active member of club or organization: Not on file    Attends  meetings of clubs or organizations: Not on file    Relationship status: Not on file  . Intimate partner violence:    Fear of current or ex partner: Not on file    Emotionally abused: Not on file    Physically abused: Not on file    Forced sexual activity: Not on file  Other Topics Concern  . Not on file  Social History Narrative  . Not on file     Family History  Problem Relation Age of Onset  . Diabetes Brother   . CAD Mother   . CAD Father   . Breast cancer Sister      Review of Systems:  A 12-system review of systems was performed and is negative except as noted in the HPI.  Labs: No results for input(s): CKTOTAL, CKMB, TROPONINI in the last 72 hours. Lab Results  Component Value Date   WBC 12.2 (H) 09/12/2018   HGB 11.1 (L) 09/12/2018   HCT 39.7 09/12/2018   MCV 74.6 (L) 09/12/2018   PLT 353 09/12/2018    Recent Labs  Lab 09/12/18 0247  09/14/18 0523  NA 142   < > 144  K 3.6   < > 3.7  CL 105   < > 103  CO2 31   < > 35*  BUN 20   < > 27*  CREATININE 1.03*   < > 1.07*  CALCIUM 8.3*   < > 8.2*  PROT 6.7  --   --   BILITOT 0.6  --   --   ALKPHOS 78  --   --   ALT 13  --   --   AST 19  --   --   GLUCOSE 121*   < > 104*   < > = values in this interval not displayed.   No results found for: CHOL, HDL, LDLCALC, TRIG No results found for: DDIMER  Radiology/Studies:  Dg Chest 2 View  Result Date: 09/12/2018 CLINICAL DATA:  77 year old female with shortness of breath. EXAM: CHEST - 2 VIEW COMPARISON:  Chest radiograph dated 11/08/2017 FINDINGS: There is cardiomegaly with vascular congestion. Diffuse interstitial coarsening and bronchitic changes. Left lung base density may represent combination of a small pleural effusion or atelectasis although infiltrate is not excluded. Clinical correlation is recommended. No pneumothorax. Atherosclerotic calcification of the aortic arch. Osteopenia with degenerative changes of the spine. No acute osseous pathology. IMPRESSION:  1. Cardiomegaly with vascular congestion . 2. Left lung base atelectasis versus infiltrate. Possible small left pleural effusion. Electronically Signed   By: Elgie Collard M.D.   On: 09/12/2018 03:57    Wt Readings from Last 3 Encounters:  09/14/18 120 kg  11/11/17 116.5 kg    EKG:    Physical Exam:  Blood pressure 103/73, pulse (!) 116, temperature 97.7 F (36.5 C), temperature source Oral, resp. rate 20, height 5' (1.524 m), weight 120 kg, SpO2 93 %. Body mass index is 51.68 kg/m. General: Well developed, well nourished, in no acute distress. Head: Normocephalic, atraumatic, sclera non-icteric, no xanthomas, nares are without discharge.  Neck: Negative for carotid bruits. JVD not elevated. Lungs: Clear bilaterally to auscultation without wheezes, rales, or rhonchi. Breathing is unlabored. Heart: RRR with S1 S2. No murmurs, rubs, or gallops appreciated. Abdomen: Soft, non-tender, non-distended with normoactive bowel sounds. No hepatomegaly. No rebound/guarding. No obvious abdominal masses. Msk:  Strength and tone appear normal for age. Extremities: No clubbing or cyanosis. No edema.  Distal pedal pulses are 2+ and equal bilaterally. Neuro: Alert and oriented X 3. No facial asymmetry. No focal deficit. Moves all extremities spontaneously. Psych:  Responds to questions appropriately with a normal affect.     Assessment and Plan  Sinus tachycardia. Currently stable . Continue with current therapy.   Signed, Dalia Heading MD 09/14/2018, 11:17 AM Pager: [phone]

## 2018-09-14 NOTE — Progress Notes (Signed)
Patient's sock has since been removed since wound care performed, which really helps to secure wound dressing. Will encourage patient to wear sock to help anchor dressing, even if slightly uncomfortable d/t edema/cellulitis. Will continue to monitor. Jari FavreSteven M [facility]

## 2018-09-14 NOTE — Plan of Care (Signed)
  Problem: Activity: Goal: Risk for activity intolerance will decrease Outcome: Progressing   Problem: Safety: Goal: Ability to remain free from injury will improve Outcome: Progressing   

## 2018-09-14 NOTE — Plan of Care (Signed)
  Problem: Clinical Measurements: Goal: Ability to maintain clinical measurements within normal limits will improve Outcome: Not Progressing Note:  Patient's B.U.N. level is elevated today at 27. Will continue to monitor renal function labs while patient remains on IV Lasix. Jari FavreSteven M [facility]

## 2018-09-14 NOTE — Progress Notes (Signed)
Changed patient's L toe wound dressing at this time. Used 1 2x2 gauze pad and a Kerlix roll cut in half, as ordered to begin at 5 AM today Q daily. Patient tolerated fairly. Also moved patient back up into bed. Will continue to monitor wound dressing status. Jari FavreSteven M [facility]

## 2018-09-14 NOTE — Progress Notes (Addendum)
Pt requested to walk around nurses station. Pt requested to not wear oxygen as she states" she only wear oxygen at home at bedtime". Pt desat to 71 % heading out her rm. Pt was instructed to go back to bed. Pt was place back on oxygen and sat 98%. Will continue to monitor.  Update [id number]. Pt requested to sit in the chair but refused chair alarm. Pt was alert x4. Pt was educated about safety. Will continue to monitor.

## 2018-09-14 NOTE — Progress Notes (Signed)
[employer] [facility] [place] [facility]   PATIENT NAME: Danielle Munoz    MR#:  [id number]  DATE OF BIRTH:  [birth year]  SUBJECTIVE:  CHIEF COMPLAINT:   Chief Complaint  Patient presents with  . Foot Pain   The patient has cough and shortness of breath.  HR is about 110's. On O2 [place] 2 L (home O2) REVIEW OF SYSTEMS:  Review of Systems  Constitutional: Positive for malaise/fatigue. Negative for chills and fever.  HENT: Negative for sore throat.   Eyes: Negative for blurred vision and double vision.  Respiratory: Negative for cough, hemoptysis, sputum production, shortness of breath, wheezing and stridor.   Cardiovascular: Positive for leg swelling. Negative for chest pain, palpitations and orthopnea.  Gastrointestinal: Negative for abdominal pain, blood in stool, diarrhea, melena, nausea and vomiting.  Genitourinary: Negative for dysuria, flank pain and hematuria.  Musculoskeletal: Negative for back pain and joint pain.        b/l leg edema and tenderness.    Skin: Negative for rash.  Neurological: Negative for dizziness, sensory change, focal weakness, seizures, loss of consciousness, weakness and headaches.  Endo/Heme/Allergies: Negative for polydipsia.  Psychiatric/Behavioral: Negative for depression. The patient is not nervous/anxious.     DRUG ALLERGIES:  No Known Allergies VITALS:  Blood pressure 102/88, pulse (!) 110, temperature 98.4 F (36.9 C), temperature source Oral, resp. rate 20, height 5' (1.524 m), weight 120 kg, SpO2 91 %. PHYSICAL EXAMINATION:  Physical Exam Constitutional:      General: She is not in acute distress.    Comments: Morbid obesity  HENT:     Head: Normocephalic.     Mouth/Throat:     Mouth: Mucous membranes are moist.  Eyes:     General: No scleral icterus.    Conjunctiva/sclera: Conjunctivae normal.     Pupils: Pupils are equal, round, and reactive to light.  Neck:     Musculoskeletal: Normal range of motion and neck  supple.     Vascular: No JVD.     Trachea: No tracheal deviation.  Cardiovascular:     Rate and Rhythm: Tachycardia present. Rhythm irregular.     Heart sounds: Normal heart sounds. No murmur. No gallop.   Pulmonary:     Effort: Pulmonary effort is normal. No respiratory distress.     Breath sounds: Normal breath sounds. No wheezing or rales.  Abdominal:     General: Bowel sounds are normal. There is no distension.     Palpations: Abdomen is soft.     Tenderness: There is no abdominal tenderness. There is no rebound.  Musculoskeletal: Normal range of motion.        General: Swelling and tenderness present.     Right lower leg: Edema present.     Left lower leg: Edema present.  Skin:    Findings: No erythema or rash.  Neurological:     General: No focal deficit present.     Mental Status: She is alert and oriented to person, place, and time.     Cranial Nerves: No cranial nerve deficit.  Psychiatric:        Mood and Affect: Mood normal.    LABORATORY PANEL:  Female CBC Recent Labs  Lab 09/12/18 0247  WBC 12.2*  HGB 11.1*  HCT 39.7  PLT 353   ------------------------------------------------------------------------------------------------------------------ Chemistries  Recent Labs  Lab 09/12/18 0247 09/13/18 0615 09/14/18 0523  NA 142 144 144  K 3.6 3.5 3.7  CL 105 103 103  CO2 31  34* 35*  GLUCOSE 121* 93 104*  BUN 20 20 27*  CREATININE 1.03* 1.05* 1.07*  CALCIUM 8.3* 8.0* 8.2*  MG  --  2.2  --   AST 19  --   --   ALT 13  --   --   ALKPHOS 78  --   --   BILITOT 0.6  --   --    RADIOLOGY:  No results found. ASSESSMENT AND PLAN:   This is a 77 year old female admitted for cellulitis.  1.  Sepsis due to Cellulitis: Bilateral lower extremities; continue ceftriaxone.    2.  Afib with RVR; continue Eliquis and amiodarone, increase lopressor per Dr. Lady GaryFath.  3.   Acute on chronic combined CHF  Hold torsemide, decrease Lasix to 20 mg twice daily due to mild  dehydration.  4.  Hypertension: controlled, continue home meds. Labetalol as needed. 5. Chronic respiratory failure and COPD. Stable.  Marked lower extremity swelling and stasis dermatitis. Per Dr. Wyn Quakerew,  wrap her legs in Unna boots to get the swelling under control. Compression, elevation, increased activity, and weight loss are all going to be essential to get her leg swelling better. Follow-up as outpatient.  Morbid obesity.  Diet control and exercise, follow-up PCP.  Generalized weakness.  Ambulate patient.  I discussed with Dr. Lady GaryFath. All the records are reviewed and case discussed with Care Management/Social Worker. Management plans discussed with the patient, and they are in agreement.  CODE STATUS: Full Code  TOTAL TIME TAKING CARE OF THIS PATIENT: 25 minutes.   More than 50% of the time was spent in counseling/coordination of care: YES  POSSIBLE D/C IN 2 DAYS, DEPENDING ON CLINICAL CONDITION.   Shaune PollackQing Ivet Guerrieri M.D on 09/14/2018 at 4:16 PM  Between 7am to 6pm - Pager - [phone]  After 6pm go to [email or web address] - Therapist, nutritionalpassword [email or web address]  [facility]

## 2018-09-14 NOTE — Progress Notes (Addendum)
There is a renewed order to apply [employer] this afternoon, but I don't have the necessary supplies to apply them. Called Supply Chain to obtain them, but no one answered the phone. Patient was already supposed to have [employer] applied, but since at least overnight she hasn't (for reasons unknown). Will enter a consult to the WOC RN to apply these in the AM. Will pass along in shift report. Raynald BlendSteven M Lakoda Raske  Patient states at this time she is refusing Unna Boots anyway. Will continue to monitor for remainder of shift in case patient changes her mind. Jari FavreSteven M [facility]

## 2018-09-15 ENCOUNTER — Telehealth (INDEPENDENT_AMBULATORY_CARE_PROVIDER_SITE_OTHER): Payer: Self-pay | Admitting: Vascular Surgery

## 2018-09-15 LAB — BASIC METABOLIC PANEL
ANION GAP: 9 (ref 5–15)
BUN: 32 mg/dL — ABNORMAL HIGH (ref 8–23)
CO2: 33 mmol/L — ABNORMAL HIGH (ref 22–32)
Calcium: 8.5 mg/dL — ABNORMAL LOW (ref 8.9–10.3)
Chloride: 99 mmol/L (ref 98–111)
Creatinine, Ser: 1.3 mg/dL — ABNORMAL HIGH (ref 0.44–1.00)
GFR calc Af Amer: 46 mL/min — ABNORMAL LOW (ref >60)
GFR calc non Af Amer: 40 mL/min — ABNORMAL LOW (ref >60)
Glucose, Bld: 111 mg/dL — ABNORMAL HIGH (ref 70–99)
Potassium: 4.2 mmol/L (ref 3.5–5.1)
Sodium: 141 mmol/L (ref 135–145)

## 2018-09-15 MED ORDER — CEPHALEXIN 500 MG PO CAPS: 500.0000 mg | ORAL_CAPSULE | Freq: Two times a day (BID) | ORAL | 0 refills | Status: AC

## 2018-09-15 MED ORDER — METOPROLOL TARTRATE 50 MG PO TABS: 50.0000 mg | ORAL_TABLET | Freq: Two times a day (BID) | ORAL | 1 refills | Status: DC

## 2018-09-15 NOTE — Care Management (Signed)
RNCM [facility].

## 2018-09-15 NOTE — Discharge Summary (Signed)
[facility] [place] at [facility]   PATIENT NAME: Danielle Munoz    MR#:  [id number]  DATE OF BIRTH:  1940-11-27  DATE OF ADMISSION:  09/12/2018   ADMITTING PHYSICIAN: Arnaldo Natal, MD  DATE OF DISCHARGE: 09/15/2018  PRIMARY CARE PHYSICIAN: Clinic, [facility]   ADMISSION DIAGNOSIS:  Cellulitis of abdominal wall [L03.311] Panniculitis [M79.3] Cellulitis of lower extremity, unspecified laterality [L03.119] Sepsis, due to unspecified organism, unspecified whether acute organ dysfunction present (HCC) [A41.9] DISCHARGE DIAGNOSIS:  Active Problems:   Sepsis (HCC)  SECONDARY DIAGNOSIS:   Past Medical History:  Diagnosis Date  . Atrial flutter (HCC)    a. s/p TEE/DCCV 10/18 @ [facility] complicated by asystole and junctional rhythm requiring epi, atropine, and CPR x < 1 minute; b. amio and Eliquis; c. CHADS2VASC => 6 (CHF, HTN, age x 2, vascular disease, female)  . Chronic combined systolic and diastolic CHF (congestive heart failure) (HCC)   . CKD (chronic kidney disease), stage II   . COPD (chronic obstructive pulmonary disease) (HCC)   . Hypertension   . Hypothyroidism   . Iron deficiency anemia   . Morbid obesity (HCC)   . Noncompliance    HOSPITAL COURSE:  This is a 77 year old female admitted for cellulitis.  1. Sepsis due to Cellulitis: Bilateral lower extremities; improved with ceftriaxone. Change to p.o. Keflex.  2. Afib with RVR; continue Eliquis and amiodarone, increased lopressor to 50 mg twice daily per Dr. Lady Gary.  3.  Acute on chronic combined CHF Hold torsemide, decreased Lasix to 20 mg twice daily due to mild dehydration.  Resume home torsemide after discharge.  4. Hypertension: controlled, continue home meds.Labetalol as needed. 5. Chronic respiratory failure and COPD. Stable.  Marked lower extremity swelling and stasis dermatitis. Per Dr. Wyn Quaker, wrap her legs in Unna boots to get the swelling under control. Compression,  elevation, increased activity, and weight loss are all going to be essential to get her leg swelling better. Follow-up as outpatient.  Morbid obesity.  Diet control and exercise, follow-up PCP.  Generalized weakness.    Home health and PT.  DISCHARGE CONDITIONS:  Stable, discharge to home with home health and PT today. CONSULTS OBTAINED:  Treatment Team:  Annice Needy, MD Dalia Heading, MD DRUG ALLERGIES:  No Known Allergies DISCHARGE MEDICATIONS:   Allergies as of 09/15/2018   No Known Allergies     Medication List    STOP taking these medications   chlorpheniramine-HYDROcodone 10-8 MG/5ML Suer Commonly known as:  TUSSIONEX PENNKINETIC ER   furosemide 40 MG tablet Commonly known as:  LASIX   predniSONE 10 MG (21) Tbpk tablet Commonly known as:  STERAPRED UNI-PAK 21 TAB     TAKE these medications   albuterol 108 (90 Base) MCG/ACT inhaler Commonly known as:  PROVENTIL HFA;VENTOLIN HFA Inhale 2 puffs into the lungs every 6 (six) hours as needed for wheezing or shortness of breath.   amiodarone 200 MG tablet Commonly known as:  PACERONE Take 1 tablet (200 mg total) by mouth 2 (two) times daily. Take 1 tablet twice a day for 1 week and then 1 tablet once a day What changed:    how much to take  when to take this  additional instructions   atorvastatin 40 MG tablet Commonly known as:  LIPITOR Take 40 mg by mouth daily.   cephALEXin 500 MG capsule Commonly known as:  KEFLEX Take 1 capsule (500 mg total) by mouth every 12 (twelve) hours for 4  days.   docusate sodium 100 MG capsule Commonly known as:  COLACE Take 200 mg by mouth daily.   ELIQUIS 5 MG Tabs tablet Generic drug:  apixaban Take 5 mg by mouth 2 (two) times daily.   ferrous sulfate 325 (65 FE) MG tablet Take 325 mg by mouth daily with breakfast.   levothyroxine 100 MCG tablet Commonly known as:  SYNTHROID, LEVOTHROID Take 100 mcg by mouth daily before breakfast. What changed:  Another  medication with the same name was removed. Continue taking this medication, and follow the directions you see here.   lisinopril 20 MG tablet Commonly known as:  PRINIVIL,ZESTRIL Take 1 tablet (20 mg total) by mouth daily.   metoprolol tartrate 50 MG tablet Commonly known as:  LOPRESSOR Take 1 tablet (50 mg total) by mouth 2 (two) times daily.   STIOLTO RESPIMAT 2.5-2.5 MCG/ACT Aers Generic drug:  Tiotropium Bromide-Olodaterol Inhale 2 puffs into the lungs daily.   torsemide 20 MG tablet Commonly known as:  DEMADEX Take 40 mg by mouth daily.        DISCHARGE INSTRUCTIONS:  See AVS.  If you experience worsening of your admission symptoms, develop shortness of breath, life threatening emergency, suicidal or homicidal thoughts you must seek medical attention immediately by calling 911 or calling your MD immediately  if symptoms less severe.  You Must read complete instructions/literature along with all the possible adverse reactions/side effects for all the Medicines you take and that have been prescribed to you. Take any new Medicines after you have completely understood and accpet all the possible adverse reactions/side effects.   Please note  You were cared for by a hospitalist during your hospital stay. If you have any questions about your discharge medications or the care you received while you were in the hospital after you are discharged, you can call the unit and asked to speak with the hospitalist on call if the hospitalist that took care of you is not available. Once you are discharged, your primary care physician will handle any further medical issues. Please note that NO REFILLS for any discharge medications will be authorized once you are discharged, as it is imperative that you return to your primary care physician (or establish a relationship with a primary care physician if you do not have one) for your aftercare needs so that they can reassess your need for medications  and monitor your lab values.    On the day of Discharge:  VITAL SIGNS:  Blood pressure 104/73, pulse (!) 110, temperature 99.2 F (37.3 C), temperature source Oral, resp. rate 20, height 5' (1.524 m), weight 120 kg, SpO2 95 %. PHYSICAL EXAMINATION:  GENERAL:  77 y.o.-year-old patient lying in the bed with no acute distress.  Obesity. EYES: Pupils equal, round, reactive to light and accommodation. No scleral icterus. Extraocular muscles intact.  HEENT: Head atraumatic, normocephalic. Oropharynx and nasopharynx clear.  NECK:  Supple, no jugular venous distention. No thyroid enlargement, no tenderness.  LUNGS: Normal breath sounds bilaterally, no wheezing, rales,rhonchi or crepitation. No use of accessory muscles of respiration.  CARDIOVASCULAR: S1, S2 normal. No murmurs, rubs, or gallops.  ABDOMEN: Soft, non-tender, non-distended. Bowel sounds present. No organomegaly or mass.  EXTREMITIES: No cyanosis, or clubbing.  Bilateral leg swelling, in dressing with Unna boots. NEUROLOGIC: Cranial nerves II through XII are intact. Muscle strength 5/5 in all extremities. Sensation intact. Gait not checked.  PSYCHIATRIC: The patient is alert and oriented x 3.  SKIN: No obvious rash, lesion, or  ulcer.  DATA REVIEW:   CBC Recent Labs  Lab 09/12/18 0247  WBC 12.2*  HGB 11.1*  HCT 39.7  PLT 353    Chemistries  Recent Labs  Lab 09/12/18 0247 09/13/18 0615  09/15/18 0735  NA 142 144   < > 141  K 3.6 3.5   < > 4.2  CL 105 103   < > 99  CO2 31 34*   < > 33*  GLUCOSE 121* 93   < > 111*  BUN 20 20   < > 32*  CREATININE 1.03* 1.05*   < > 1.30*  CALCIUM 8.3* 8.0*   < > 8.5*  MG  --  2.2  --   --   AST 19  --   --   --   ALT 13  --   --   --   ALKPHOS 78  --   --   --   BILITOT 0.6  --   --   --    < > = values in this interval not displayed.     Microbiology Results  Results for orders placed or performed during the hospital encounter of 09/12/18  Blood Culture (routine x 2)      Status: None (Preliminary result)   Collection Time: 09/12/18  3:27 AM  Result Value Ref Range Status   Specimen Description BLOOD LEFT UPPER ARM  Final   Special Requests   Final    BOTTLES DRAWN AEROBIC AND ANAEROBIC Blood Culture adequate volume   Culture   Final    NO GROWTH 3 DAYS Performed at [facility], [address], [place], [place] [address]    Report Status PENDING  Incomplete  Blood Culture (routine x 2)     Status: None (Preliminary result)   Collection Time: 09/12/18  3:28 AM  Result Value Ref Range Status   Specimen Description BLOOD BLOOD LEFT HAND  Final   Special Requests   Final    BOTTLES DRAWN AEROBIC AND ANAEROBIC Blood Culture adequate volume   Culture   Final    NO GROWTH 3 DAYS Performed at [facility], [address] [place], [place] [address]    Report Status PENDING  Incomplete    RADIOLOGY:  No results found.   Management plans discussed with the patient, family and they are in agreement.  CODE STATUS: Full Code   TOTAL TIME TAKING CARE OF THIS PATIENT: 35 minutes.    Shaune Pollack M.D on 09/15/2018 at 1:06 PM  Between 7am to 6pm - Pager - [phone]  After 6pm go to [email or web address] - Social research officer, government  [facility] [place] [facility]  Office  [phone]  CC: Primary care physician; Clinic, [facility]   Note: This dictation was prepared with Dragon dictation along with smaller phrase technology. Any transcriptional errors that result from this process are unintentional.

## 2018-09-15 NOTE — Progress Notes (Signed)
Danielle MoatsMarian L Kuhner to be D/C'd Home per MD order.  Discussed prescriptions and follow up appointments with the patient and daughter. Prescriptions given to patient, medication list explained in detail. Pt and daughter verbalized understanding.  Allergies as of 09/15/2018   No Known Allergies     Medication List    STOP taking these medications   chlorpheniramine-HYDROcodone 10-8 MG/5ML Suer Commonly known as:  TUSSIONEX PENNKINETIC ER   furosemide 40 MG tablet Commonly known as:  LASIX   predniSONE 10 MG (21) Tbpk tablet Commonly known as:  STERAPRED UNI-PAK 21 TAB     TAKE these medications   albuterol 108 (90 Base) MCG/ACT inhaler Commonly known as:  PROVENTIL HFA;VENTOLIN HFA Inhale 2 puffs into the lungs every 6 (six) hours as needed for wheezing or shortness of breath.   amiodarone 200 MG tablet Commonly known as:  PACERONE Take 1 tablet (200 mg total) by mouth 2 (two) times daily. Take 1 tablet twice a day for 1 week and then 1 tablet once a day What changed:    how much to take  when to take this  additional instructions   atorvastatin 40 MG tablet Commonly known as:  LIPITOR Take 40 mg by mouth daily.   cephALEXin 500 MG capsule Commonly known as:  KEFLEX Take 1 capsule (500 mg total) by mouth every 12 (twelve) hours for 4 days.   docusate sodium 100 MG capsule Commonly known as:  COLACE Take 200 mg by mouth daily.   ELIQUIS 5 MG Tabs tablet Generic drug:  apixaban Take 5 mg by mouth 2 (two) times daily.   ferrous sulfate 325 (65 FE) MG tablet Take 325 mg by mouth daily with breakfast.   levothyroxine 100 MCG tablet Commonly known as:  SYNTHROID, LEVOTHROID Take 100 mcg by mouth daily before breakfast. What changed:  Another medication with the same name was removed. Continue taking this medication, and follow the directions you see here.   lisinopril 20 MG tablet Commonly known as:  PRINIVIL,ZESTRIL Take 1 tablet (20 mg total) by mouth daily.    metoprolol tartrate 50 MG tablet Commonly known as:  LOPRESSOR Take 1 tablet (50 mg total) by mouth 2 (two) times daily.   STIOLTO RESPIMAT 2.5-2.5 MCG/ACT Aers Generic drug:  Tiotropium Bromide-Olodaterol Inhale 2 puffs into the lungs daily.   torsemide 20 MG tablet Commonly known as:  DEMADEX Take 40 mg by mouth daily.       Vitals:   09/15/18 0447 09/15/18 0821  BP: 118/69 104/73  Pulse: 88 (!) 110  Resp: 18 20  Temp:  99.2 F (37.3 C)  SpO2: 99% 95%    Skin clean, dry and intact without evidence of skin break down, no evidence of skin tears noted. IV catheter discontinued intact. Site without signs and symptoms of complications. Dressing and pressure applied. Pt denies pain at this time. No complaints noted.  An After Visit Summary was printed and given to the patient. Patient escorted via WC, and D/C home via private auto.Daughter will transport pt home.  Danielle NoelErica Y Nahiem Dredge

## 2018-09-15 NOTE — Discharge Instructions (Signed)
Pt will need home health nursing assistance for weekly Una boot changes HHPT Fall precaution.

## 2018-09-15 NOTE — Care Management Note (Signed)
Case Management Note  Patient Details  Name: Danielle Munoz MRN: [id number] Date of Birth: 1941-07-25  Subjective/Objective:     Presented home health list to patient on Friday.  She chose [facility].  [facility]  Patient is discharging today.  Danielle CowerJason aware of discharge.                 Action/Plan:   Expected Discharge Date:  09/15/18               Expected Discharge Plan:  Home w Home Health Services  In-House Referral:     Discharge planning Services  CM Consult  Post Acute Care Choice:  Home Health Choice offered to:  Patient  DME Arranged:    DME Agency:     HH Arranged:  RN, PT, Nurse's Aide, Social Work [employer] Agency:  [facility]  Status of Service:  Completed, signed off  If discussed at [employer] Meetings, dates discussed:    Additional Comments:  Sherren KernsJennifer L Torre Pikus, RN 09/15/2018, 1:54 PM

## 2018-09-15 NOTE — Consult Note (Signed)
WOC Nurse wound consult note Reason for Consult: Consult requested to apply bilat Una boots; Vascular team following for assessment and plan of care; refer to their previous consult note.  Also refer to podiatry notes for assessment and plan of care to left great toenail site. Pt has worn Una boots before and states she does not like them, but reluctantly agreed to have them applied this morning.  She states if she is unable to tolerate them later, she will request the bedside nurse to remove them. Wound type: Patchy areas of dry scabs and red dry partial thickness wounds, generalized edema to BLE. No drainage or full thickness wounds.   Dressing procedure/placement/frequency: Applied bilat Una boots and coban.  If patient is still in the hospital on Monday, WOC will change on that date. Pt will need home health nursing assistance for weekly Una boot changes. No family present to discuss plan of care. Please re-consult if further assistance is needed.  Thank-you,  Cammie Mcgeeawn Geneveive Furness MSN, RN, CWOCN, [place]CN-AP, CNS [phone]

## 2018-09-15 NOTE — Care Management Important Message (Signed)
Copy of signed IM left with patient in room.  

## 2018-09-17 ENCOUNTER — Telehealth (INDEPENDENT_AMBULATORY_CARE_PROVIDER_SITE_OTHER): Payer: Self-pay

## 2018-09-17 LAB — CULTURE, BLOOD (ROUTINE X 2)
CULTURE: NO GROWTH
Culture: NO GROWTH
SPECIAL REQUESTS: ADEQUATE
Special Requests: ADEQUATE

## 2018-09-17 NOTE — Telephone Encounter (Signed)
Amy from Home Health called and left a message on the triage line and stated that she went to see the patient today and that she denied unna boots from her and denied them in the hospital as well. She would like to know what to do now with the patient. Please advise, thank you.  Call back number- [phone]

## 2018-09-17 NOTE — Telephone Encounter (Signed)
Based on the notes we saw Ms. Danielle Munoz as a consult in the hospital. We have not yet seen her in the office.  She also does not have a visit scheduled. We can attempt to call her to schedule a follow up visit and explain the importance of the unna wraps for her wounds.  If we she is unwilling to come in for a visit and/or do unna wraps,then there would be nothing that we can do to compel her to receive care.    Tammy, please reach out the the patient and see if she will come to be seen with ABIs and a Venous Reflux.  If she refuses, please document it. Thank you.

## 2018-09-18 ENCOUNTER — Telehealth (INDEPENDENT_AMBULATORY_CARE_PROVIDER_SITE_OTHER): Payer: Self-pay | Admitting: Nurse Practitioner

## 2018-09-19 ENCOUNTER — Telehealth (INDEPENDENT_AMBULATORY_CARE_PROVIDER_SITE_OTHER): Payer: Self-pay | Admitting: Vascular Surgery

## 2018-09-22 ENCOUNTER — Telehealth: Payer: Self-pay

## 2018-09-22 NOTE — Telephone Encounter (Signed)
Flagged on EMMI report for not knowing who to call about changes in condition.  Called and spoke with patient.  Encouraged her to reach out to her PCP for any questions or issues.  Patient expressed interest in personal care services.  A list of area resources and brochures to be mailed to patient. Verified address to send to.  No other questions or issues at this time.

## 2018-10-02 ENCOUNTER — Ambulatory Visit: Payer: Medicare Other | Admitting: Family

## 2018-10-06 ENCOUNTER — Emergency Department: Payer: Medicare Other

## 2018-10-06 ENCOUNTER — Inpatient Hospital Stay
Admission: EM | Admit: 2018-10-06 | Discharge: 2018-10-10 | DRG: 242 | Disposition: A | Discharge status: Skilled Nursing Facility | Payer: Medicare Other | Attending: Internal Medicine | Admitting: Internal Medicine

## 2018-10-06 ENCOUNTER — Encounter (INDEPENDENT_AMBULATORY_CARE_PROVIDER_SITE_OTHER): Payer: Medicare Other

## 2018-10-06 ENCOUNTER — Ambulatory Visit (INDEPENDENT_AMBULATORY_CARE_PROVIDER_SITE_OTHER): Payer: Medicare Other | Admitting: Nurse Practitioner

## 2018-10-06 ENCOUNTER — Encounter: Payer: Self-pay | Admitting: Emergency Medicine

## 2018-10-06 DIAGNOSIS — Z9119 Patient's noncompliance with other medical treatment and regimen: Secondary | ICD-10-CM

## 2018-10-06 DIAGNOSIS — Z79899 Other long term (current) drug therapy: Secondary | ICD-10-CM

## 2018-10-06 DIAGNOSIS — Z87891 Personal history of nicotine dependence: Secondary | ICD-10-CM

## 2018-10-06 DIAGNOSIS — Z8249 Family history of ischemic heart disease and other diseases of the circulatory system: Secondary | ICD-10-CM | POA: Diagnosis not present

## 2018-10-06 DIAGNOSIS — N183 Chronic kidney disease, stage 3 (moderate): Secondary | ICD-10-CM | POA: Diagnosis present

## 2018-10-06 DIAGNOSIS — Z8674 Personal history of sudden cardiac arrest: Secondary | ICD-10-CM

## 2018-10-06 DIAGNOSIS — Z803 Family history of malignant neoplasm of breast: Secondary | ICD-10-CM

## 2018-10-06 DIAGNOSIS — Z833 Family history of diabetes mellitus: Secondary | ICD-10-CM | POA: Diagnosis not present

## 2018-10-06 DIAGNOSIS — N179 Acute kidney failure, unspecified: Secondary | ICD-10-CM | POA: Diagnosis present

## 2018-10-06 DIAGNOSIS — G4730 Sleep apnea, unspecified: Secondary | ICD-10-CM | POA: Diagnosis present

## 2018-10-06 DIAGNOSIS — I459 Conduction disorder, unspecified: Secondary | ICD-10-CM | POA: Diagnosis present

## 2018-10-06 DIAGNOSIS — Z79890 Hormone replacement therapy (postmenopausal): Secondary | ICD-10-CM

## 2018-10-06 DIAGNOSIS — T462X5A Adverse effect of other antidysrhythmic drugs, initial encounter: Secondary | ICD-10-CM | POA: Diagnosis present

## 2018-10-06 DIAGNOSIS — Z950 Presence of cardiac pacemaker: Secondary | ICD-10-CM

## 2018-10-06 DIAGNOSIS — I130 Hypertensive heart and chronic kidney disease with heart failure and stage 1 through stage 4 chronic kidney disease, or unspecified chronic kidney disease: Secondary | ICD-10-CM | POA: Diagnosis present

## 2018-10-06 DIAGNOSIS — J9621 Acute and chronic respiratory failure with hypoxia: Secondary | ICD-10-CM | POA: Diagnosis present

## 2018-10-06 DIAGNOSIS — I248 Other forms of acute ischemic heart disease: Secondary | ICD-10-CM | POA: Diagnosis present

## 2018-10-06 DIAGNOSIS — I4892 Unspecified atrial flutter: Secondary | ICD-10-CM | POA: Diagnosis present

## 2018-10-06 DIAGNOSIS — I480 Paroxysmal atrial fibrillation: Secondary | ICD-10-CM | POA: Diagnosis present

## 2018-10-06 DIAGNOSIS — E039 Hypothyroidism, unspecified: Secondary | ICD-10-CM | POA: Diagnosis present

## 2018-10-06 DIAGNOSIS — Z7901 Long term (current) use of anticoagulants: Secondary | ICD-10-CM | POA: Diagnosis not present

## 2018-10-06 DIAGNOSIS — R001 Bradycardia, unspecified: Principal | ICD-10-CM | POA: Diagnosis present

## 2018-10-06 DIAGNOSIS — J449 Chronic obstructive pulmonary disease, unspecified: Secondary | ICD-10-CM | POA: Diagnosis present

## 2018-10-06 DIAGNOSIS — Z6841 Body Mass Index (BMI) 40.0 and over, adult: Secondary | ICD-10-CM

## 2018-10-06 DIAGNOSIS — E785 Hyperlipidemia, unspecified: Secondary | ICD-10-CM | POA: Diagnosis present

## 2018-10-06 DIAGNOSIS — I5043 Acute on chronic combined systolic (congestive) and diastolic (congestive) heart failure: Secondary | ICD-10-CM | POA: Diagnosis present

## 2018-10-06 DIAGNOSIS — D509 Iron deficiency anemia, unspecified: Secondary | ICD-10-CM | POA: Diagnosis present

## 2018-10-06 DIAGNOSIS — E038 Other specified hypothyroidism: Secondary | ICD-10-CM | POA: Diagnosis not present

## 2018-10-06 DIAGNOSIS — J9620 Acute and chronic respiratory failure, unspecified whether with hypoxia or hypercapnia: Secondary | ICD-10-CM

## 2018-10-06 DIAGNOSIS — Z9114 Patient's other noncompliance with medication regimen: Secondary | ICD-10-CM

## 2018-10-06 DIAGNOSIS — Z9981 Dependence on supplemental oxygen: Secondary | ICD-10-CM

## 2018-10-06 DIAGNOSIS — R0602 Shortness of breath: Secondary | ICD-10-CM

## 2018-10-06 LAB — COMPREHENSIVE METABOLIC PANEL WITH GFR
ALT: 18 U/L (ref 0–44)
AST: 19 U/L (ref 15–41)
Albumin: 3.2 g/dL — ABNORMAL LOW (ref 3.5–5.0)
Alkaline Phosphatase: 99 U/L (ref 38–126)
Anion gap: 10 (ref 5–15)
BUN: 41 mg/dL — ABNORMAL HIGH (ref 8–23)
CO2: 30 mmol/L (ref 22–32)
Calcium: 8.7 mg/dL — ABNORMAL LOW (ref 8.9–10.3)
Chloride: 101 mmol/L (ref 98–111)
Creatinine, Ser: 2.68 mg/dL — ABNORMAL HIGH (ref 0.44–1.00)
GFR calc Af Amer: 19 mL/min — ABNORMAL LOW (ref >60)
GFR calc non Af Amer: 17 mL/min — ABNORMAL LOW (ref >60)
Glucose, Bld: 116 mg/dL — ABNORMAL HIGH (ref 70–99)
Potassium: 4.1 mmol/L (ref 3.5–5.1)
Sodium: 141 mmol/L (ref 135–145)
Total Bilirubin: 0.9 mg/dL (ref 0.3–1.2)
Total Protein: 6.3 g/dL — ABNORMAL LOW (ref 6.5–8.1)

## 2018-10-06 LAB — TROPONIN I: Troponin I: 0.05 ng/mL (ref <0.03)

## 2018-10-06 LAB — BRAIN NATRIURETIC PEPTIDE: B Natriuretic Peptide: 1056 pg/mL — ABNORMAL HIGH (ref 0.0–100.0)

## 2018-10-06 LAB — TSH: TSH: 22.701 u[IU]/mL — ABNORMAL HIGH (ref 0.350–4.500)

## 2018-10-06 LAB — CBC
HCT: 41.2 % (ref 36.0–46.0)
Hemoglobin: 11.6 g/dL — ABNORMAL LOW (ref 12.0–15.0)
MCH: 21.5 pg — ABNORMAL LOW (ref 26.0–34.0)
MCHC: 28.2 g/dL — ABNORMAL LOW (ref 30.0–36.0)
MCV: 76.3 fL — ABNORMAL LOW (ref 80.0–100.0)
Platelets: 263 K/uL (ref 150–400)
RBC: 5.4 MIL/uL — ABNORMAL HIGH (ref 3.87–5.11)
RDW: 23 % — ABNORMAL HIGH (ref 11.5–15.5)
WBC: 11.6 K/uL — ABNORMAL HIGH (ref 4.0–10.5)
nRBC: 0.2 % (ref 0.0–0.2)

## 2018-10-06 MED ORDER — SODIUM CHLORIDE 0.9% FLUSH
3.0000 mL | Freq: Two times a day (BID) | INTRAVENOUS | Status: DC
Start: 2018-10-06 — End: 2018-10-10
  Administered 2018-10-06 – 2018-10-09 (×5): 3 mL via INTRAVENOUS

## 2018-10-06 MED ORDER — ACETAMINOPHEN 325 MG PO TABS
650.0000 mg | ORAL_TABLET | Freq: Four times a day (QID) | ORAL | Status: DC | PRN
  Administered 2018-10-07 – 2018-10-08 (×2): 650 mg via ORAL
  Filled 2018-10-06 (×2): qty 2

## 2018-10-06 MED ORDER — POLYETHYLENE GLYCOL 3350 17 G PO PACK: 17.0000 g | PACK | Freq: Every day | ORAL | Status: DC | PRN

## 2018-10-06 MED ORDER — ONDANSETRON HCL 4 MG/2ML IJ SOLN
4.0000 mg | Freq: Four times a day (QID) | INTRAMUSCULAR | Status: DC | PRN
  Administered 2018-10-07 – 2018-10-09 (×3): 4 mg via INTRAVENOUS
  Filled 2018-10-06 (×3): qty 2

## 2018-10-06 MED ORDER — ATROPINE SULFATE 1 MG/10ML IJ SOSY
0.4000 mg | PREFILLED_SYRINGE | INTRAMUSCULAR | Status: DC | PRN
  Administered 2018-10-07: 0.4 mg via INTRAVENOUS
  Filled 2018-10-06: qty 10

## 2018-10-06 MED ORDER — ATROPINE SULFATE 1 MG/10ML IJ SOSY
0.5000 mg | PREFILLED_SYRINGE | Freq: Once | INTRAMUSCULAR | Status: AC
  Administered 2018-10-06: 0.5 mg via INTRAVENOUS
  Filled 2018-10-06: qty 10

## 2018-10-06 MED ORDER — ALBUTEROL SULFATE (2.5 MG/3ML) 0.083% IN NEBU: 2.5000 mg | INHALATION_SOLUTION | RESPIRATORY_TRACT | Status: DC | PRN

## 2018-10-06 MED ORDER — ONDANSETRON HCL 4 MG PO TABS: 4.0000 mg | ORAL_TABLET | Freq: Four times a day (QID) | ORAL | Status: DC | PRN

## 2018-10-06 MED ORDER — DOCUSATE SODIUM 100 MG PO CAPS
100.0000 mg | ORAL_CAPSULE | Freq: Two times a day (BID) | ORAL | Status: DC
  Administered 2018-10-10: 100 mg via ORAL
  Filled 2018-10-06 (×4): qty 1

## 2018-10-06 MED ORDER — ACETAMINOPHEN 650 MG RE SUPP: 650.0000 mg | Freq: Four times a day (QID) | RECTAL | Status: DC | PRN

## 2018-10-06 MED ORDER — HYDROCORTISONE NA SUCCINATE PF 100 MG IJ SOLR
50.0000 mg | Freq: Once | INTRAMUSCULAR | Status: AC
  Administered 2018-10-06: 50 mg via INTRAVENOUS
  Filled 2018-10-06: qty 2

## 2018-10-06 MED ORDER — LEVOTHYROXINE SODIUM 100 MCG/5ML IV SOLN
50.0000 ug | INTRAVENOUS | Status: AC
  Administered 2018-10-06: 50 ug via INTRAVENOUS
  Filled 2018-10-06: qty 5

## 2018-10-06 NOTE — ED Notes (Signed)
Patient is resting comfortably. 

## 2018-10-06 NOTE — ED Triage Notes (Signed)
Pt in via EMS from [facility] with c/o nausea, and low heart rate. EMS reports pt is mentating well and reports she feels "drunker than hell". Pt denies SOB or pain at this time. Pt does admit that she had chest pain this am.

## 2018-10-06 NOTE — ED Provider Notes (Signed)
[facility] Emergency Department Provider Note  Time seen: 2:09 PM  I have reviewed the triage vital signs and the nursing notes.   HISTORY  Chief Complaint Bradycardia; Nausea; and Emesis    HPI Danielle Munoz is a 78 y.o. female with a past medical history of atrial flutter, CKD, COPD, hypertension, morbid obesity presents to the emergency department for nausea and dizziness.  According EMS report patient lives at [facility] nursing home, was noted to have acute onset of dizziness and nausea this morning.  Found to have a heart rate of 24 this afternoon.  Per patient report she has had low heart rate in the past before.  Patient is awake she is alert she is oriented, very feisty.  Denies any nausea.  Denies any chest pain or shortness of breath.   Past Medical History:  Diagnosis Date  . Atrial flutter (HCC)    a. s/p TEE/DCCV 10/18 @ [facility] complicated by asystole and junctional rhythm requiring epi, atropine, and CPR x < 1 minute; b. amio and Eliquis; c. CHADS2VASC => 6 (CHF, HTN, age x 2, vascular disease, female)  . Chronic combined systolic and diastolic CHF (congestive heart failure) (HCC)   . CKD (chronic kidney disease), stage II   . COPD (chronic obstructive pulmonary disease) (HCC)   . Hypertension   . Hypothyroidism   . Iron deficiency anemia   . Morbid obesity (HCC)   . Noncompliance     Patient Active Problem List   Diagnosis Date Noted  . Sepsis (HCC) 09/12/2018  . Acute on chronic diastolic heart failure (HCC)   . Acute on chronic systolic CHF (congestive heart failure) (HCC) 11/07/2017  . Hypothyroidism 11/07/2017  . HTN (hypertension) 11/07/2017  . COPD (chronic obstructive pulmonary disease) (HCC) 11/07/2017    Past Surgical History:  Procedure Laterality Date  . HERNIA REPAIR      Prior to Admission medications   Medication Sig Start Date End Date Taking? Authorizing Provider  albuterol (PROVENTIL HFA;VENTOLIN HFA) 108 (90  Base) MCG/ACT inhaler Inhale 2 puffs into the lungs every 6 (six) hours as needed for wheezing or shortness of breath.    [provider]  amiodarone (PACERONE) 200 MG tablet Take 1 tablet (200 mg total) by mouth 2 (two) times daily. Take 1 tablet twice a day for 1 week and then 1 tablet once a day Patient taking differently: Take 100 mg by mouth daily.  11/11/17   Enid BaasKalisetti, Radhika, MD  apixaban (ELIQUIS) 5 MG TABS tablet Take 5 mg by mouth 2 (two) times daily. 07/16/17   [provider]  atorvastatin (LIPITOR) 40 MG tablet Take 40 mg by mouth daily.    [provider]  docusate sodium (COLACE) 100 MG capsule Take 200 mg by mouth daily.    [provider]  ferrous sulfate 325 (65 FE) MG tablet Take 325 mg by mouth daily with breakfast.    [provider]  levothyroxine (SYNTHROID, LEVOTHROID) 100 MCG tablet Take 100 mcg by mouth daily before breakfast.    [provider]  lisinopril (PRINIVIL,ZESTRIL) 20 MG tablet Take 1 tablet (20 mg total) by mouth daily. 11/12/17   Enid BaasKalisetti, Radhika, MD  metoprolol tartrate (LOPRESSOR) 50 MG tablet Take 1 tablet (50 mg total) by mouth 2 (two) times daily. 09/15/18   Shaune Pollackhen, Qing, MD  Tiotropium Bromide-Olodaterol (STIOLTO RESPIMAT) 2.5-2.5 MCG/ACT AERS Inhale 2 puffs into the lungs daily.    [provider]  torsemide (DEMADEX) 20 MG  tablet Take 40 mg by mouth daily.    [provider]    No Known Allergies  Family History  Problem Relation Age of Onset  . Diabetes Brother   . CAD Mother   . CAD Father   . Breast cancer Sister     Social History Social History   Tobacco Use  . Smoking status: Former Smoker    Types: Cigarettes  . Smokeless tobacco: Never Used  Substance Use Topics  . Alcohol use: No    Frequency: Never  . Drug use: No    Review of Systems Constitutional: Negative for fever.  Positive for weakness and dizziness today. ENT: Negative for recent  illness/congestion Cardiovascular: Negative for chest pain. Respiratory: Negative for shortness of breath. Gastrointestinal: Negative for abdominal pain.  Positive for nausea but negative for vomiting Musculoskeletal: Negative for musculoskeletal complaints Neurological: Negative for headache All other ROS negative  ____________________________________________   PHYSICAL EXAM:  VITAL SIGNS: ED Triage Vitals [10/06/18 1354]  Enc Vitals Group     BP      Pulse      Resp      Temp      Temp src      SpO2      Weight 275 lb (124.7 kg)     Height 5' (1.524 m)     Head Circumference      Peak Flow      Pain Score 0     Pain Loc      Pain Edu?      Excl. in GC?    Constitutional: Patient is awake, alert and oriented.  Morbidly obese.  States she feels dizzy but denies any other specific complaints.  Patient is very lively, joking loudly with nursing staff and myself. Eyes: Normal exam ENT   Head: Normocephalic and atraumatic.   Mouth/Throat: Mucous membranes are moist. Cardiovascular: Normal rate, regular rhythm. No murmur Respiratory: Normal respiratory effort without tachypnea nor retractions. Breath sounds are clear Gastrointestinal: Soft and nontender. No distention. Musculoskeletal: Nontender with normal range of motion in all extremities.  Neurologic:  Normal speech and language. No gross focal neurologic deficits Skin:  Skin is warm, dry and intact.  Psychiatric: Mood and affect are normal.   ____________________________________________    EKG  EKG viewed and interpreted by myself shows bradycardia at 30 bpm, rhythm is not entirely clear, A. fib versus ventricular rhythm.  Slightly widened QRS, rightward axis deviation.  No ST elevation.  ____________________________________________    RADIOLOGY  Chest x-ray consistent with pulmonary edema  ____________________________________________   INITIAL IMPRESSION / ASSESSMENT AND PLAN / ED  COURSE  Pertinent labs & imaging results that were available during my care of the patient were reviewed by me and considered in my medical decision making (see chart for details).  Patient presents to the emergency department for weakness, dizziness and nausea found to be extremely bradycardic with a heart rate of 24.  Patient continues to be bradycardic in the emergency department.  Patient is very much awake alert oriented, very lively joking loudly with staff members and myself.  Does states she feels dizzy, does state nausea but denies any chest pain or shortness of breath.  We will check labs, dose atropine 0.5 mg, lightly IV hydrate while awaiting lab results.  We will discuss with cardiology.  His labs have resulted showing acute renal insufficiency baseline around 1-1.1, creatinine currently 2.6.  Patient is bradycardic currently in the mid to upper 30s but is  largely asymptomatic blood pressure remained stable currently 111/72.  Patient is on amiodarone for heart rate control.  I suspect the patient is in a degree of amiodarone toxicity secondary to acute renal insufficiency leading to bradycardia.  Patient is mentating well.  I discussed with Dr. Juliann Pares of cardiology, believe the patient could safely be treated at [facility].  We will admit to the hospital service for continued treatment.  Peripheral IV insertion by physician Indication: Multiple failed attempts by nursing staff, need for IV access and/or blood samples for workup Performed under continuous real-time ultrasound visualization Area cleaned with chlorhexidine. 20-gauge IV successfully placed in the right antecubital fossa. 1 attempt, no complications, EBL 0.   CRITICAL CARE Performed by: Minna Antis   Total critical care time: 45 minutes  Critical care time was exclusive of separately billable procedures and treating other patients.  Critical care was necessary to treat or prevent imminent or life-threatening  deterioration.  Critical care was time spent personally by me on the following activities: development of treatment plan with patient and/or surrogate as well as nursing, discussions with consultants, evaluation of patient's response to treatment, examination of patient, obtaining history from patient or surrogate, ordering and performing treatments and interventions, ordering and review of laboratory studies, ordering and review of radiographic studies, pulse oximetry and re-evaluation of patient's condition.  ____________________________________________   FINAL CLINICAL IMPRESSION(S) / ED DIAGNOSES  Symptomatic bradycardia   Minna Antis, MD 10/06/18 [phone]

## 2018-10-06 NOTE — ED Notes (Addendum)
Call to ICU, pt can not go up.

## 2018-10-06 NOTE — ED Notes (Signed)
Call to ICU.  Room not ready

## 2018-10-06 NOTE — H&P (Signed)
[employer] [facility] [place] [facility]   PATIENT NAME: Danielle Munoz    MR#:  [id number]  DATE OF BIRTH:  [birth year]  DATE OF ADMISSION:  10/06/2018  PRIMARY CARE PHYSICIAN: Clinic[facility]   REQUESTING/REFERRING PHYSICIAN: Dr. Lenard Lance  CHIEF COMPLAINT:   Chief Complaint  Patient presents with  . Bradycardia  . Nausea  . Emesis    HISTORY OF PRESENT ILLNESS:  Danielle Munoz  is a 78 y.o. female with a known history of atrial fibrillation on amiodarone, diastolic CHF, hypertension, chronic lower extremity edema, hypothyroidism presents to the emergency room from home due to worsening lower extremity swelling and weakness.  Patient here in the emergency room was found to have heart rate in the high 20s.  Was given a dose of amiodarone.  Heart rate improved to 30s.  Through this episode patient has been awake and conversational.  Blood pressure has been with systolic around 110.  She does have history of noncompliance.  Patient is unable to tell me if she missed any doses. She also is on amiodarone and metoprolol at home.  TSH in December 2019 was 40.  PAST MEDICAL HISTORY:   Past Medical History:  Diagnosis Date  . Atrial flutter (HCC)    a. s/p TEE/DCCV 10/18 @ [facility] complicated by asystole and junctional rhythm requiring epi, atropine, and CPR x < 1 minute; b. amio and Eliquis; c. CHADS2VASC => 6 (CHF, HTN, age x 2, vascular disease, female)  . Chronic combined systolic and diastolic CHF (congestive heart failure) (HCC)   . CKD (chronic kidney disease), stage II   . COPD (chronic obstructive pulmonary disease) (HCC)   . Hypertension   . Hypothyroidism   . Iron deficiency anemia   . Morbid obesity (HCC)   . Noncompliance     PAST SURGICAL HISTORY:   Past Surgical History:  Procedure Laterality Date  . HERNIA REPAIR      SOCIAL HISTORY:   Social History   Tobacco Use  . Smoking status: Former Smoker    Types: Cigarettes  . Smokeless tobacco:  Never Used  Substance Use Topics  . Alcohol use: No    Frequency: Never    FAMILY HISTORY:   Family History  Problem Relation Age of Onset  . Diabetes Brother   . CAD Mother   . CAD Father   . Breast cancer Sister     DRUG ALLERGIES:  No Known Allergies  REVIEW OF SYSTEMS:   Review of Systems  Constitutional: Positive for malaise/fatigue. Negative for chills and fever.  HENT: Negative for sore throat.   Eyes: Negative for blurred vision, double vision and pain.  Respiratory: Positive for cough and shortness of breath. Negative for hemoptysis and wheezing.   Cardiovascular: Positive for leg swelling. Negative for chest pain, palpitations and orthopnea.  Gastrointestinal: Negative for abdominal pain, constipation, diarrhea, heartburn, nausea and vomiting.  Genitourinary: Negative for dysuria and hematuria.  Musculoskeletal: Negative for back pain and joint pain.  Skin: Negative for rash.  Neurological: Positive for dizziness. Negative for sensory change, speech change, focal weakness and headaches.  Endo/Heme/Allergies: Does not bruise/bleed easily.  Psychiatric/Behavioral: Negative for depression. The patient is not nervous/anxious.     MEDICATIONS AT HOME:   Prior to Admission medications   Medication Sig Start Date End Date Taking? Authorizing Provider  acetaminophen (TYLENOL) 325 MG tablet Take 650 mg by mouth every 6 (six) hours as needed for mild pain, moderate pain or fever.   Yes [provider]  albuterol (PROVENTIL HFA;VENTOLIN HFA) 108 (90 Base) MCG/ACT inhaler Inhale 2 puffs into the lungs every 6 (six) hours as needed for wheezing or shortness of breath.   Yes [provider]  amiodarone (PACERONE) 200 MG tablet Take 1 tablet (200 mg total) by mouth 2 (two) times daily. Take 1 tablet twice a day for 1 week and then 1 tablet once a day Patient taking differently: Take 100 mg by mouth daily.  11/11/17  Yes Enid BaasKalisetti, Radhika, MD  apixaban  (ELIQUIS) 5 MG TABS tablet Take 5 mg by mouth 2 (two) times daily. 07/16/17  Yes [provider]  atorvastatin (LIPITOR) 40 MG tablet Take 40 mg by mouth at bedtime.    Yes [provider]  Carboxymethylcellulose Sod PF (REFRESH PLUS) 0.5 % SOLN Place 1 drop into both eyes 3 (three) times daily as needed (dryness).   Yes [provider]  clotrimazole (LOTRIMIN) 1 % cream Apply 1 application topically 2 (two) times daily.   Yes [provider]  docusate sodium (COLACE) 100 MG capsule Take 200 mg by mouth daily.   Yes [provider]  ferrous sulfate 325 (65 FE) MG tablet Take 325 mg by mouth daily with breakfast.   Yes [provider]  levothyroxine (SYNTHROID, LEVOTHROID) 100 MCG tablet Take 100 mcg by mouth daily before breakfast.   Yes [provider]  lisinopril (PRINIVIL,ZESTRIL) 20 MG tablet Take 1 tablet (20 mg total) by mouth daily. 11/12/17  Yes Enid BaasKalisetti, Radhika, MD  LORazepam (ATIVAN) 0.5 MG tablet Take 0.25 mg by mouth 2 (two) times daily as needed for anxiety (agitation).   Yes [provider]  Melatonin 3 MG TABS Take 3 mg by mouth at bedtime as needed (sleep).   Yes [provider]  metoprolol tartrate (LOPRESSOR) 50 MG tablet Take 1 tablet (50 mg total) by mouth 2 (two) times daily. 09/15/18  Yes Shaune Pollackhen, Qing, MD  polyethylene glycol [facility](MIRALAX / Ethelene HalGLYCOLAX) packet Take 17 g by mouth daily as needed for mild constipation or moderate constipation.   Yes [provider]  senna-docusate (SENOKOT-S) 8.6-50 MG tablet Take 2 tablets by mouth 2 (two) times daily as needed for mild constipation or moderate constipation.   Yes [provider]  Tiotropium Bromide-Olodaterol (STIOLTO RESPIMAT) 2.5-2.5 MCG/ACT AERS Inhale 2 puffs into the lungs daily.   Yes [provider]  torsemide (DEMADEX) 20 MG tablet Take 40 mg by mouth daily.   Yes [provider]     VITAL SIGNS:  Blood pressure  104/65, pulse (!) 40, resp. rate 13, height 5' (1.524 m), weight 124.7 kg, SpO2 100 %.  PHYSICAL EXAMINATION:  Physical Exam  GENERAL:  78 y.o.-year-old patient lying in the bed.  Morbidly obese EYES: Pupils equal, round, reactive to light and accommodation. No scleral icterus. Extraocular muscles intact.  HEENT: Head atraumatic, normocephalic. Oropharynx and nasopharynx clear. No oropharyngeal erythema, moist oral mucosa  NECK:  Supple, no jugular venous distention. No thyroid enlargement, no tenderness.  LUNGS: Normal breath sounds bilaterally, no wheezing, rales, rhonchi. No use of accessory muscles of respiration.  CARDIOVASCULAR: S1, S2 .  Bradycardia ABDOMEN: Soft, nontender, nondistended. Bowel sounds present. No organomegaly or mass.  EXTREMITIES: No cyanosis, or clubbing. + 2 pedal & radial pulses b/l.  Bilateral lower extremity edema up to the knees NEUROLOGIC: Cranial nerves II through XII are intact.  Motor strength in lower extremities 4/5.  Upper extremities 5/5 PSYCHIATRIC: The patient is alert and oriented x  3.  Pleasant SKIN: Erythema in bilateral lower extremities  LABORATORY PANEL:   CBC Recent Labs  Lab 10/06/18 1411  WBC 11.6*  HGB 11.6*  HCT 41.2  PLT 263   ------------------------------------------------------------------------------------------------------------------  Chemistries  Recent Labs  Lab 10/06/18 1411  NA 141  K 4.1  CL 101  CO2 30  GLUCOSE 116*  BUN 41*  CREATININE 2.68*  CALCIUM 8.7*  AST 19  ALT 18  ALKPHOS 99  BILITOT 0.9   ------------------------------------------------------------------------------------------------------------------  Cardiac Enzymes Recent Labs  Lab 10/06/18 1411  TROPONINI 0.05*   ------------------------------------------------------------------------------------------------------------------  RADIOLOGY:  Dg Chest Portable 1 View  Result Date: 10/06/2018 CLINICAL DATA:  Shortness of breath and  bradycardia. EXAM: PORTABLE CHEST 1 VIEW COMPARISON:  09/12/2018 FINDINGS: There is a large left pleural effusion which appears increased in volume compared with the previous exam. No significant right effusion identified. Mild diffuse edema identified. IMPRESSION: 1. Significant increase in volume of left pleural effusion. 2. Pulmonary edema. Electronically Signed   By: Signa Kell M.D.   On: 10/06/2018 14:48     IMPRESSION AND PLAN:   *Bradycardia, severe.  Minimal response to atropine.  At this time patient is critically ill and will be admitted to stepdown unit.  I suspect this is likely due to noncompliance with levothyroxine with hypothyroidism along with being on amiodarone and metoprolol.  Hold amiodarone and metoprolol.  Will give stat dose of IV levothyroxine.  Atropine as needed.  Continue telemetry monitoring.  Cardiology Dr. Vennie Homans has been consulted from ER.  Will check echocardiogram.  *Hypothyroidism.  TSH in December was 40.  Starting 1 dose of IV levothyroxine and IV hydrocortisone.  Admit to stepdown unit.  Discussed with Dr. Jayme Cloud.  Repeat TSH ordered and pending.  *Hypertension.  Hold medications at this time.  *Left pleural effusion.  Seems to be chronic but slowly worsening.  Will need thoracentesis.  Will await for pulmonary input will hold Eliquis at this time.  Last dose of Eliquis was today morning according to the patient.  *Atrial fibrillation with bradycardia.  Rate control medications on hold.  Eliquis held for thoracentesis.  *Chronic diastolic congestive heart failure.  Patient does seem to be fluid overloaded but creatinine is worsening.  We will continue oral diuretics from home.  Monitor.  *CKD stage III  All the records are reviewed and case discussed with ED provider. Management plans discussed with the patient, family and they are in agreement.  CODE STATUS: FULL CODE  TOTAL CRITICAL CARE TIME TAKING CARE OF THIS PATIENT: 80 minutes.    Molinda Bailiff Lil Lepage M.D on 10/06/2018 at 3:49 PM  Between 7am to 6pm - Pager - [phone]  After 6pm go to [email or web address] - password [email or web address]  [facility]  Office  [phone]  CC: Primary care physician; [facility]  Note: This dictation was prepared with Dragon dictation along with smaller phrase technology. Any transcriptional errors that result from this process are unintentional.

## 2018-10-06 NOTE — ED Notes (Signed)
MD in room to assess pt and assist with IV placement.

## 2018-10-06 NOTE — Consult Note (Addendum)
Name: Danielle Munoz MRN: [id number] DOB: 08-27-1941    ADMISSION DATE:  10/06/2018 CONSULTATION DATE: 10/06/2018  REFERRING MD : Dr. Elpidio Anis   CHIEF COMPLAINT: Low Heart Rate   BRIEF PATIENT DESCRIPTION:  78 yo female admitted with symptomatic bradycardia likely secondary to antiarrhythmic medications vs. uncontrolled hypothyroidism due to medication noncompliance   SIGNIFICANT EVENTS  01/6-Pt admitted to the stepdown unit   HISTORY OF PRESENT ILLNESS:   This is a 78 yo female with a PMH of Morbid Obesity, Bradycardia (08/2018 hr 40's), Iron Deficiency Anemia, Hypothyroidism, HTN, COPD, CKD Stage II, Chronic Combined Systolic and Diastolic CHF, Chronic Lower Extremity Edema, and Atrial Fibrillation/Flutter.  She presented to [facility] ER on 01/6 via EMS from [facility] with c/o nausea, low heart rate, and bilateral lower extremity swelling.  Per ER notes EMS reported upon their arrival pt c/o dizziness and nausea that started the morning of 01/6. In the ER pt bradycardic heart rate 24 with c/o dizziness, chest pain, and nausea.  She received 0.5 mg iv atropine and cardiology contacted by ER physician.  She does take amiodarone and metoprolol for atrial fibrillation.  She does have a hx of medication noncompliance and is uncertain if she has had missed doses of any of her medications. Despite iv atropine she remained bradycardic with hr in the mid to upper 30's, however bp stable.  Lab results revealed BUN 41, creatinine 2.68, BNP 1,056, troponin 0.05, wbc 11.6, hgb 11.6, TSH 22,701, and CXR showed pulmonary edema and large left pleural effusion.  She did receive 50 mcg iv synthroid and 50 mg iv solu-cortef.  She was subsequently admitted to the stepdown unit by hospitalist team for additional workup and treatment.   PAST MEDICAL HISTORY :   has a past medical history of Atrial flutter (HCC), Chronic combined systolic and diastolic CHF (congestive heart failure) (HCC), CKD (chronic kidney disease),  stage II, COPD (chronic obstructive pulmonary disease) (HCC), Hypertension, Hypothyroidism, Iron deficiency anemia, Morbid obesity (HCC), and Noncompliance.  has a past surgical history that includes Hernia repair. Prior to Admission medications   Medication Sig Start Date End Date Taking? Authorizing Provider  acetaminophen (TYLENOL) 325 MG tablet Take 650 mg by mouth every 6 (six) hours as needed for mild pain, moderate pain or fever.   Yes [provider]  albuterol (PROVENTIL HFA;VENTOLIN HFA) 108 (90 Base) MCG/ACT inhaler Inhale 2 puffs into the lungs every 6 (six) hours as needed for wheezing or shortness of breath.   Yes [provider]  amiodarone (PACERONE) 200 MG tablet Take 1 tablet (200 mg total) by mouth 2 (two) times daily. Take 1 tablet twice a day for 1 week and then 1 tablet once a day Patient taking differently: Take 100 mg by mouth daily.  11/11/17  Yes Enid Baas, MD  apixaban (ELIQUIS) 5 MG TABS tablet Take 5 mg by mouth 2 (two) times daily. 07/16/17  Yes [provider]  atorvastatin (LIPITOR) 40 MG tablet Take 40 mg by mouth at bedtime.    Yes [provider]  Carboxymethylcellulose Sod PF (REFRESH PLUS) 0.5 % SOLN Place 1 drop into both eyes 3 (three) times daily as needed (dryness).   Yes [provider]  clotrimazole (LOTRIMIN) 1 % cream Apply 1 application topically 2 (two) times daily.   Yes [provider]  docusate sodium (COLACE) 100 MG capsule Take 200 mg by mouth daily.   Yes [provider]  ferrous sulfate 325 (65 FE) MG tablet Take  325 mg by mouth daily with breakfast.   Yes [provider]  levothyroxine (SYNTHROID, LEVOTHROID) 100 MCG tablet Take 100 mcg by mouth daily before breakfast.   Yes [provider]  lisinopril (PRINIVIL,ZESTRIL) 20 MG tablet Take 1 tablet (20 mg total) by mouth daily. 11/12/17  Yes Enid BaasKalisetti, Radhika, MD  LORazepam (ATIVAN) 0.5 MG tablet Take 0.25 mg  by mouth 2 (two) times daily as needed for anxiety (agitation).   Yes [provider]  Melatonin 3 MG TABS Take 3 mg by mouth at bedtime as needed (sleep).   Yes [provider]  metoprolol tartrate (LOPRESSOR) 50 MG tablet Take 1 tablet (50 mg total) by mouth 2 (two) times daily. 09/15/18  Yes Shaune Pollackhen, Qing, MD  polyethylene glycol [facility](MIRALAX / Ethelene HalGLYCOLAX) packet Take 17 g by mouth daily as needed for mild constipation or moderate constipation.   Yes [provider]  senna-docusate (SENOKOT-S) 8.6-50 MG tablet Take 2 tablets by mouth 2 (two) times daily as needed for mild constipation or moderate constipation.   Yes [provider]  Tiotropium Bromide-Olodaterol (STIOLTO RESPIMAT) 2.5-2.5 MCG/ACT AERS Inhale 2 puffs into the lungs daily.   Yes [provider]  torsemide (DEMADEX) 20 MG tablet Take 40 mg by mouth daily.   Yes [provider]   No Known Allergies  FAMILY HISTORY:  family history includes Breast cancer in her sister; CAD in her father and mother; Diabetes in her brother. SOCIAL HISTORY:  reports that she has quit smoking. Her smoking use included cigarettes. She has never used smokeless tobacco. She reports that she does not drink alcohol or use drugs.  REVIEW OF SYSTEMS: Positives in BOLD  Constitutional: Negative for fever, chills, weight loss, malaise/fatigue and diaphoresis.  HENT: Negative for hearing loss, ear pain, nosebleeds, congestion, sore throat, neck pain, tinnitus and ear discharge.   Eyes: Negative for blurred vision, double vision, photophobia, pain, discharge and redness.  Respiratory: cough, hemoptysis, sputum production, shortness of breath, wheezing and stridor.   Cardiovascular: low heart rate, chest pain, palpitations, orthopnea, claudication, leg swelling and PND.  Gastrointestinal: Negative for heartburn, nausea, vomiting, abdominal pain, diarrhea, constipation, blood in stool and melena.  Genitourinary:  Negative for dysuria, urgency, frequency, hematuria and flank pain.  Musculoskeletal: Negative for myalgias, back pain, joint pain and falls.  Skin: Negative for itching and rash.  Neurological: dizziness, tingling, tremors, sensory change, speech change, focal weakness, seizures, loss of consciousness, weakness and headaches.  Endo/Heme/Allergies: Negative for environmental allergies and polydipsia. Does not bruise/bleed easily.  SUBJECTIVE:  No complaints at this time  VITAL SIGNS: Pulse Rate:  [29-60] 29 (01/06 2000) Resp:  [8-24] 24 (01/06 2000) BP: (85-147)/(53-123) 127/71 (01/06 2000) SpO2:  [88 %-100 %] 100 % (01/06 2000) Weight:  [124.7 kg] 124.7 kg (01/06 1354)  PHYSICAL EXAMINATION: General: well developed, well nourished female, NAD resting in bed  Neuro: alert and oriented, follows commands  HEENT: supple, no JVD  Cardiovascular: sinus bradycardia, no R/G Lungs: faint crackles throughout, diminished LLL, even, non labored Abdomen: +BS x4, soft, non tender, non distended  Musculoskeletal: moves all extremities, 2+ bilateral lower extremity edema  Skin: scabbed over abrasion abdominal LUQ, left foot dorsal abrasion, right shin abrasion   Recent Labs  Lab 10/06/18 1411  NA 141  K 4.1  CL 101  CO2 30  BUN 41*  CREATININE 2.68*  GLUCOSE 116*   Recent Labs  Lab 10/06/18 1411  HGB 11.6*  HCT 41.2  WBC 11.6*  PLT  263   Dg Chest Portable 1 View  Result Date: 10/06/2018 CLINICAL DATA:  Shortness of breath and bradycardia. EXAM: PORTABLE CHEST 1 VIEW COMPARISON:  09/12/2018 FINDINGS: There is a large left pleural effusion which appears increased in volume compared with the previous exam. No significant right effusion identified. Mild diffuse edema identified. IMPRESSION: 1. Significant increase in volume of left pleural effusion. 2. Pulmonary edema. Electronically Signed   By: Signa Kell M.D.   On: 10/06/2018 14:48    ASSESSMENT / PLAN:  Symptomatic bradycardia  secondary antiarrhythmic medications vs. uncontrolled hypothyroidism due to medication noncompliance  Mildly elevated troponin likely demand ischemia  Hx: HTN, Atrial Flutter, and Atrial Fibrillation Continuous telemetry monitoring  Trend troponin's  Hold outpatient amiodarone and metoprolol Prn atropine for heart rate <30  Echo pending Cardiology consulted appreciate input  Continue synthroid   Acute on chronic hypoxic respiratory failure secondary to left pleural effusion and combined systolic and diastolic CHF exacerbation  Hx: COPD and Chronic Home O2 @2L  Supplemental O2 for dyspnea and/or hypoxia  Prn bronchodilator therapy  Hold outpatient eliquis for now she will likely need left sided thoracentesis   Acute on chronic renal failure  Trend BMP  Replace electrolytes as indicated  Monitor UOP  Hold outpatient lasix for now   Iron deficiency anemia  Trend CBC  Monitor for s/sx of bleeding and hgb <7  Sonda Rumble, AGNP  Pulmonary/Critical Care Pager [phone] (please enter 7 digits) PCCM Consult Pager [phone] (please enter 7 digits)

## 2018-10-06 NOTE — ED Notes (Signed)
Pt is resting and is calm.  Offered to reposition pt, she declines

## 2018-10-07 ENCOUNTER — Inpatient Hospital Stay: Payer: Medicare Other

## 2018-10-07 ENCOUNTER — Inpatient Hospital Stay
Admit: 2018-10-07 | Discharge: 2018-10-07 | Disposition: A | Discharge status: Home / Self Care | Payer: Medicare Other | Attending: Internal Medicine | Admitting: Internal Medicine

## 2018-10-07 DIAGNOSIS — R001 Bradycardia, unspecified: Principal | ICD-10-CM

## 2018-10-07 DIAGNOSIS — E038 Other specified hypothyroidism: Secondary | ICD-10-CM

## 2018-10-07 LAB — BASIC METABOLIC PANEL
Anion gap: 8 (ref 5–15)
BUN: 45 mg/dL — ABNORMAL HIGH (ref 8–23)
CO2: 31 mmol/L (ref 22–32)
Calcium: 8.4 mg/dL — ABNORMAL LOW (ref 8.9–10.3)
Chloride: 102 mmol/L (ref 98–111)
Creatinine, Ser: 3.05 mg/dL — ABNORMAL HIGH (ref 0.44–1.00)
GFR calc Af Amer: 16 mL/min — ABNORMAL LOW (ref >60)
GFR calc non Af Amer: 14 mL/min — ABNORMAL LOW (ref >60)
Glucose, Bld: 96 mg/dL (ref 70–99)
Potassium: 4.1 mmol/L (ref 3.5–5.1)
Sodium: 141 mmol/L (ref 135–145)

## 2018-10-07 LAB — ECHOCARDIOGRAM COMPLETE
Height: 60 in
Weight: 4127.01 oz

## 2018-10-07 LAB — CBC
HCT: 40.3 % (ref 36.0–46.0)
Hemoglobin: 11.1 g/dL — ABNORMAL LOW (ref 12.0–15.0)
MCH: 21.4 pg — ABNORMAL LOW (ref 26.0–34.0)
MCHC: 27.5 g/dL — AB (ref 30.0–36.0)
MCV: 77.6 fL — ABNORMAL LOW (ref 80.0–100.0)
Platelets: 217 10*3/uL (ref 150–400)
RBC: 5.19 MIL/uL — ABNORMAL HIGH (ref 3.87–5.11)
RDW: 22.8 % — ABNORMAL HIGH (ref 11.5–15.5)
WBC: 12.5 10*3/uL — ABNORMAL HIGH (ref 4.0–10.5)
nRBC: 0 % (ref 0.0–0.2)

## 2018-10-07 LAB — TROPONIN I
Troponin I: 0.05 ng/mL (ref <0.03)
Troponin I: 0.04 ng/mL (ref <0.03)

## 2018-10-07 LAB — MRSA PCR SCREENING: MRSA by PCR: NEGATIVE

## 2018-10-07 LAB — GLUCOSE, CAPILLARY: Glucose-Capillary: 110 mg/dL — ABNORMAL HIGH (ref 70–99)

## 2018-10-07 MED ORDER — LORAZEPAM 0.5 MG PO TABS: 0.2500 mg | ORAL_TABLET | Freq: Two times a day (BID) | ORAL | Status: DC | PRN

## 2018-10-07 MED ORDER — MELATONIN 5 MG PO TABS
2.5000 mg | ORAL_TABLET | Freq: Every evening | ORAL | Status: DC | PRN
  Administered 2018-10-08: 2.5 mg via ORAL
  Filled 2018-10-07 (×2): qty 0.5

## 2018-10-07 MED ORDER — ARFORMOTEROL TARTRATE 15 MCG/2ML IN NEBU
15.0000 ug | INHALATION_SOLUTION | Freq: Two times a day (BID) | RESPIRATORY_TRACT | Status: DC
  Administered 2018-10-07 – 2018-10-10 (×5): 15 ug via RESPIRATORY_TRACT
  Filled 2018-10-07 (×8): qty 2

## 2018-10-07 MED ORDER — DOPAMINE-DEXTROSE 3.2-5 MG/ML-% IV SOLN
0.0000 ug/kg/min | INTRAVENOUS | Status: DC
  Administered 2018-10-07: 5 ug/kg/min via INTRAVENOUS
  Administered 2018-10-08: 2.5 ug/kg/min via INTRAVENOUS
  Filled 2018-10-07 (×2): qty 250

## 2018-10-07 MED ORDER — MELATONIN 3 MG PO TABS: 3.0000 mg | ORAL_TABLET | Freq: Every evening | ORAL | Status: DC | PRN

## 2018-10-07 MED ORDER — IPRATROPIUM BROMIDE 0.02 % IN SOLN
0.5000 mg | Freq: Two times a day (BID) | RESPIRATORY_TRACT | Status: DC
  Administered 2018-10-07 – 2018-10-10 (×6): 0.5 mg via RESPIRATORY_TRACT
  Filled 2018-10-07 (×7): qty 2.5

## 2018-10-07 MED ORDER — LEVOTHYROXINE SODIUM 100 MCG/5ML IV SOLN
50.0000 ug | Freq: Every day | INTRAVENOUS | Status: DC
  Administered 2018-10-07: 50 ug via INTRAVENOUS
  Filled 2018-10-07 (×2): qty 5

## 2018-10-07 MED ORDER — LEVOTHYROXINE SODIUM 100 MCG PO TABS: 100.0000 ug | ORAL_TABLET | Freq: Every day | ORAL | Status: DC

## 2018-10-07 MED ORDER — TORSEMIDE 20 MG PO TABS
40.0000 mg | ORAL_TABLET | Freq: Every day | ORAL | Status: DC
  Administered 2018-10-08 – 2018-10-10 (×2): 40 mg via ORAL
  Filled 2018-10-07 (×2): qty 2

## 2018-10-07 MED ORDER — ATORVASTATIN CALCIUM 20 MG PO TABS
40.0000 mg | ORAL_TABLET | Freq: Every day | ORAL | Status: DC
  Administered 2018-10-07 – 2018-10-09 (×3): 40 mg via ORAL
  Filled 2018-10-07 (×3): qty 2

## 2018-10-07 NOTE — Progress Notes (Signed)
Pts HR sustaining 20's to 30's, pt lying in bed sleeping but is arousable. Cardiology on call paged and pt given atropine x 1. Pt HR up to 50's and BP wnl. Cardiology MD at bedside, will continue to monitor closely and carry out new orders.

## 2018-10-07 NOTE — Progress Notes (Signed)
[name], female   DOB: 1941-09-29, 78 y.o.   MRN: [id number]  [employer] PROGRESS NOTE  MARYJO GIRARDI IRJ:[id number] DOB: 08/14/1941 DOA: 10/06/2018 PCP: Clinic, [facility]  HPI/Subjective: When I saw the patient the patient's heart rate was in the 50s.  Apparently the patient's heart rate went down into the 20s and 30s.  Patient was started on dopamine.  Objective: Vitals:   10/07/18 1500 10/07/18 1600  BP: (!) 154/98   Pulse: 70 67  Resp: 16 19  Temp:    SpO2:      Filed Weights   10/06/18 1354 10/07/18 0057  Weight: 124.7 kg 117 kg    ROS: Review of Systems  Constitutional: Negative for chills and fever.  Eyes: Negative for blurred vision.  Respiratory: Positive for shortness of breath. Negative for cough.   Cardiovascular: Negative for chest pain.  Gastrointestinal: Negative for abdominal pain, constipation, diarrhea, nausea and vomiting.  Genitourinary: Negative for dysuria.  Musculoskeletal: Negative for joint pain.  Neurological: Negative for dizziness and headaches.   Exam: Physical Exam  Constitutional: She is oriented to person, place, and time.  HENT:  Nose: No mucosal edema.  Mouth/Throat: No oropharyngeal exudate or posterior oropharyngeal edema.  Eyes: Pupils are equal, round, and reactive to light. Conjunctivae, EOM and lids are normal.  Neck: No JVD present. Carotid bruit is not present. No edema present. No thyroid mass and no thyromegaly present.  Cardiovascular: S1 normal and S2 normal. Exam reveals no gallop.  No murmur heard. Pulses:      Dorsalis pedis pulses are 2+ on the right side and 2+ on the left side.  Respiratory: No respiratory distress. She has decreased breath sounds in the right lower field and the left lower field. She has no wheezes. She has no rhonchi. She has rales in the right lower field and the left lower field.  GI: Soft. Bowel sounds are normal. There is no abdominal tenderness.  Musculoskeletal:      Right ankle: She exhibits swelling.     Left ankle: She exhibits swelling.  Lymphadenopathy:    She has no cervical adenopathy.  Neurological: She is alert and oriented to person, place, and time. No cranial nerve deficit.  Skin: Skin is warm. Nails show no clubbing.  Chronic lower extremity lymphedema bilaterally with chronic lower extremity discoloration  Psychiatric: She has a normal mood and affect.      Data Reviewed: Basic Metabolic Panel: Recent Labs  Lab 10/06/18 1411 10/07/18 0645  NA 141 141  K 4.1 4.1  CL 101 102  CO2 30 31  GLUCOSE 116* 96  BUN 41* 45*  CREATININE 2.68* 3.05*  CALCIUM 8.7* 8.4*   Liver Function Tests: Recent Labs  Lab 10/06/18 1411  AST 19  ALT 18  ALKPHOS 99  BILITOT 0.9  PROT 6.3*  ALBUMIN 3.2*   CBC: Recent Labs  Lab 10/06/18 1411 10/07/18 0645  WBC 11.6* 12.5*  HGB 11.6* 11.1*  HCT 41.2 40.3  MCV 76.3* 77.6*  PLT 263 217   Cardiac Enzymes: Recent Labs  Lab 10/06/18 1411 10/07/18 0110 10/07/18 0645  TROPONINI 0.05* 0.05* 0.04*   BNP (last 3 results) Recent Labs    11/07/17 2119 10/06/18 1411  BNP 1,362.0* 1,056.0*     CBG: Recent Labs  Lab 10/07/18 0047  GLUCAP 110*    Recent Results (from the past 240 hour(s))  MRSA PCR Screening     Status: None   Collection Time: 10/07/18  12:56 AM  Result Value Ref Range Status   MRSA by PCR NEGATIVE NEGATIVE Final    Comment:        The GeneXpert MRSA Assay (FDA approved for NASAL specimens only), is one component of a comprehensive MRSA colonization surveillance program. It is not intended to diagnose MRSA infection nor to guide or monitor treatment for MRSA infections. Performed at [facility], [address] [place], [place] [address]      Studies: [place] Chest (pleural Effusion)  Result Date: 10/07/2018 CLINICAL DATA:  Acute on chronic respiratory failure. Evaluate for pleural fluid and thoracentesis. EXAM: CHEST ULTRASOUND COMPARISON:   Chest radiograph 10/06/2018 FINDINGS: Both sides of the chest were evaluated with ultrasound. A small amount of subpulmonic pleural fluid seen on both sides. Fluid was not amendable for thoracentesis due to overlying lung. IMPRESSION: Small amount of pleural fluid bilaterally. Fluid is not amendable for thoracentesis. Electronically Signed   By: Richarda OverlieAdam  Henn M.D.   On: 10/07/2018 10:58   Dg Chest Portable 1 View  Result Date: 10/06/2018 CLINICAL DATA:  Shortness of breath and bradycardia. EXAM: PORTABLE CHEST 1 VIEW COMPARISON:  09/12/2018 FINDINGS: There is a large left pleural effusion which appears increased in volume compared with the previous exam. No significant right effusion identified. Mild diffuse edema identified. IMPRESSION: 1. Significant increase in volume of left pleural effusion. 2. Pulmonary edema. Electronically Signed   By: Signa Kellaylor  Stroud M.D.   On: 10/06/2018 14:48    Scheduled Meds: . arformoterol  15 mcg Nebulization BID  . atorvastatin  40 mg Oral QHS  . docusate sodium  100 mg Oral BID  . ipratropium  0.5 mg Nebulization Q12H  . levothyroxine  50 mcg Intravenous Daily  . sodium chloride flush  3 mL Intravenous Q12H  . torsemide  40 mg Oral Daily   Continuous Infusions: . DOPamine 2.5 mcg/kg/min (10/07/18 1432)    Assessment/Plan:  1. Severe bradycardia.  Patient started on dopamine drip.  Awaiting amiodarone and metoprolol to get out of the system.  Cardiology following.  May end up needing a pacemaker. 2. Acute kidney injury with acute diastolic congestive heart failure.  The acute kidney injury is likely from poor perfusion with severe bradycardia.  Watch creatinine closely on torsemide. 3. History of atrial fibrillation.  Holding rate controlling medications and Eliquis at this time 4. Hyperlipidemia unspecified on atorvastatin 5. Hypothyroidism unspecified on levothyroxine 6. Patient states she only wears oxygen at night  Code Status:     Code Status Orders   (From admission, onward)         Start     Ordered   10/06/18 1544  Full code  Continuous     10/06/18 1547        Code Status History    Date Active Date Inactive Code Status Order ID Comments User Context   09/12/2018 0556 09/15/2018 2306 Full Code [id number]  Arnaldo Nataliamond, Michael S, MD Inpatient   11/08/2017 0102 11/11/2017 2121 Full Code [id number]  Oralia ManisWillis, David, MD ED     Disposition Plan: To be determined  Consultants:  Critical care team  Cardiology  Time spent: 27 minutes  Angelle Isais [employer]  [employer]

## 2018-10-07 NOTE — Progress Notes (Signed)
eLink Physician-Brief Progress Note Patient Name: Danielle Munoz DOB: [birth year] MRN: [id number]   Date of Service  10/07/2018  HPI/Events of Note  78 yo female admitted with symptomatic bradycardia likely secondary to antiarrhythmic medications vs. uncontrolled hypothyroidism due to medication noncompliance. VSS.  eICU Interventions  No new orders.     Intervention Category Evaluation Type: New Patient Evaluation  Lenell Antu 10/07/2018, 12:53 AM

## 2018-10-07 NOTE — Consult Note (Signed)
Cardiology Consultation Note    Patient ID: Danielle MoatsMarian L Streetman, MRN: [id number], DOB/AGE: 1940-11-03 78 y.o. Admit date: 10/06/2018   Date of Consult: 10/07/2018 Primary Physician: [facility]  Chief Complaint: legs swwelling Reason for Consultation: bradycardia Requesting MD: ICU Team  HPI: Danielle MoatsMarian L Ke is a 78 y.o. female with history of atrial flutter currently treated with amiodarone and Eliquis with a history of asystole and junctional rhythm during a TEE in the past, history of combined systolic and diastolic congestive heart failure, history of stage II chronic renal disease admitted with bradycardia and acute on chronic renal insufficiency.  Patient presented to the emergency room complaining of worsening peripheral edema.  She denied syncope or presyncope.  She has a history of COPD, hypertension and obesity.  Patient is a very difficult historian.  Is somewhat combative both verbally and somewhat physically during the exam.  Denies syncope.  States that she was talked to about a pacemaker in the past but this was deferred.  She has had a history of noncompliance with medications.  Past Medical History:  Diagnosis Date  . Atrial flutter (HCC)    a. s/p TEE/DCCV 10/18 @ [facility] complicated by asystole and junctional rhythm requiring epi, atropine, and CPR x < 1 minute; b. amio and Eliquis; c. CHADS2VASC => 6 (CHF, HTN, age x 2, vascular disease, female)  . Chronic combined systolic and diastolic CHF (congestive heart failure) (HCC)   . CKD (chronic kidney disease), stage II   . COPD (chronic obstructive pulmonary disease) (HCC)   . Hypertension   . Hypothyroidism   . Iron deficiency anemia   . Morbid obesity (HCC)   . Noncompliance       Surgical History:  Past Surgical History:  Procedure Laterality Date  . HERNIA REPAIR       Home Meds: Prior to Admission medications   Medication Sig Start Date End Date Taking? Authorizing Provider   acetaminophen (TYLENOL) 325 MG tablet Take 650 mg by mouth every 6 (six) hours as needed for mild pain, moderate pain or fever.   Yes [provider]  albuterol (PROVENTIL HFA;VENTOLIN HFA) 108 (90 Base) MCG/ACT inhaler Inhale 2 puffs into the lungs every 6 (six) hours as needed for wheezing or shortness of breath.   Yes [provider]  amiodarone (PACERONE) 200 MG tablet Take 1 tablet (200 mg total) by mouth 2 (two) times daily. Take 1 tablet twice a day for 1 week and then 1 tablet once a day Patient taking differently: Take 100 mg by mouth daily.  11/11/17  Yes Enid BaasKalisetti, Radhika, MD  apixaban (ELIQUIS) 5 MG TABS tablet Take 5 mg by mouth 2 (two) times daily. 07/16/17  Yes [provider]  atorvastatin (LIPITOR) 40 MG tablet Take 40 mg by mouth at bedtime.    Yes [provider]  Carboxymethylcellulose Sod PF (REFRESH PLUS) 0.5 % SOLN Place 1 drop into both eyes 3 (three) times daily as needed (dryness).   Yes [provider]  clotrimazole (LOTRIMIN) 1 % cream Apply 1 application topically 2 (two) times daily.   Yes [provider]  docusate sodium (COLACE) 100 MG capsule Take 200 mg by mouth daily.   Yes [provider]  ferrous sulfate 325 (65 FE) MG tablet Take 325 mg by mouth daily with breakfast.   Yes [provider]  levothyroxine (SYNTHROID, LEVOTHROID) 100 MCG tablet Take 100 mcg by mouth daily before breakfast.   Yes [provider]  lisinopril (PRINIVIL,ZESTRIL) 20 MG tablet Take 1 tablet (20 mg total) by mouth daily. 11/12/17  Yes Enid Baas, MD  LORazepam (ATIVAN) 0.5 MG tablet Take 0.25 mg by mouth 2 (two) times daily as needed for anxiety (agitation).   Yes [provider]  Melatonin 3 MG TABS Take 3 mg by mouth at bedtime as needed (sleep).   Yes [provider]  metoprolol tartrate (LOPRESSOR) 50 MG tablet Take 1 tablet (50 mg total) by mouth 2 (two) times daily. 09/15/18  Yes  Shaune Pollack, MD  polyethylene glycol [facility] Ethelene Hal) packet Take 17 g by mouth daily as needed for mild constipation or moderate constipation.   Yes [provider]  senna-docusate (SENOKOT-S) 8.6-50 MG tablet Take 2 tablets by mouth 2 (two) times daily as needed for mild constipation or moderate constipation.   Yes [provider]  Tiotropium Bromide-Olodaterol (STIOLTO RESPIMAT) 2.5-2.5 MCG/ACT AERS Inhale 2 puffs into the lungs daily.   Yes [provider]  torsemide (DEMADEX) 20 MG tablet Take 40 mg by mouth daily.   Yes [provider]    Inpatient Medications:  . arformoterol  15 mcg Nebulization BID  . atorvastatin  40 mg Oral QHS  . docusate sodium  100 mg Oral BID  . ipratropium  0.5 mg Nebulization Q12H  . levothyroxine  50 mcg Intravenous Daily  . sodium chloride flush  3 mL Intravenous Q12H  . torsemide  40 mg Oral Daily     Allergies: No Known Allergies  Social History   Socioeconomic History  . Marital status: Single    Spouse name: Not on file  . Number of children: Not on file  . Years of education: Not on file  . Highest education level: Not on file  Occupational History  . Not on file  Social Needs  . Financial resource strain: Not on file  . Food insecurity:    Worry: Not on file    Inability: Not on file  . Transportation needs:    Medical: Not on file    Non-medical: Not on file  Tobacco Use  . Smoking status: Former Smoker    Types: Cigarettes  . Smokeless tobacco: Never Used  Substance and Sexual Activity  . Alcohol use: No    Frequency: Never  . Drug use: No  . Sexual activity: Not on file  Lifestyle  . Physical activity:    Days per week: Not on file    Minutes per session: Not on file  . Stress: Not on file  Relationships  . Social connections:    Talks on phone: Not on file    Gets together: Not on file    Attends religious service: Not on file    Active member of club or organization: Not on  file    Attends meetings of clubs or organizations: Not on file    Relationship status: Not on file  . Intimate partner violence:    Fear of current or ex partner: Not on file    Emotionally abused: Not on file    Physically abused: Not on file    Forced sexual activity: Not on file  Other Topics Concern  . Not on file  Social History Narrative  . Not on file     Family History  Problem Relation Age of Onset  . Diabetes Brother   . CAD Mother   . CAD Father   . Breast cancer Sister  Review of Systems: A 12-system review of systems was performed and is negative except as noted in the HPI.  Labs: Recent Labs    10/06/18 1411 10/07/18 0110 10/07/18 0645  TROPONINI 0.05* 0.05* 0.04*   Lab Results  Component Value Date   WBC 12.5 (H) 10/07/2018   HGB 11.1 (L) 10/07/2018   HCT 40.3 10/07/2018   MCV 77.6 (L) 10/07/2018   PLT 217 10/07/2018    Recent Labs  Lab 10/06/18 1411 10/07/18 0645  NA 141 141  K 4.1 4.1  CL 101 102  CO2 30 31  BUN 41* 45*  CREATININE 2.68* 3.05*  CALCIUM 8.7* 8.4*  PROT 6.3*  --   BILITOT 0.9  --   ALKPHOS 99  --   ALT 18  --   AST 19  --   GLUCOSE 116* 96   No results found for: CHOL, HDL, LDLCALC, TRIG No results found for: DDIMER  Radiology/Studies:  Dg Chest 2 View  Result Date: 09/12/2018 CLINICAL DATA:  78 year old female with shortness of breath. EXAM: CHEST - 2 VIEW COMPARISON:  Chest radiograph dated 11/08/2017 FINDINGS: There is cardiomegaly with vascular congestion. Diffuse interstitial coarsening and bronchitic changes. Left lung base density may represent combination of a small pleural effusion or atelectasis although infiltrate is not excluded. Clinical correlation is recommended. No pneumothorax. Atherosclerotic calcification of the aortic arch. Osteopenia with degenerative changes of the spine. No acute osseous pathology. IMPRESSION: 1. Cardiomegaly with vascular congestion . 2. Left lung base atelectasis versus  infiltrate. Possible small left pleural effusion. Electronically Signed   By: Elgie CollardArash  Radparvar M.D.   On: 09/12/2018 03:57   [place] Chest (pleural Effusion)  Result Date: 10/07/2018 CLINICAL DATA:  Acute on chronic respiratory failure. Evaluate for pleural fluid and thoracentesis. EXAM: CHEST ULTRASOUND COMPARISON:  Chest radiograph 10/06/2018 FINDINGS: Both sides of the chest were evaluated with ultrasound. A small amount of subpulmonic pleural fluid seen on both sides. Fluid was not amendable for thoracentesis due to overlying lung. IMPRESSION: Small amount of pleural fluid bilaterally. Fluid is not amendable for thoracentesis. Electronically Signed   By: Richarda OverlieAdam  Henn M.D.   On: 10/07/2018 10:58   Dg Chest Portable 1 View  Result Date: 10/06/2018 CLINICAL DATA:  Shortness of breath and bradycardia. EXAM: PORTABLE CHEST 1 VIEW COMPARISON:  09/12/2018 FINDINGS: There is a large left pleural effusion which appears increased in volume compared with the previous exam. No significant right effusion identified. Mild diffuse edema identified. IMPRESSION: 1. Significant increase in volume of left pleural effusion. 2. Pulmonary edema. Electronically Signed   By: Signa Kellaylor  Stroud M.D.   On: 10/06/2018 14:48    Wt Readings from Last 3 Encounters:  10/07/18 117 kg  09/14/18 120 kg  11/11/17 116.5 kg    EKG: Atrial fibrillation with slow ventricular response  Physical Exam:  Blood pressure (!) 107/46, pulse 61, temperature 98.3 F (36.8 C), temperature source Oral, resp. rate 18, height 5' (1.524 m), weight 117 kg, SpO2 97 %. Body mass index is 50.38 kg/m. General: Well developed, well nourished, in no acute distress. Head: Normocephalic, atraumatic, sclera non-icteric, no xanthomas, nares are without discharge.  Neck: Negative for carotid bruits. JVD not elevated. Lungs: Clear bilaterally to auscultation without wheezes, rales, or rhonchi. Breathing is unlabored. Heart: Irregular regular rhythm somewhat  bradycardic. Abdomen: Soft, non-tender, non-distended with normoactive bowel sounds. No hepatomegaly. No rebound/guarding. No obvious abdominal masses. Msk:  Strength and tone appear normal for age. Extremities: 4+ edema in lower extremities.  Neuro: Alert and oriented X 3. No facial asymmetry. No focal deficit. Moves all extremities spontaneously. Psych:  Responds to questions appropriately however is somewhat agitated in her response.     Assessment and Plan  78 year old female with history of heart failure with preserved ejection fraction and COPD admitted with worsening peripheral edema, weight gain and acute on chronic renal insufficiency.  She had been on amiodarone as well as Eliquis.  She has had had a history of bradycardia with a transesophageal echo and cardioversion in the past.  She was noted to be significantly bradycardic with heart rates in the 20s to 30s.  Responding to atropine.  Currently during my exam had a heart rate after atropine in the upper 40s to lower 50s.  Blood pressure is 90-99 systolic.  Patient has been taken off of amiodarone and Eliquis.  Her creatinine today was 3.05 up from a baseline of 1.073 weeks ago.  Serum potassium is normal.  She is ruled out for myocardial infarction although has mildly elevated serum troponin this is likely secondary to her renal insufficiency and demand  Bradycardia likely secondary to acute on chronic renal insufficiency and amiodarone use.  Will discontinue all AV nodal drugs and follow.  Due to relative bradycardia and hypotension will add dopamine and titrate to heart rate and blood pressure.  After washout from her amiodarone, consider permanent pacemaker after Eliquis is also eliminated.  Does not appear to require it temporary pacemaker at present.  Would be somewhat high risk at present due to her confusion as well as chronic anticoagulation with Eliquis in the face of a GFR of 14.  Renal insufficiency-etiology unclear.  Will likely  need nephrology input.  Signed, Dalia Heading MD 10/07/2018, 12:39 PM Pager: [phone]

## 2018-10-07 NOTE — Progress Notes (Signed)
Pt had a 3.9 second pause while in ultrasound having thoracentesis. MD made aware.

## 2018-10-07 NOTE — Progress Notes (Signed)
*  PRELIMINARY RESULTS* Echocardiogram 2D Echocardiogram has been performed.  Joanette Gula Telina Kleckley 10/07/2018, 2:00 PM

## 2018-10-08 ENCOUNTER — Inpatient Hospital Stay: Payer: Medicare Other

## 2018-10-08 ENCOUNTER — Encounter: Payer: Self-pay | Admitting: Anesthesiology

## 2018-10-08 LAB — CBC
HCT: 41.9 % (ref 36.0–46.0)
Hemoglobin: 11.7 g/dL — ABNORMAL LOW (ref 12.0–15.0)
MCH: 21.3 pg — ABNORMAL LOW (ref 26.0–34.0)
MCHC: 27.9 g/dL — AB (ref 30.0–36.0)
MCV: 76.2 fL — ABNORMAL LOW (ref 80.0–100.0)
Platelets: 237 10*3/uL (ref 150–400)
RBC: 5.5 MIL/uL — ABNORMAL HIGH (ref 3.87–5.11)
RDW: 22.6 % — ABNORMAL HIGH (ref 11.5–15.5)
WBC: 11.3 10*3/uL — AB (ref 4.0–10.5)
nRBC: 0 % (ref 0.0–0.2)

## 2018-10-08 LAB — BASIC METABOLIC PANEL
Anion gap: 9 (ref 5–15)
BUN: 48 mg/dL — AB (ref 8–23)
CO2: 32 mmol/L (ref 22–32)
CREATININE: 2.9 mg/dL — AB (ref 0.44–1.00)
Calcium: 8.4 mg/dL — ABNORMAL LOW (ref 8.9–10.3)
Chloride: 101 mmol/L (ref 98–111)
GFR calc Af Amer: 17 mL/min — ABNORMAL LOW (ref >60)
GFR calc non Af Amer: 15 mL/min — ABNORMAL LOW (ref >60)
Glucose, Bld: 89 mg/dL (ref 70–99)
Potassium: 3.9 mmol/L (ref 3.5–5.1)
Sodium: 142 mmol/L (ref 135–145)

## 2018-10-08 MED ORDER — CHLORHEXIDINE GLUCONATE 4 % EX LIQD: 60.0000 mL | Freq: Once | CUTANEOUS | Status: DC

## 2018-10-08 MED ORDER — SODIUM CHLORIDE 0.9 % IV SOLN: 250.0000 mL | INTRAVENOUS | Status: DC

## 2018-10-08 MED ORDER — LEVOTHYROXINE SODIUM 100 MCG PO TABS
100.0000 ug | ORAL_TABLET | Freq: Every day | ORAL | Status: DC
  Administered 2018-10-08 – 2018-10-10 (×2): 100 ug via ORAL
  Filled 2018-10-08 (×2): qty 1

## 2018-10-08 MED ORDER — CEFAZOLIN SODIUM-DEXTROSE 2-4 GM/100ML-% IV SOLN
2.0000 g | INTRAVENOUS | Status: AC
  Administered 2018-10-09: 2 g via INTRAVENOUS
  Filled 2018-10-08: qty 100

## 2018-10-08 MED ORDER — SODIUM CHLORIDE 0.9 % IV SOLN
80.0000 mg | INTRAVENOUS | Status: DC
  Filled 2018-10-08: qty 2

## 2018-10-08 MED ORDER — SODIUM CHLORIDE 0.9% FLUSH: 3.0000 mL | INTRAVENOUS | Status: DC | PRN

## 2018-10-08 MED ORDER — SODIUM CHLORIDE 0.9 % IV SOLN
INTRAVENOUS | Status: DC
  Administered 2018-10-09: 06:00:00 via INTRAVENOUS

## 2018-10-08 MED ORDER — HEPARIN SODIUM (PORCINE) 5000 UNIT/ML IJ SOLN
5000.0000 [IU] | Freq: Three times a day (TID) | INTRAMUSCULAR | Status: DC
  Administered 2018-10-09 – 2018-10-10 (×2): 5000 [IU] via SUBCUTANEOUS
  Filled 2018-10-08 (×3): qty 1

## 2018-10-08 MED ORDER — SODIUM CHLORIDE 0.9% FLUSH
3.0000 mL | Freq: Two times a day (BID) | INTRAVENOUS | Status: DC
  Administered 2018-10-08 – 2018-10-09 (×2): 3 mL via INTRAVENOUS

## 2018-10-08 NOTE — Anesthesia Preprocedure Evaluation (Addendum)
Anesthesia Evaluation  Patient identified by MRN, date of birth, ID band Patient awake    Reviewed: Allergy & Precautions, NPO status , Patient's Chart, lab work & pertinent test results, reviewed documented beta blocker date and time   History of Anesthesia Complications Negative for: history of anesthetic complications  Airway Mallampati: III       Dental  (+) Edentulous Upper, Edentulous Lower, Dental Advidsory Given   Pulmonary shortness of breath and with exertion, neg sleep apnea, COPD,  oxygen dependent, former smoker,           Cardiovascular hypertension, Pt. on medications and Pt. on home beta blockers (-) angina+CHF  (-) Past MI + dysrhythmias Atrial Fibrillation (-) Valvular Problems/Murmurs  Echo 10/07/18: - Left ventricle: Systolic function was normal. The estimated   ejection fraction was in the range of 50% to 55%. - Mitral valve: Calcified annulus. There was mild regurgitation. - Right ventricle: The cavity size was mildly dilated. - Tricuspid valve: There was moderate regurgitation. - Pulmonary arteries: PA peak pressure: 42 mm Hg (S).   Neuro/Psych negative neurological ROS  negative psych ROS   GI/Hepatic negative GI ROS, Neg liver ROS,   Endo/Other  neg diabetesHypothyroidism Morbid obesity  Renal/GU Renal Insufficiency and ARFRenal disease     Musculoskeletal   Abdominal   Peds negative pediatric ROS (+)  Hematology  (+) Blood dyscrasia, anemia ,   Anesthesia Other Findings Past Medical History: No date: Atrial flutter (HCC)     Comment:  a. s/p TEE/DCCV 10/18 @ [facility] complicated by asystole and              junctional rhythm requiring epi, atropine, and CPR x < 1               minute; b. amio and Eliquis; c. CHADS2VASC => 6 (CHF,               HTN, age x 2, vascular disease, female) No date: Chronic combined systolic and diastolic CHF (congestive  heart failure) (HCC) No date: CKD (chronic  kidney disease), stage II No date: COPD (chronic obstructive pulmonary disease) (HCC) No date: Hypertension No date: Hypothyroidism No date: Iron deficiency anemia No date: Morbid obesity (HCC) No date: Noncompliance  Reproductive/Obstetrics negative OB ROS                          Anesthesia Physical Anesthesia Plan  ASA: IV  Anesthesia Plan: General   Post-op Pain Management:    Induction: Intravenous  PONV Risk Score and Plan: 3 and Propofol infusion and TIVA  Airway Management Planned: Nasal Cannula, Natural Airway and Simple Face Mask  Additional Equipment:   Intra-op Plan:   Post-operative Plan:   Informed Consent:   Plan Discussed with:   Anesthesia Plan Comments:       Anesthesia Quick Evaluation

## 2018-10-08 NOTE — Progress Notes (Signed)
Pt completed order AP & Lat.

## 2018-10-08 NOTE — Progress Notes (Signed)
Patient Name: Danielle Munoz Date of Encounter: 10/08/2018  Hospital Problem List     Active Problems:   Bradycardia    Patient Profile     Heart rate and blood pressure are better on dopamine.  Once stopping dopamine heart rate dropping into the 20s and 30s.  Will likely need dual-chamber backup pacing.  Subjective   Less short of breath and confused today.  Inpatient Medications    . arformoterol  15 mcg Nebulization BID  . atorvastatin  40 mg Oral QHS  . docusate sodium  100 mg Oral BID  . heparin injection (subcutaneous)  5,000 Units Subcutaneous Q8H  . ipratropium  0.5 mg Nebulization Q12H  . levothyroxine  100 mcg Oral Q0600  . sodium chloride flush  3 mL Intravenous Q12H  . torsemide  40 mg Oral Daily    Vital Signs    Vitals:   10/08/18 0400 10/08/18 0700 10/08/18 0800 10/08/18 0851  BP: (!) 130/52 (!) 142/63 128/81   Pulse: 62 68 66   Resp: 15 16 15    Temp:      TempSrc:      SpO2: 98%   96%  Weight:      Height:        Intake/Output Summary (Last 24 hours) at 10/08/2018 1236 Last data filed at 10/08/2018 1108 Gross per 24 hour  Intake 294.04 ml  Output 900 ml  Net -605.96 ml   Filed Weights   10/06/18 1354 10/07/18 0057  Weight: 124.7 kg 117 kg    Physical Exam    GEN: Well nourished, well developed, in no acute distress.  HEENT: normal.  Neck: Supple, no JVD, carotid bruits, or masses. Cardiac: RRR, no murmurs, rubs, or gallops. No clubbing, cyanosis, edema.  Radials/DP/PT 2+ and equal bilaterally.  Respiratory:  Respirations regular and unlabored, clear to auscultation bilaterally. GI: Soft, nontender, nondistended, BS + x 4. MS: no deformity or atrophy. Skin: warm and dry, no rash. Neuro:  Strength and sensation are intact. Psych: Normal affect.  Labs    CBC Recent Labs    10/07/18 0645 10/08/18 0535  WBC 12.5* 11.3*  HGB 11.1* 11.7*  HCT 40.3 41.9  MCV 77.6* 76.2*  PLT 217 237   Basic Metabolic Panel Recent Labs   10/07/18 0645 10/08/18 0535  NA 141 142  K 4.1 3.9  CL 102 101  CO2 31 32  GLUCOSE 96 89  BUN 45* 48*  CREATININE 3.05* 2.90*  CALCIUM 8.4* 8.4*   Liver Function Tests Recent Labs    10/06/18 1411  AST 19  ALT 18  ALKPHOS 99  BILITOT 0.9  PROT 6.3*  ALBUMIN 3.2*   No results for input(s): LIPASE, AMYLASE in the last 72 hours. Cardiac Enzymes Recent Labs    10/06/18 1411 10/07/18 0110 10/07/18 0645  TROPONINI 0.05* 0.05* 0.04*   BNP Recent Labs    10/06/18 1411  BNP 1,056.0*   D-Dimer No results for input(s): DDIMER in the last 72 hours. Hemoglobin A1C No results for input(s): HGBA1C in the last 72 hours. Fasting Lipid Panel No results for input(s): CHOL, HDL, LDLCALC, TRIG, CHOLHDL, LDLDIRECT in the last 72 hours. Thyroid Function Tests Recent Labs    10/06/18 1411  TSH 22.701*    Telemetry    Sinus rhythm with intermittent sinus bradycardia  ECG    Sinus bradycardia  Radiology    Dg Chest 2 View  Result Date: 09/12/2018 CLINICAL DATA:  78 year old female with shortness of  breath. EXAM: CHEST - 2 VIEW COMPARISON:  Chest radiograph dated 11/08/2017 FINDINGS: There is cardiomegaly with vascular congestion. Diffuse interstitial coarsening and bronchitic changes. Left lung base density may represent combination of a small pleural effusion or atelectasis although infiltrate is not excluded. Clinical correlation is recommended. No pneumothorax. Atherosclerotic calcification of the aortic arch. Osteopenia with degenerative changes of the spine. No acute osseous pathology. IMPRESSION: 1. Cardiomegaly with vascular congestion . 2. Left lung base atelectasis versus infiltrate. Possible small left pleural effusion. Electronically Signed   By: Elgie CollardArash  Radparvar M.D.   On: 09/12/2018 03:57   [place] Chest (pleural Effusion)  Result Date: 10/07/2018 CLINICAL DATA:  Acute on chronic respiratory failure. Evaluate for pleural fluid and thoracentesis. EXAM: CHEST ULTRASOUND  COMPARISON:  Chest radiograph 10/06/2018 FINDINGS: Both sides of the chest were evaluated with ultrasound. A small amount of subpulmonic pleural fluid seen on both sides. Fluid was not amendable for thoracentesis due to overlying lung. IMPRESSION: Small amount of pleural fluid bilaterally. Fluid is not amendable for thoracentesis. Electronically Signed   By: Richarda OverlieAdam  Henn M.D.   On: 10/07/2018 10:58   Dg Chest Portable 1 View  Result Date: 10/06/2018 CLINICAL DATA:  Shortness of breath and bradycardia. EXAM: PORTABLE CHEST 1 VIEW COMPARISON:  09/12/2018 FINDINGS: There is a large left pleural effusion which appears increased in volume compared with the previous exam. No significant right effusion identified. Mild diffuse edema identified. IMPRESSION: 1. Significant increase in volume of left pleural effusion. 2. Pulmonary edema. Electronically Signed   By: Signa Kellaylor  Stroud M.D.   On: 10/06/2018 14:48    Assessment & Plan    78 year old female with history of paroxysmal atrial fibrillation, history of high-grade heart block with bradycardia which is symptomatic.  Was on amiodarone and Eliquis.  This is been discontinued.  Patient had acute on chronic renal insufficiency.  Heart rate is maintained with dopamine however when this is discontinued patient developed worsening bradycardia with symptomatic.  Will need backup dual-chamber pacemaker.  This is scheduled for tomorrow.  Continue the dopamine to support heart rate and blood pressure until then.  Would remain off of Eliquis and amiodarone until this can be completed.  Will consider resuming these drugs after pacemaker is inserted and functioning normally. Signed, Darlin PriestlyKenneth A. Tremaine Fuhriman MD 10/08/2018, 12:36 PM  Pager: [phone]

## 2018-10-08 NOTE — Progress Notes (Signed)
Patient Name: Danielle Munoz Date of Encounter: 10/08/2018  Hospital Problem List     Active Problems:   Bradycardia    Patient Profile     78 year old female with history of hyperlipidemia, acute renal insufficiency, hyperlipidemia who was admitted with severe bradycardia.  Had been on amiodarone and metoprolol.  Eliquis is also been stopped.  Placed on dopamine for blood pressure and heart rate control.  No significant bradycardia overnight while on dopamine.  Subjective   Somewhat more active.  Able to move from bed to chair with assistance.  Denies chest pain.  Denies shortness of breath.  Still has peripheral edema.  Inpatient Medications    . arformoterol  15 mcg Nebulization BID  . atorvastatin  40 mg Oral QHS  . docusate sodium  100 mg Oral BID  . ipratropium  0.5 mg Nebulization Q12H  . levothyroxine  50 mcg Intravenous Daily  . sodium chloride flush  3 mL Intravenous Q12H  . torsemide  40 mg Oral Daily    Vital Signs    Vitals:   10/08/18 0300 10/08/18 0400 10/08/18 0700 10/08/18 0800  BP: (!) 103/52 (!) 130/52 (!) 142/63 128/81  Pulse: 64 62 68 66  Resp: 19 15 16 15   Temp: 97.8 F (36.6 C)     TempSrc: Oral     SpO2: 96% 98%    Weight:      Height:        Intake/Output Summary (Last 24 hours) at 10/08/2018 0835 Last data filed at 10/08/2018 0400 Gross per 24 hour  Intake 534.04 ml  Output 450 ml  Net 84.04 ml   Filed Weights   10/06/18 1354 10/07/18 0057  Weight: 124.7 kg 117 kg    Physical Exam    GEN: Well nourished, well developed, in no acute distress.  HEENT: normal.  Neck: Supple, no JVD, carotid bruits, or masses. Cardiac: RRR, no murmurs, rubs, or gallops. No clubbing, cyanosis, edema.  Radials/DP/PT 2+ and equal bilaterally.  Respiratory:  Respirations regular and unlabored, clear to auscultation bilaterally. GI: Soft, nontender, nondistended, BS + x 4. MS: no deformity or atrophy. Skin: warm and dry, no rash. Neuro:  Strength and  sensation are intact. Psych: Normal affect.  Labs    CBC Recent Labs    10/07/18 0645 10/08/18 0535  WBC 12.5* 11.3*  HGB 11.1* 11.7*  HCT 40.3 41.9  MCV 77.6* 76.2*  PLT 217 237   Basic Metabolic Panel Recent Labs    81/19/1400/04/19 0645 10/08/18 0535  NA 141 142  K 4.1 3.9  CL 102 101  CO2 31 32  GLUCOSE 96 89  BUN 45* 48*  CREATININE 3.05* 2.90*  CALCIUM 8.4* 8.4*   Liver Function Tests Recent Labs    10/06/18 1411  AST 19  ALT 18  ALKPHOS 99  BILITOT 0.9  PROT 6.3*  ALBUMIN 3.2*   No results for input(s): LIPASE, AMYLASE in the last 72 hours. Cardiac Enzymes Recent Labs    10/06/18 1411 10/07/18 0110 10/07/18 0645  TROPONINI 0.05* 0.05* 0.04*   BNP Recent Labs    10/06/18 1411  BNP 1,056.0*   D-Dimer No results for input(s): DDIMER in the last 72 hours. Hemoglobin A1C No results for input(s): HGBA1C in the last 72 hours. Fasting Lipid Panel No results for input(s): CHOL, HDL, LDLCALC, TRIG, CHOLHDL, LDLDIRECT in the last 72 hours. Thyroid Function Tests Recent Labs    10/06/18 1411  TSH 22.701*    Telemetry  Sinus rhythm in the 60s to 80s.  ECG    Bradycardia  Radiology    Dg Chest 2 View  Result Date: 09/12/2018 CLINICAL DATA:  78 year old female with shortness of breath. EXAM: CHEST - 2 VIEW COMPARISON:  Chest radiograph dated 11/08/2017 FINDINGS: There is cardiomegaly with vascular congestion. Diffuse interstitial coarsening and bronchitic changes. Left lung base density may represent combination of a small pleural effusion or atelectasis although infiltrate is not excluded. Clinical correlation is recommended. No pneumothorax. Atherosclerotic calcification of the aortic arch. Osteopenia with degenerative changes of the spine. No acute osseous pathology. IMPRESSION: 1. Cardiomegaly with vascular congestion . 2. Left lung base atelectasis versus infiltrate. Possible small left pleural effusion. Electronically Signed   By: Elgie Collard M.D.   On: 09/12/2018 03:57   [place] Chest (pleural Effusion)  Result Date: 10/07/2018 CLINICAL DATA:  Acute on chronic respiratory failure. Evaluate for pleural fluid and thoracentesis. EXAM: CHEST ULTRASOUND COMPARISON:  Chest radiograph 10/06/2018 FINDINGS: Both sides of the chest were evaluated with ultrasound. A small amount of subpulmonic pleural fluid seen on both sides. Fluid was not amendable for thoracentesis due to overlying lung. IMPRESSION: Small amount of pleural fluid bilaterally. Fluid is not amendable for thoracentesis. Electronically Signed   By: Richarda Overlie M.D.   On: 10/07/2018 10:58   Dg Chest Portable 1 View  Result Date: 10/06/2018 CLINICAL DATA:  Shortness of breath and bradycardia. EXAM: PORTABLE CHEST 1 VIEW COMPARISON:  09/12/2018 FINDINGS: There is a large left pleural effusion which appears increased in volume compared with the previous exam. No significant right effusion identified. Mild diffuse edema identified. IMPRESSION: 1. Significant increase in volume of left pleural effusion. 2. Pulmonary edema. Electronically Signed   By: Signa Kell M.D.   On: 10/06/2018 14:48    Assessment & Plan    78 year old female admitted with acute on chronic renal insufficiency creatinine up to 3.3 also noted to have significant bradycardia.  Was on Eliquis, amiodarone.  This is been discontinued.  Patient did respond to atropine was placed on dopamine overnight with improvement in heart rate and blood pressure.  Will attempt to wean dopamine today following blood pressure and heart rate.  Awaiting washout of Eliquis and amiodarone.  Should she remain bradycardic after at least 24 more hours being off amiodarone and Eliquis, consideration for permanent pacing could be discussed.  Renal insufficiency-we will need to follow renal function for improvement    Signed, Iantha Fallen A. Zarielle Cea MD 10/08/2018, 8:35 AM  Pager: [phone]

## 2018-10-08 NOTE — Progress Notes (Signed)
Turned dopamine off at 0835. Pt had a pause and HR dropped to 20's and 30's sustained. Pt asymptomatic with drop in HR. Pt started back on dopamine at 0900 and MD notified. Will continue to monitor pt closely.

## 2018-10-08 NOTE — Treatment Plan (Addendum)
Continues to have issues with bradycardia and significant pauses particularly when not on dopamine.  She will need pacemaker.  Defer to cardiology.  Discussed during multidisciplinary rounds only recommendation is to follow cardiology input.  Thoracentesis not done yesterday as fluid amount was minimal.

## 2018-10-08 NOTE — Progress Notes (Addendum)
[name], female   DOB: [birth year], 78 y.o.   MRN: [id number]  [employer] PROGRESS NOTE  FALICIA IMRIE MGN:[id number] DOB: 16-Sep-1941 DOA: 10/06/2018 PCP: Clinic, [facility]  HPI/Subjective:  ON dopamine drip and HR 70 earlier. Sitting in a chair. Pleasant  Objective: Vitals:   10/08/18 1200 10/08/18 1300  BP: (!) 155/99   Pulse: 72 71  Resp: (!) 32 (!) 21  Temp:    SpO2:      Filed Weights   10/06/18 1354 10/07/18 0057  Weight: 124.7 kg 117 kg    ROS: Review of Systems  Constitutional: Negative for chills and fever.  Eyes: Negative for blurred vision.  Respiratory: Positive for shortness of breath. Negative for cough.   Cardiovascular: Negative for chest pain.  Gastrointestinal: Negative for abdominal pain, constipation, diarrhea, nausea and vomiting.  Genitourinary: Negative for dysuria.  Musculoskeletal: Negative for joint pain.  Neurological: Negative for dizziness and headaches.   Exam: Physical Exam  Constitutional: She is oriented to person, place, and time.  HENT:  Nose: No mucosal edema.  Mouth/Throat: No oropharyngeal exudate or posterior oropharyngeal edema.  Eyes: Pupils are equal, round, and reactive to light. Conjunctivae, EOM and lids are normal.  Neck: No JVD present. Carotid bruit is not present. No edema present. No thyroid mass and no thyromegaly present.  Cardiovascular: S1 normal and S2 normal. Exam reveals no gallop.  No murmur heard. Pulses:      Dorsalis pedis pulses are 2+ on the right side and 2+ on the left side.  Respiratory: No respiratory distress. She has decreased breath sounds in the right lower field and the left lower field. She has no wheezes. She has no rhonchi. She has rales in the right lower field and the left lower field.  GI: Soft. Bowel sounds are normal. There is no abdominal tenderness.  Musculoskeletal:     Right ankle: She exhibits swelling.     Left ankle: She exhibits swelling.   Lymphadenopathy:    She has no cervical adenopathy.  Neurological: She is alert and oriented to person, place, and time. No cranial nerve deficit.  Skin: Skin is warm. Nails show no clubbing.  Chronic lower extremity lymphedema bilaterally with chronic lower extremity discoloration  Psychiatric: She has a normal mood and affect.      Data Reviewed: Basic Metabolic Panel: Recent Labs  Lab 10/06/18 1411 10/07/18 0645 10/08/18 0535  NA 141 141 142  K 4.1 4.1 3.9  CL 101 102 101  CO2 30 31 32  GLUCOSE 116* 96 89  BUN 41* 45* 48*  CREATININE 2.68* 3.05* 2.90*  CALCIUM 8.7* 8.4* 8.4*   Liver Function Tests: Recent Labs  Lab 10/06/18 1411  AST 19  ALT 18  ALKPHOS 99  BILITOT 0.9  PROT 6.3*  ALBUMIN 3.2*   CBC: Recent Labs  Lab 10/06/18 1411 10/07/18 0645 10/08/18 0535  WBC 11.6* 12.5* 11.3*  HGB 11.6* 11.1* 11.7*  HCT 41.2 40.3 41.9  MCV 76.3* 77.6* 76.2*  PLT 263 217 237   Cardiac Enzymes: Recent Labs  Lab 10/06/18 1411 10/07/18 0110 10/07/18 0645  TROPONINI 0.05* 0.05* 0.04*   BNP (last 3 results) Recent Labs    11/07/17 2119 10/06/18 1411  BNP 1,362.0* 1,056.0*     CBG: Recent Labs  Lab 10/07/18 0047  GLUCAP 110*    Recent Results (from the past 240 hour(s))  MRSA PCR Screening     Status: None   Collection Time: 10/07/18  12:56 AM  Result Value Ref Range Status   MRSA by PCR NEGATIVE NEGATIVE Final    Comment:        The GeneXpert MRSA Assay (FDA approved for NASAL specimens only), is one component of a comprehensive MRSA colonization surveillance program. It is not intended to diagnose MRSA infection nor to guide or monitor treatment for MRSA infections. Performed at [facility], [address], [place], [place] [address]      Studies: [place] Chest (pleural Effusion)  Result Date: 10/07/2018 CLINICAL DATA:  Acute on chronic respiratory failure. Evaluate for pleural fluid and thoracentesis. EXAM: CHEST ULTRASOUND  COMPARISON:  Chest radiograph 10/06/2018 FINDINGS: Both sides of the chest were evaluated with ultrasound. A small amount of subpulmonic pleural fluid seen on both sides. Fluid was not amendable for thoracentesis due to overlying lung. IMPRESSION: Small amount of pleural fluid bilaterally. Fluid is not amendable for thoracentesis. Electronically Signed   By: Richarda Overlie M.D.   On: 10/07/2018 10:58    Scheduled Meds: . arformoterol  15 mcg Nebulization BID  . atorvastatin  40 mg Oral QHS  . docusate sodium  100 mg Oral BID  . heparin injection (subcutaneous)  5,000 Units Subcutaneous Q8H  . ipratropium  0.5 mg Nebulization Q12H  . levothyroxine  100 mcg Oral Q0600  . sodium chloride flush  3 mL Intravenous Q12H  . torsemide  40 mg Oral Daily   Continuous Infusions: . DOPamine 2.5 mcg/kg/min (10/08/18 0859)    Assessment/Plan:  1. Severe bradycardia.  Patient started on dopamine drip.  Awaiting amiodarone and metoprolol to get out of the system.  Cardiology following.  Heart rate drops into 20s and 30s when dopamine is stopped.  Will need pacemaker 2. Acute kidney injury with acute diastolic congestive heart failure.  The acute kidney injury is likely from poor perfusion with severe bradycardia.  Mild improvement today. 3. History of atrial fibrillation.  Holding rate controlling medications and Eliquis at this time 4. Hyperlipidemia unspecified on atorvastatin 5. Hypothyroidism . TSH significantly elevated due to non compliance. Back on levothyroxine now.  Code Status:     Code Status Orders  (From admission, onward)         Start     Ordered   10/06/18 1544  Full code  Continuous     10/06/18 1547        Code Status History    Date Active Date Inactive Code Status Order ID Comments User Context   09/12/2018 0556 09/15/2018 2306 Full Code [id number]  Arnaldo Natal, MD Inpatient   11/08/2017 0102 11/11/2017 2121 Full Code [id number]  Oralia Manis, MD ED     Disposition  Plan: To be determined  Consultants:  Critical care team  Cardiology  Time spent: 30 minutes  Fatimah Sundquist R Giovanne Nickolson  [employer]

## 2018-10-09 ENCOUNTER — Encounter
Admission: EM | Disposition: A | Discharge status: Skilled Nursing Facility | Payer: Self-pay | Source: Home / Self Care | Attending: Internal Medicine

## 2018-10-09 ENCOUNTER — Inpatient Hospital Stay: Payer: Medicare Other

## 2018-10-09 ENCOUNTER — Inpatient Hospital Stay: Payer: Medicare Other | Admitting: Anesthesiology

## 2018-10-09 ENCOUNTER — Other Ambulatory Visit: Payer: Self-pay

## 2018-10-09 ENCOUNTER — Encounter: Payer: Self-pay | Admitting: Anesthesiology

## 2018-10-09 HISTORY — PX: PACEMAKER IMPLANT: EP1218

## 2018-10-09 LAB — BASIC METABOLIC PANEL
Anion gap: 8 (ref 5–15)
BUN: 40 mg/dL — ABNORMAL HIGH (ref 8–23)
CO2: 39 mmol/L — AB (ref 22–32)
Calcium: 8.6 mg/dL — ABNORMAL LOW (ref 8.9–10.3)
Chloride: 98 mmol/L (ref 98–111)
Creatinine, Ser: 2 mg/dL — ABNORMAL HIGH (ref 0.44–1.00)
GFR calc non Af Amer: 24 mL/min — ABNORMAL LOW (ref >60)
GFR, EST AFRICAN AMERICAN: 27 mL/min — AB (ref >60)
Glucose, Bld: 101 mg/dL — ABNORMAL HIGH (ref 70–99)
Potassium: 3.5 mmol/L (ref 3.5–5.1)
Sodium: 145 mmol/L (ref 135–145)

## 2018-10-09 LAB — CBC
HCT: 43.9 % (ref 36.0–46.0)
Hemoglobin: 12.2 g/dL (ref 12.0–15.0)
MCH: 21.4 pg — ABNORMAL LOW (ref 26.0–34.0)
MCHC: 27.8 g/dL — AB (ref 30.0–36.0)
MCV: 76.9 fL — ABNORMAL LOW (ref 80.0–100.0)
Platelets: 239 10*3/uL (ref 150–400)
RBC: 5.71 MIL/uL — ABNORMAL HIGH (ref 3.87–5.11)
RDW: 22.6 % — ABNORMAL HIGH (ref 11.5–15.5)
WBC: 11.1 10*3/uL — ABNORMAL HIGH (ref 4.0–10.5)
nRBC: 0 % (ref 0.0–0.2)

## 2018-10-09 LAB — GLUCOSE, CAPILLARY: Glucose-Capillary: 90 mg/dL (ref 70–99)

## 2018-10-09 SURGERY — PACEMAKER IMPLANT
Anesthesia: General

## 2018-10-09 SURGERY — INSERTION, CARDIAC PACEMAKER
Anesthesia: Choice | Laterality: Left

## 2018-10-09 MED ORDER — MIDAZOLAM HCL 2 MG/2ML IJ SOLN
INTRAMUSCULAR | Status: AC
  Filled 2018-10-09: qty 2

## 2018-10-09 MED ORDER — LIDOCAINE HCL (PF) 2 % IJ SOLN
INTRAMUSCULAR | Status: AC
  Filled 2018-10-09: qty 10

## 2018-10-09 MED ORDER — MIDAZOLAM HCL 2 MG/2ML IJ SOLN
INTRAMUSCULAR | Status: DC | PRN
  Administered 2018-10-09 (×2): 1 mg via INTRAVENOUS

## 2018-10-09 MED ORDER — LIDOCAINE 1 % OPTIME INJ - NO CHARGE
INTRAMUSCULAR | Status: DC | PRN
  Administered 2018-10-09: 30 mL

## 2018-10-09 MED ORDER — FENTANYL CITRATE (PF) 100 MCG/2ML IJ SOLN
INTRAMUSCULAR | Status: AC
  Filled 2018-10-09: qty 2

## 2018-10-09 MED ORDER — LORAZEPAM 2 MG/ML IJ SOLN
0.5000 mg | Freq: Once | INTRAMUSCULAR | Status: AC
  Administered 2018-10-09: 0.5 mg via INTRAVENOUS
  Filled 2018-10-09: qty 1

## 2018-10-09 MED ORDER — CEFAZOLIN SODIUM-DEXTROSE 1-4 GM/50ML-% IV SOLN
1.0000 g | Freq: Four times a day (QID) | INTRAVENOUS | Status: AC
  Administered 2018-10-09 – 2018-10-10 (×2): 1 g via INTRAVENOUS
  Filled 2018-10-09 (×4): qty 50

## 2018-10-09 MED ORDER — ACETAMINOPHEN 325 MG PO TABS: 325.0000 mg | ORAL_TABLET | ORAL | Status: DC | PRN

## 2018-10-09 MED ORDER — ONDANSETRON HCL 4 MG/2ML IJ SOLN: 4.0000 mg | Freq: Four times a day (QID) | INTRAMUSCULAR | Status: DC | PRN

## 2018-10-09 MED ORDER — ORAL CARE MOUTH RINSE
15.0000 mL | Freq: Two times a day (BID) | OROMUCOSAL | Status: DC
  Administered 2018-10-10: 15 mL via OROMUCOSAL

## 2018-10-09 MED ORDER — PROPOFOL 500 MG/50ML IV EMUL
INTRAVENOUS | Status: DC | PRN
  Administered 2018-10-09: 25 ug/kg/min via INTRAVENOUS

## 2018-10-09 MED ORDER — PROPOFOL 500 MG/50ML IV EMUL
INTRAVENOUS | Status: AC
  Filled 2018-10-09: qty 50

## 2018-10-09 MED ORDER — PROPOFOL 10 MG/ML IV BOLUS
INTRAVENOUS | Status: DC | PRN
  Administered 2018-10-09 (×7): 20 mg via INTRAVENOUS

## 2018-10-09 MED ORDER — MORPHINE SULFATE (PF) 2 MG/ML IV SOLN
0.5000 mg | Freq: Once | INTRAVENOUS | Status: AC
  Administered 2018-10-09: 0.5 mg via INTRAVENOUS
  Filled 2018-10-09: qty 1

## 2018-10-09 SURGICAL SUPPLY — 38 items
BAG DECANTER FOR FLEXI CONT (MISCELLANEOUS) ×3 IMPLANT
BRUSH SCRUB EZ  4% CHG (MISCELLANEOUS) ×2
BRUSH SCRUB EZ 4% CHG (MISCELLANEOUS) ×1 IMPLANT
CABLE SURG 12 DISP A/V CHANNEL (MISCELLANEOUS) ×3 IMPLANT
CANISTER SUCT 1200ML W/VALVE (MISCELLANEOUS) ×3 IMPLANT
CHLORAPREP W/TINT 26ML (MISCELLANEOUS) ×3 IMPLANT
COVER LIGHT HANDLE STERIS (MISCELLANEOUS) ×6 IMPLANT
COVER MAYO STAND STRL (DRAPES) ×3 IMPLANT
COVER WAND RF STERILE (DRAPES) ×3 IMPLANT
DRAPE C-ARM XRAY 36X54 (DRAPES) ×3 IMPLANT
DRSG TEGADERM 4X4.75 (GAUZE/BANDAGES/DRESSINGS) ×3 IMPLANT
DRSG TELFA 4X3 1S NADH ST (GAUZE/BANDAGES/DRESSINGS) ×3 IMPLANT
ELECT REM PT RETURN 9FT ADLT (ELECTROSURGICAL) ×3
ELECTRODE REM PT RTRN 9FT ADLT (ELECTROSURGICAL) ×1 IMPLANT
GLOVE BIO SURGEON STRL SZ7.5 (GLOVE) ×5 IMPLANT
GLOVE BIO SURGEON STRL SZ8 (GLOVE) ×5 IMPLANT
GOWN STRL REUS W/ TWL LRG LVL3 (GOWN DISPOSABLE) ×1 IMPLANT
GOWN STRL REUS W/ TWL XL LVL3 (GOWN DISPOSABLE) ×1 IMPLANT
GOWN STRL REUS W/TWL LRG LVL3 (GOWN DISPOSABLE) ×2
GOWN STRL REUS W/TWL XL LVL3 (GOWN DISPOSABLE) ×2
IMMOBILIZER SHDR MD LX WHT (SOFTGOODS) IMPLANT
IMMOBILIZER SHDR XL LX WHT (SOFTGOODS) ×2 IMPLANT
INTRO PACEMAKR LEAD 9FR 13CM (INTRODUCER) ×3
INTRO PACEMKR SHEATH II 7FR (MISCELLANEOUS) ×3
INTRODUCER PACEMKR LD 9FR 13CM (INTRODUCER) IMPLANT
INTRODUCER PACEMKR SHTH II 7FR (MISCELLANEOUS) ×1 IMPLANT
IPG PACE AZUR XT SR MRI W1SR01 (Pacemaker) IMPLANT
IV NS 500ML (IV SOLUTION) ×2
IV NS 500ML BAXH (IV SOLUTION) ×1 IMPLANT
KIT TURNOVER KIT A (KITS) ×3 IMPLANT
LABEL OR SOLS (LABEL) ×3 IMPLANT
LEAD CAPSURE NOVUS 5076-52CM (Lead) ×2 IMPLANT
MARKER SKIN DUAL TIP RULER LAB (MISCELLANEOUS) ×3 IMPLANT
PACE AZURE XT SR MRI W1SR01 (Pacemaker) ×3 IMPLANT
PACEMAKER LEAD ATRL (Lead) ×2 IMPLANT
PACK PACE INSERTION (MISCELLANEOUS) ×3 IMPLANT
PAD ONESTEP ZOLL R SERIES ADT (MISCELLANEOUS) ×3 IMPLANT
SUT SILK 0 SH 30 (SUTURE) ×7 IMPLANT

## 2018-10-09 NOTE — Progress Notes (Signed)
Patient with symptomatic junctional bradycardia, requires dual chamber pacemaker.

## 2018-10-09 NOTE — Anesthesia Post-op Follow-up Note (Signed)
Anesthesia QCDR form completed.        

## 2018-10-09 NOTE — Progress Notes (Signed)
Patient ID: Danielle MoatsMarian L Baggerly, female   DOB: [birth year], 78 y.o.   MRN: [id number]  [employer] PROGRESS NOTE  Danielle MoatsMarian L Guido [id number] DOB: [birth year] DOA: 10/06/2018 PCP: Clinic, [facility]  HPI/Subjective: Patient seen prior to pacemaker.  Her major complaint was that she was thirsty.  She always has a little shortness of breath.  Objective: Vitals:   10/09/18 1100 10/09/18 1200  BP: (!) 165/62 (!) 158/84  Pulse: 75 68  Resp: (!) 22 17  Temp:  97.9 F (36.6 C)  SpO2: 100% 97%    Filed Weights   10/06/18 1354 10/07/18 0057  Weight: 124.7 kg 117 kg    ROS: Review of Systems  Constitutional: Negative for chills and fever.  Eyes: Negative for blurred vision.  Respiratory: Positive for shortness of breath. Negative for cough.   Cardiovascular: Negative for chest pain.  Gastrointestinal: Negative for abdominal pain, constipation, diarrhea, nausea and vomiting.  Genitourinary: Negative for dysuria.  Musculoskeletal: Negative for joint pain.  Neurological: Negative for dizziness and headaches.   Exam: Physical Exam  Constitutional: She is oriented to person, place, and time.  HENT:  Nose: No mucosal edema.  Mouth/Throat: No oropharyngeal exudate or posterior oropharyngeal edema.  Eyes: Pupils are equal, round, and reactive to light. Conjunctivae, EOM and lids are normal.  Neck: No JVD present. Carotid bruit is not present. No edema present. No thyroid mass and no thyromegaly present.  Cardiovascular: S1 normal and S2 normal. Exam reveals no gallop.  No murmur heard. Pulses:      Dorsalis pedis pulses are 2+ on the right side and 2+ on the left side.  Respiratory: No respiratory distress. She has decreased breath sounds in the right lower field and the left lower field. She has no wheezes. She has no rhonchi. She has rales in the right lower field and the left lower field.  GI: Soft. Bowel sounds are normal. There is no abdominal tenderness.  Musculoskeletal:      Right ankle: She exhibits swelling.     Left ankle: She exhibits swelling.  Lymphadenopathy:    She has no cervical adenopathy.  Neurological: She is alert and oriented to person, place, and time. No cranial nerve deficit.  Skin: Skin is warm. Nails show no clubbing.  Chronic lower extremity lymphedema bilaterally with chronic lower extremity discoloration  Psychiatric: She has a normal mood and affect.      Data Reviewed: Basic Metabolic Panel: Recent Labs  Lab 10/06/18 1411 10/07/18 0645 10/08/18 0535 10/09/18 0543  NA 141 141 142 145  K 4.1 4.1 3.9 3.5  CL 101 102 101 98  CO2 30 31 32 39*  GLUCOSE 116* 96 89 101*  BUN 41* 45* 48* 40*  CREATININE 2.68* 3.05* 2.90* 2.00*  CALCIUM 8.7* 8.4* 8.4* 8.6*   Liver Function Tests: Recent Labs  Lab 10/06/18 1411  AST 19  ALT 18  ALKPHOS 99  BILITOT 0.9  PROT 6.3*  ALBUMIN 3.2*   CBC: Recent Labs  Lab 10/06/18 1411 10/07/18 0645 10/08/18 0535 10/09/18 0543  WBC 11.6* 12.5* 11.3* 11.1*  HGB 11.6* 11.1* 11.7* 12.2  HCT 41.2 40.3 41.9 43.9  MCV 76.3* 77.6* 76.2* 76.9*  PLT 263 217 237 239   Cardiac Enzymes: Recent Labs  Lab 10/06/18 1411 10/07/18 0110 10/07/18 0645  TROPONINI 0.05* 0.05* 0.04*   BNP (last 3 results) Recent Labs    11/07/17 2119 10/06/18 1411  BNP 1,362.0* 1,056.0*     CBG: Recent Labs  Lab  10/07/18 0047 10/09/18 0403  GLUCAP 110* 90    Recent Results (from the past 240 hour(s))  MRSA PCR Screening     Status: None   Collection Time: 10/07/18 12:56 AM  Result Value Ref Range Status   MRSA by PCR NEGATIVE NEGATIVE Final    Comment:        The GeneXpert MRSA Assay (FDA approved for NASAL specimens only), is one component of a comprehensive MRSA colonization surveillance program. It is not intended to diagnose MRSA infection nor to guide or monitor treatment for MRSA infections. Performed at [facility], [address] [place], [place] [address]       Studies: Dg Chest 2 View  Result Date: 10/09/2018 CLINICAL DATA:  Dyspnea EXAM: CHEST - 2 VIEW COMPARISON:  10/06/2018 FINDINGS: Interval decrease in left-sided pleural effusion. Blunting of the posterior costophrenic angles from small bilateral pleural effusions are now identified. Cardiomegaly with interstitial edema and pulmonary vascular redistribution is redemonstrated without significant interval change. Nonaneurysmal atherosclerosis of the aortic arch. Thoracic spondylosis is noted. No acute osseous abnormality is seen. IMPRESSION: Cardiomegaly with interstitial edema and small bilateral pleural effusions. Interval decrease in left-sided pleural effusion. Electronically Signed   By: Tollie Ethavid  Kwon M.D.   On: 10/09/2018 01:28   Dg C-arm 1-60 Min-no Report  Result Date: 10/09/2018 Fluoroscopy was utilized by the requesting physician.  No radiographic interpretation.    Scheduled Meds: . [MAR Hold] arformoterol  15 mcg Nebulization BID  . [MAR Hold] atorvastatin  40 mg Oral QHS  . chlorhexidine  60 mL Topical Once  . chlorhexidine  60 mL Topical Once  . [MAR Hold] docusate sodium  100 mg Oral BID  . gentamicin irrigation  80 mg Irrigation On Call  . [MAR Hold] heparin injection (subcutaneous)  5,000 Units Subcutaneous Q8H  . [MAR Hold] ipratropium  0.5 mg Nebulization Q12H  . [MAR Hold] levothyroxine  100 mcg Oral Q0600  . [MAR Hold] mouth rinse  15 mL Mouth Rinse BID  . [MAR Hold] sodium chloride flush  3 mL Intravenous Q12H  . sodium chloride flush  3 mL Intravenous Q12H  . [MAR Hold] torsemide  40 mg Oral Daily   Continuous Infusions: . sodium chloride 50 mL/hr at 10/09/18 1200  . sodium chloride    . [MAR Hold] DOPamine Stopped (10/09/18 0728)    Assessment/Plan:  1. Severe bradycardia.  Pacemaker today.  Should be able to get out to telemetry after that.  May be able to restart rate controlling medications later today or tomorrow. 2. Acute kidney injury with acute diastolic  congestive heart failure.  The acute kidney injury is likely from poor perfusion with severe bradycardia.  Patient was on IV fluid prior to procedure. 3. History of atrial fibrillation.  Holding rate controlling medications and Eliquis at this time 4. Hyperlipidemia unspecified on atorvastatin 5. Hypothyroidism unspecified on levothyroxine 6. Patient states she only wears oxygen at night  Code Status:     Code Status Orders  (From admission, onward)         Start     Ordered   10/06/18 1544  Full code  Continuous     10/06/18 1547        Code Status History    Date Active Date Inactive Code Status Order ID Comments User Context   09/12/2018 0556 09/15/2018 2306 Full Code [id number]  Arnaldo Nataliamond, Michael S, MD Inpatient   11/08/2017 0102 11/11/2017 2121 Full Code [id number]  Oralia ManisWillis, David, MD ED  Disposition Plan: To be determined  Consultants:  Critical care team  Cardiology  Time spent: 28 minutes.  Spoke with family at the bedside  [employer]

## 2018-10-09 NOTE — Transfer of Care (Signed)
Immediate Anesthesia Transfer of Care Note  Patient: Danielle Munoz  Procedure(s) Performed: INSERTION PACEMAKER-SINGLE CHAMBER (N/A )  Patient Location: PACU  Anesthesia Type:General  Level of Consciousness: sedated  Airway & Oxygen Therapy: Patient Spontanous Breathing and Patient connected to face mask oxygen  Post-op Assessment: Report given to RN and Post -op Vital signs reviewed and stable  Post vital signs: Reviewed and stable  Last Vitals:  Vitals Value Taken Time  BP 153/66 10/09/2018  2:26 PM  Temp    Pulse 88 10/09/2018  2:30 PM  Resp 23 10/09/2018  2:30 PM  SpO2 90 % 10/09/2018  2:30 PM  Vitals shown include unvalidated device data.  Last Pain:  Vitals:   10/09/18 1200  TempSrc: Axillary  PainSc: 0-No pain         Complications: No apparent anesthesia complications

## 2018-10-09 NOTE — Progress Notes (Signed)
Pt belligerent with staff. She is demanding ice water despite being NPO.  Pt is alert and oriented x 4.  When asked, she understands why she is NPO for her procedure.  She states "I don't care, I want a cup of ice water anyway."  Pt provided with mouth swabs.  she continues to yell at all of the staff members she encounters.

## 2018-10-09 NOTE — Progress Notes (Signed)
Patient Name: Danielle Munoz Date of Encounter: 10/09/2018  Hospital Problem List     Active Problems:   Bradycardia    Patient Profile     Feels somewhat better.  Had been on dopamine for blood pressure and heart rate control.  Has remained in sinus rhythm with relative hypotension in the last 24 hours.  Will discontinue dopamine and follow heart rate.  Patient has had significant bradycardia in the past.  She has also had paroxysmal atrial fibrillation with rapid ventricular response.  Scheduled for dual-chamber backup pacing today to allow better control of her atrial fibrillation with AV nodal drugs.  Subjective   Complains of lower extremity edema  Inpatient Medications    . arformoterol  15 mcg Nebulization BID  . atorvastatin  40 mg Oral QHS  . chlorhexidine  60 mL Topical Once  . chlorhexidine  60 mL Topical Once  . docusate sodium  100 mg Oral BID  . gentamicin irrigation  80 mg Irrigation On Call  . heparin injection (subcutaneous)  5,000 Units Subcutaneous Q8H  . ipratropium  0.5 mg Nebulization Q12H  . levothyroxine  100 mcg Oral Q0600  . sodium chloride flush  3 mL Intravenous Q12H  . sodium chloride flush  3 mL Intravenous Q12H  . torsemide  40 mg Oral Daily    Vital Signs    Vitals:   10/09/18 0400 10/09/18 0430 10/09/18 0500 10/09/18 0530  BP: (!) 173/76 (!) 181/86 (!) 174/77 (!) 172/78  Pulse: 72 74 80 82  Resp: (!) 25 20 15  (!) 24  Temp:      TempSrc:      SpO2: 95% 96% 98% 100%  Weight:      Height:        Intake/Output Summary (Last 24 hours) at 10/09/2018 0855 Last data filed at 10/09/2018 0538 Gross per 24 hour  Intake 826.16 ml  Output 7750 ml  Net -6923.84 ml   Filed Weights   10/06/18 1354 10/07/18 0057  Weight: 124.7 kg 117 kg    Physical Exam    GEN: Well nourished, well developed, in no acute distress.  HEENT: normal.  Neck: Supple, no JVD, carotid bruits, or masses. Cardiac: RRR, no murmurs, rubs, or gallops. No clubbing,  cyanosis, edema.  Radials/DP/PT 2+ and equal bilaterally.  Respiratory:  Respirations regular and unlabored, clear to auscultation bilaterally. GI: Soft, nontender, nondistended, BS + x 4. MS: no deformity or atrophy. Skin: warm and dry, no rash. Neuro:  Strength and sensation are intact. Psych: Normal affect.  Labs    CBC Recent Labs    10/08/18 0535 10/09/18 0543  WBC 11.3* 11.1*  HGB 11.7* 12.2  HCT 41.9 43.9  MCV 76.2* 76.9*  PLT 237 239   Basic Metabolic Panel Recent Labs    16/07/9600/08/20 0535 10/09/18 0543  NA 142 145  K 3.9 3.5  CL 101 98  CO2 32 39*  GLUCOSE 89 101*  BUN 48* 40*  CREATININE 2.90* 2.00*  CALCIUM 8.4* 8.6*   Liver Function Tests Recent Labs    10/06/18 1411  AST 19  ALT 18  ALKPHOS 99  BILITOT 0.9  PROT 6.3*  ALBUMIN 3.2*   No results for input(s): LIPASE, AMYLASE in the last 72 hours. Cardiac Enzymes Recent Labs    10/06/18 1411 10/07/18 0110 10/07/18 0645  TROPONINI 0.05* 0.05* 0.04*   BNP Recent Labs    10/06/18 1411  BNP 1,056.0*   D-Dimer No results for input(s): DDIMER  in the last 72 hours. Hemoglobin A1C No results for input(s): HGBA1C in the last 72 hours. Fasting Lipid Panel No results for input(s): CHOL, HDL, LDLCALC, TRIG, CHOLHDL, LDLDIRECT in the last 72 hours. Thyroid Function Tests Recent Labs    10/06/18 1411  TSH 22.701*    Telemetry    Sinus rhythm  ECG      Radiology    Dg Chest 2 View  Result Date: 10/09/2018 CLINICAL DATA:  Dyspnea EXAM: CHEST - 2 VIEW COMPARISON:  10/06/2018 FINDINGS: Interval decrease in left-sided pleural effusion. Blunting of the posterior costophrenic angles from small bilateral pleural effusions are now identified. Cardiomegaly with interstitial edema and pulmonary vascular redistribution is redemonstrated without significant interval change. Nonaneurysmal atherosclerosis of the aortic arch. Thoracic spondylosis is noted. No acute osseous abnormality is seen. IMPRESSION:  Cardiomegaly with interstitial edema and small bilateral pleural effusions. Interval decrease in left-sided pleural effusion. Electronically Signed   By: Tollie Eth M.D.   On: 10/09/2018 01:28   Dg Chest 2 View  Result Date: 09/12/2018 CLINICAL DATA:  78 year old female with shortness of breath. EXAM: CHEST - 2 VIEW COMPARISON:  Chest radiograph dated 11/08/2017 FINDINGS: There is cardiomegaly with vascular congestion. Diffuse interstitial coarsening and bronchitic changes. Left lung base density may represent combination of a small pleural effusion or atelectasis although infiltrate is not excluded. Clinical correlation is recommended. No pneumothorax. Atherosclerotic calcification of the aortic arch. Osteopenia with degenerative changes of the spine. No acute osseous pathology. IMPRESSION: 1. Cardiomegaly with vascular congestion . 2. Left lung base atelectasis versus infiltrate. Possible small left pleural effusion. Electronically Signed   By: Elgie Collard M.D.   On: 09/12/2018 03:57   [place] Chest (pleural Effusion)  Result Date: 10/07/2018 CLINICAL DATA:  Acute on chronic respiratory failure. Evaluate for pleural fluid and thoracentesis. EXAM: CHEST ULTRASOUND COMPARISON:  Chest radiograph 10/06/2018 FINDINGS: Both sides of the chest were evaluated with ultrasound. A small amount of subpulmonic pleural fluid seen on both sides. Fluid was not amendable for thoracentesis due to overlying lung. IMPRESSION: Small amount of pleural fluid bilaterally. Fluid is not amendable for thoracentesis. Electronically Signed   By: Richarda Overlie M.D.   On: 10/07/2018 10:58   Dg Chest Portable 1 View  Result Date: 10/06/2018 CLINICAL DATA:  Shortness of breath and bradycardia. EXAM: PORTABLE CHEST 1 VIEW COMPARISON:  09/12/2018 FINDINGS: There is a large left pleural effusion which appears increased in volume compared with the previous exam. No significant right effusion identified. Mild diffuse edema identified.  IMPRESSION: 1. Significant increase in volume of left pleural effusion. 2. Pulmonary edema. Electronically Signed   By: Signa Kell M.D.   On: 10/06/2018 14:48    Assessment & Plan    Bradycardia-likely secondary to amiodarone which patient is on for paroxysmal A. fib with rapid ventricular response as an outpatient.  Was significantly bradycardic on presentation with heart rates in the 20s to 30s.  Required atropine.  Amiodarone and Eliquis has been discontinued for the past 48 to 72 hours.  Patient is compliant with medications at home is somewhat unclear although appears to have been taking most of her medicines as scheduled.  Have stopped dopamine today with persistent sinus rhythm in the 70s however patient has had profound bradycardia off of dopamine.  While this may be secondary to amiodarone, she will likely need amiodarone or some AV nodal effective drug to allow better control of her paroxysmal atrial fibrillation.  We will proceed with a dual-chamber pacemaker today  as scheduled.  Would resume amiodarone and Eliquis after this is been installed.  Signed, Darlin PriestlyKenneth A. Promyse Ardito MD 10/09/2018, 8:55 AM  Pager: [phone]

## 2018-10-09 NOTE — Progress Notes (Signed)
   10/09/18 0600  Clinical Encounter Type  Visited With Patient  Visit Type Initial;Follow-up;Spiritual support  Referral From Nurse  Spiritual Encounters  Spiritual Needs Prayer;Emotional;Other (Comment)  Stress Factors  Patient Stress Factors Health changes;Loss;Other (Comment)

## 2018-10-09 NOTE — Plan of Care (Signed)
Pt to OR this afternoon for PPM.  She has been A&O x 4 this AM, and pleasant.  Dopamine off since 0730, HR remained 60-80 all morning.  BP decreased to 140-150 / 60-75.  UOP decreased also, but remained adequate.  Family at bedside and updated on POC.  No pain issues.  Pt ble to dangle on SOB w/ minimal assist.

## 2018-10-09 NOTE — Op Note (Signed)
[facility] Cardiology   10/09/2018                     2:25 PM  PATIENT:  Danielle Munoz    PRE-OPERATIVE DIAGNOSIS:  HEART BLOCK  POST-OPERATIVE DIAGNOSIS:  Same  PROCEDURE:  INSERTION PACEMAKER-DUAL CHAMBER  SURGEON:  Marcina Millard, MD    ANESTHESIA:     PREOPERATIVE INDICATIONS:  Danielle Munoz is a  78 y.o. female with a diagnosis of HEART BLOCK who failed conservative measures and elected for surgical management.    The risks benefits and alternatives were discussed with the patient preoperatively including but not limited to the risks of infection, bleeding, cardiopulmonary complications, the need for revision surgery, among others, and the patient was willing to proceed.   OPERATIVE PROCEDURE: The patient was brought to the operating room in a fasting state.  The left pectoral region was prepped and draped in usual sterile manner.  Anesthesia was obtained 1% lidocaine locally.  A 6 mm incision was performed the left pectoral region.  Access was obtained to the left subclavian vein by fine-needle aspiration.  An MRI compatible lead was positioned in the right ventricular apical septum.  After proper thresholds were obtained the lead was sutured in place.  The lead was connected to a MRI compatible single-chamber rate responsive pacemaker generator ( Med[employer]onic [id number] ).  The pacemaker pocket was irrigated gentamicin solution.  The pacemaker generator was positioned into the pocket and the pocket was closed with 2-0 and 4-0 Vicryl, respectively.  Steri-Strips and pressure dressing were applied.  Postprocedural interrogation revealed appropriate ventricular sensing and pacing thresholds.  There were no periprocedural complications.

## 2018-10-09 NOTE — Treatment Plan (Signed)
Patient was discussed on multidisciplinary rounds prior to pacemaker insertion.  No critical care issues identified.  Pacemaker placed today.  Remain available if needed.

## 2018-10-10 ENCOUNTER — Encounter: Payer: Self-pay | Admitting: Cardiology

## 2018-10-10 LAB — BASIC METABOLIC PANEL
Anion gap: 7 (ref 5–15)
BUN: 28 mg/dL — ABNORMAL HIGH (ref 8–23)
CHLORIDE: 101 mmol/L (ref 98–111)
CO2: 36 mmol/L — ABNORMAL HIGH (ref 22–32)
CREATININE: 1.47 mg/dL — AB (ref 0.44–1.00)
Calcium: 7.9 mg/dL — ABNORMAL LOW (ref 8.9–10.3)
GFR calc Af Amer: 40 mL/min — ABNORMAL LOW (ref >60)
GFR calc non Af Amer: 34 mL/min — ABNORMAL LOW (ref >60)
Glucose, Bld: 104 mg/dL — ABNORMAL HIGH (ref 70–99)
Potassium: 3.5 mmol/L (ref 3.5–5.1)
Sodium: 144 mmol/L (ref 135–145)

## 2018-10-10 MED ORDER — METOPROLOL SUCCINATE ER 50 MG PO TB24: 50.0000 mg | ORAL_TABLET | Freq: Every day | ORAL | 0 refills | Status: DC

## 2018-10-10 MED ORDER — AMIODARONE HCL 200 MG PO TABS: 200.0000 mg | ORAL_TABLET | Freq: Every day | ORAL | 0 refills | Status: DC

## 2018-10-10 MED ORDER — POTASSIUM CHLORIDE CRYS ER 20 MEQ PO TBCR
40.0000 meq | EXTENDED_RELEASE_TABLET | Freq: Once | ORAL | Status: AC
  Administered 2018-10-10: 40 meq via ORAL
  Filled 2018-10-10: qty 2

## 2018-10-10 MED ORDER — APIXABAN 2.5 MG PO TABS: 2.5000 mg | ORAL_TABLET | Freq: Two times a day (BID) | ORAL | Status: DC

## 2018-10-10 MED ORDER — APIXABAN 5 MG PO TABS
5.0000 mg | ORAL_TABLET | Freq: Two times a day (BID) | ORAL | Status: DC
  Administered 2018-10-10: 5 mg via ORAL

## 2018-10-10 MED ORDER — CEPHALEXIN 250 MG PO CAPS: 250.0000 mg | ORAL_CAPSULE | Freq: Three times a day (TID) | ORAL | 0 refills | Status: DC

## 2018-10-10 MED ORDER — METOPROLOL SUCCINATE ER 50 MG PO TB24
50.0000 mg | ORAL_TABLET | Freq: Every day | ORAL | Status: DC
  Administered 2018-10-10: 50 mg via ORAL
  Filled 2018-10-10: qty 1

## 2018-10-10 MED ORDER — LORAZEPAM 0.5 MG PO TABS: 0.2500 mg | ORAL_TABLET | Freq: Two times a day (BID) | ORAL | 0 refills | Status: DC | PRN

## 2018-10-10 MED ORDER — AMIODARONE HCL 200 MG PO TABS
200.0000 mg | ORAL_TABLET | Freq: Every day | ORAL | Status: DC
  Administered 2018-10-10: 200 mg via ORAL
  Filled 2018-10-10: qty 1

## 2018-10-10 NOTE — Progress Notes (Signed)
Pt transferred from ICU.Pt alert. Pt with no immobolizer to left arm. ICU stated to remind the pt not to use her left arm. While in room, reminded pt many times not to use left arm  and that for the time being, that she could not lay on the left side.

## 2018-10-10 NOTE — Care Management Important Message (Signed)
Copy of signed Medicare IM left with patient in room. 

## 2018-10-10 NOTE — Progress Notes (Signed)
Patient Name: Danielle Munoz Date of Encounter: 10/10/2018  Hospital Problem List     Active Problems:   Bradycardia    Patient Profile     S/P vvi back up pacing. Will attempt to maintain nsr with amiodarone and anticoagulate with eliquis at 2.5 bid until renal function improves.   Subjective   Feels better  Inpatient Medications    . amiodarone  200 mg Oral Daily  . apixaban  5 mg Oral BID  . arformoterol  15 mcg Nebulization BID  . atorvastatin  40 mg Oral QHS  . docusate sodium  100 mg Oral BID  . ipratropium  0.5 mg Nebulization Q12H  . levothyroxine  100 mcg Oral Q0600  . mouth rinse  15 mL Mouth Rinse BID  . metoprolol succinate  50 mg Oral Daily  . sodium chloride flush  3 mL Intravenous Q12H  . torsemide  40 mg Oral Daily    Vital Signs    Vitals:   10/09/18 1945 10/09/18 2043 10/10/18 0326 10/10/18 0826  BP:  (!) 132/54 (!) 160/52 (!) 168/75  Pulse:  73 77 78  Resp:      Temp:  97.6 F (36.4 C) 98.3 F (36.8 C) 98.6 F (37 C)  TempSrc:  Oral Oral Oral  SpO2: 96% 93% 96% 98%  Weight:  109.7 kg 109.8 kg   Height:        Intake/Output Summary (Last 24 hours) at 10/10/2018 1054 Last data filed at 10/10/2018 1018 Gross per 24 hour  Intake 1203.27 ml  Output 1275 ml  Net -71.73 ml   Filed Weights   10/07/18 0057 10/09/18 2043 10/10/18 0326  Weight: 117 kg 109.7 kg 109.8 kg    Physical Exam    GEN: Well nourished, well developed, in no acute distress.  HEENT: normal.  Neck: Supple, no JVD, carotid bruits, or masses. Cardiac: RRR, no murmurs, rubs, or gallops. No clubbing, cyanosis, edema.  Radials/DP/PT 2+ and equal bilaterally.  Respiratory:  Respirations regular and unlabored, clear to auscultation bilaterally. GI: Soft, nontender, nondistended, BS + x 4. MS: no deformity or atrophy. Skin: warm and dry, no rash. Neuro:  Strength and sensation are intact. Psych: Normal affect.  Labs    CBC Recent Labs    10/08/18 0535 10/09/18 0543   WBC 11.3* 11.1*  HGB 11.7* 12.2  HCT 41.9 43.9  MCV 76.2* 76.9*  PLT 237 239   Basic Metabolic Panel Recent Labs    31/49/70 0543 10/10/18 0758  NA 145 144  K 3.5 3.5  CL 98 101  CO2 39* 36*  GLUCOSE 101* 104*  BUN 40* 28*  CREATININE 2.00* 1.47*  CALCIUM 8.6* 7.9*   Liver Function Tests No results for input(s): AST, ALT, ALKPHOS, BILITOT, PROT, ALBUMIN in the last 72 hours. No results for input(s): LIPASE, AMYLASE in the last 72 hours. Cardiac Enzymes No results for input(s): CKTOTAL, CKMB, CKMBINDEX, TROPONINI in the last 72 hours. BNP No results for input(s): BNP in the last 72 hours. D-Dimer No results for input(s): DDIMER in the last 72 hours. Hemoglobin A1C No results for input(s): HGBA1C in the last 72 hours. Fasting Lipid Panel No results for input(s): CHOL, HDL, LDLCALC, TRIG, CHOLHDL, LDLDIRECT in the last 72 hours. Thyroid Function Tests No results for input(s): TSH, T4TOTAL, T3FREE, THYROIDAB in the last 72 hours.  Invalid input(s): FREET3  Telemetry    nsr  ECG      Radiology    Dg Chest  2 View  Result Date: 10/09/2018 CLINICAL DATA:  Dyspnea EXAM: CHEST - 2 VIEW COMPARISON:  10/06/2018 FINDINGS: Interval decrease in left-sided pleural effusion. Blunting of the posterior costophrenic angles from small bilateral pleural effusions are now identified. Cardiomegaly with interstitial edema and pulmonary vascular redistribution is redemonstrated without significant interval change. Nonaneurysmal atherosclerosis of the aortic arch. Thoracic spondylosis is noted. No acute osseous abnormality is seen. IMPRESSION: Cardiomegaly with interstitial edema and small bilateral pleural effusions. Interval decrease in left-sided pleural effusion. Electronically Signed   By: Tollie Ethavid  Kwon M.D.   On: 10/09/2018 01:28   Dg Chest 2 View  Result Date: 09/12/2018 CLINICAL DATA:  78 year old female with shortness of breath. EXAM: CHEST - 2 VIEW COMPARISON:  Chest radiograph  dated 11/08/2017 FINDINGS: There is cardiomegaly with vascular congestion. Diffuse interstitial coarsening and bronchitic changes. Left lung base density may represent combination of a small pleural effusion or atelectasis although infiltrate is not excluded. Clinical correlation is recommended. No pneumothorax. Atherosclerotic calcification of the aortic arch. Osteopenia with degenerative changes of the spine. No acute osseous pathology. IMPRESSION: 1. Cardiomegaly with vascular congestion . 2. Left lung base atelectasis versus infiltrate. Possible small left pleural effusion. Electronically Signed   By: Elgie CollardArash  Radparvar M.D.   On: 09/12/2018 03:57   [place] Chest (pleural Effusion)  Result Date: 10/07/2018 CLINICAL DATA:  Acute on chronic respiratory failure. Evaluate for pleural fluid and thoracentesis. EXAM: CHEST ULTRASOUND COMPARISON:  Chest radiograph 10/06/2018 FINDINGS: Both sides of the chest were evaluated with ultrasound. A small amount of subpulmonic pleural fluid seen on both sides. Fluid was not amendable for thoracentesis due to overlying lung. IMPRESSION: Small amount of pleural fluid bilaterally. Fluid is not amendable for thoracentesis. Electronically Signed   By: Richarda OverlieAdam  Henn M.D.   On: 10/07/2018 10:58   Dg Chest Port 1 View  Result Date: 10/09/2018 CLINICAL DATA:  Post pacemaker insertion EXAM: PORTABLE CHEST 1 VIEW COMPARISON:  Chest x-ray of 10/08/2018 FINDINGS: A single lead permanent pacemaker is now present. No complicating features are seen. There is cardiomegaly present with opacity at the left lung base consistent with effusion, pleural thickening, but pneumonia can not be excluded. No evidence of edema is noted. The right lung is clear. No pneumothorax is seen. IMPRESSION: 1. New single lead permanent pacemaker is now present. No complicating features. 2. Stable cardiomegaly.  No edema is seen. 3. No change in opacity at the left lung base. Electronically Signed   By: Dwyane DeePaul  Barry M.D.    On: 10/09/2018 15:16   Dg Chest Portable 1 View  Result Date: 10/06/2018 CLINICAL DATA:  Shortness of breath and bradycardia. EXAM: PORTABLE CHEST 1 VIEW COMPARISON:  09/12/2018 FINDINGS: There is a large left pleural effusion which appears increased in volume compared with the previous exam. No significant right effusion identified. Mild diffuse edema identified. IMPRESSION: 1. Significant increase in volume of left pleural effusion. 2. Pulmonary edema. Electronically Signed   By: Signa Kellaylor  Stroud M.D.   On: 10/06/2018 14:48   Dg C-arm 1-60 Min-no Report  Result Date: 10/09/2018 Fluoroscopy was utilized by the requesting physician.  No radiographic interpretation.    Assessment & Plan    Bradycardia-s/p placement of vvi pacemaker set at back up of 50. Will resume amiodarone at 200 mg dialy and resume eliquis at 2.5 mig bid. Ambulate and consider discharge later today if stable. Follow up in our office in 1-2 weeks.   CKD-renal function improved today.   afib-will resume amiodarone and eliquis. Attempt  to maintain nsr.     Signed, Darlin PriestlyKenneth A. Arayla Kruschke MD 10/10/2018, 10:54 AM  Pager: [phone]

## 2018-10-10 NOTE — Progress Notes (Signed)
Pt to be discharged to [facility] today. Iv and tele removed. Report called to the facility. [facility] to transport.

## 2018-10-10 NOTE — Discharge Instructions (Signed)
Biventricular Pacemaker Implantation, Care After Refer to this sheet in the next few weeks. These instructions provide you with information about caring for yourself after your procedure. Your health care provider may also give you more specific instructions. Your treatment has been planned according to current medical practices, but problems sometimes occur. Call your health care provider if you have any problems or questions after your procedure. What can I expect after the procedure? After the procedure, it is common to have:  Mild pain or soreness in your chest for several days.  A small amount of blood or clear fluid coming from your incision.  A slight bump in your chest where the pulse generator was placed. You may be able to feel the generator under your skin. This is normal. Follow these instructions at home: Medicines  Take over-the-counter and prescription medicines only as told by your health care provider.  Do not take any new medicines without asking your health care provider first.  If you were prescribed an antibiotic medicine, take it as told by your health care provider. Do not stop taking the antibiotic even if you start to feel better. Incision care      Keep your incision area clean and dry.  Follow instructions from your health care provider about how to take care of your incision. Make sure you: ? Wash your hands with soap and water before you change your bandage (dressing). If soap and water are not available, use hand sanitizer. ? Change your dressing as told by your health care provider. ? Leave stitches (sutures), skin glue, or adhesive strips in place. These skin closures may need to stay in place for 2 weeks or longer. If adhesive strip edges start to loosen and curl up, you may trim the loose edges. Do not remove adhesive strips completely unless your health care provider tells you to do that.  Check your incision area every day for signs of infection.  Check for: ? More redness, swelling, or pain. ? More fluid or blood. ? Warmth. ? Pus or a bad smell. Activity  Return to your normal activities as told by your health care provider. Ask your health care provider what activities are safe for you.  Do not lift anything that is heavier than 10 lb (4.5 kg) until your health care provider approves.  Do not lift your upper arms above your shoulders for at least 6 weeks or as long as told by your health care provider. ? If you sleep with your arms above your head, wear an arm restraint while you sleep to prevent this from happening. ? Avoid sudden movements that pull your upper arms far away from your body for at least 6 weeks.  Do a mild form of exercise at least once a day. As you feel better, you may exercise more.  Gently stretch your shoulders at least once a day to help prevent stiffness in your chest. Electricity and Magnetic Fields  Avoid places and objects that have a strong electric or magnetic field. This includes: ? Airport Scientist, forensicsecurity checkpoints. When you are at the airport, tell officials that you have a pacemaker and show them your pacemaker identification card. Officials will check you in safely so that your pacemaker is not damaged. Do not allow magnetic wands to be waved near your pacemaker. That can make the pacemaker stop working. ? Metal detectors. If you must pass through a metal detector, walk through it quickly. Do not stop under the detector or stand near  it. ? Power plants. ? Large electrical generators. ? Radiofrequency transmission towers, such as cell phone and radio towers.  Do not use amateur ("ham") radio equipment or electric ("arc") Optician, dispensing. If you are not sure whether something is safe to use, ask your health care provider. ? Some devices may be safe to use if you hold them at least 1 ft (0.3 m) from your pacemaker. These devices may include power tools, lawn mowers, and speakers.  When you talk on your  cell phone, hold it to your ear that is opposite from the side that your pacemaker is on. Do not leave your cell phone in a pocket over your pacemaker. Long-Term Care  Carry your pacemaker identification card with you at all times, especially when you travel.  Consider wearing a medical alert bracelet or necklace that explains your pacemaker and any heart conditions you have.  Tell all health care providers who care for you that you have a pacemaker. This may prevent you from having an MRI because of the strong magnets used during that test.  Have your pacemaker checked every 3-6 months or as often as told by your health care provider. General instructions  Do not use any tobacco products, such as cigarettes, chewing tobacco, or e-cigarettes. If you need help quitting, ask your health care provider.  Do not drive or operate heavy machinery while taking prescription pain medicine.  Do not take baths, swim, or use a hot tub until your health care provider approves.  Follow instructions from your health care provider about eating or drinking restrictions.  Weigh yourself every day and write down your weight.  Keep all follow-up visits as told by your health care provider. This is important. Contact a health care provider if:  You suddenly gain 3 lb (1.4 kg) or more in 24 hours.  You have swelling in your feet or legs.  You have an irregular heartbeat (palpitations).  You have more redness, swelling, or pain around your incision.  You have more fluid or blood coming from your incision.  Your incision area feels warm to the touch.  You have pus or a bad smell coming from your incision. Get help right away if:  You have chest pain.  You have difficulty breathing.  You suddenly feel light-headed.  You have a fever.  You faint. These symptoms may represent a serious problem that is an emergency. Do not wait to see if the symptoms will go away. Get medical help right away.  Call your local emergency services (911 in the [place] Do not drive yourself to the hospital. This information is not intended to replace advice given to you by your health care provider. Make sure you discuss any questions you have with your health care provider. Document Released: 06/11/2012 Document Revised: 05/05/2018 Document Reviewed: 06/12/2015 Elsevier Interactive Patient Education  2019 [employer]   Bradycardia, Adult Bradycardia is a slower-than-normal heartbeat. A normal resting heart rate for an adult ranges from 60 to 100 beats per minute. With bradycardia, the resting heart rate is less than 60 beats per minute. Bradycardia can prevent enough oxygen from reaching certain areas of your body when you are active. It can be serious if it keeps enough oxygen from reaching your brain and other parts of your body. Bradycardia is not a problem for everyone. For some healthy adults, a slow resting heart rate is normal. What are the causes? This condition may be caused by:  A problem with the heart, including: ?  A problem with the heart's electrical system, such as a heart block. With a heart block, electrical signals between the chambers of the heart are partially or completely blocked, so they are not able to work as they should. ? A problem with the heart's natural pacemaker (sinus node). ? Heart disease. ? A heart attack. ? Heart damage. ? Lyme disease. ? A heart infection. ? A heart condition that is present at birth (congenital heart defect).  Certain medicines that treat heart conditions.  Certain conditions, such as hypothyroidism and obstructive sleep apnea.  Problems with the balance of chemicals and other substances, like potassium, in the blood.  Trauma.  Radiation therapy. What increases the risk? You are more likely to develop this condition if you:  Are age 48 or older.  Have high blood pressure (hypertension), high cholesterol (hyperlipidemia), or  diabetes.  Drink heavily, use tobacco or nicotine products, or use drugs. What are the signs or symptoms? Symptoms of this condition include:  Light-headedness.  Feeling faint or fainting.  Fatigue and weakness.  Trouble with activity or exercise.  Shortness of breath.  Chest pain (angina).  Drowsiness.  Confusion.  Dizziness. How is this diagnosed? This condition may be diagnosed based on:  Your symptoms.  Your medical history.  A physical exam. During the exam, your health care provider will listen to your heartbeat and check your pulse. To confirm the diagnosis, your health care provider may order tests, such as:  Blood tests.  An electrocardiogram (ECG). This test records the heart's electrical activity. The test can show how fast your heart is beating and whether the heartbeat is steady.  A test in which you wear a portable device (event recorder or Holter monitor) to record your heart's electrical activity while you go about your day.  Anexercise test. How is this treated? Treatment for this condition depends on the cause of the condition and how severe your symptoms are. Treatment may involve:  Treatment of the underlying condition.  Changing your medicines or how much medicine you take.  Having a small, battery-operated device called a pacemaker implanted under the skin. When bradycardia occurs, this device can be used to increase your heart rate and help your heart beat in a regular rhythm. Follow these instructions at home: Lifestyle   Manage any health conditions that contribute to bradycardia as told by your health care provider.  Follow a heart-healthy diet. A nutrition specialist (dietitian) can help educate you about healthy food options and changes.  Follow an exercise program that is approved by your health care provider.  Maintain a healthy weight.  Try to reduce or manage your stress, such as with yoga or meditation. If you need help  reducing stress, ask your health care provider.  Do not use any products that contain nicotine or tobacco, such as cigarettes, e-cigarettes, and chewing tobacco. If you need help quitting, ask your health care provider.  Do not use illegal drugs.  Limit alcohol intake to no more than 1 drink a day for nonpregnant women and 2 drinks a day for men. Be aware of how much alcohol is in your drink. In the [place] one drink equals one 12 oz bottle of beer (355 mL), one 5 oz glass of wine (148 mL), or one 1 oz glass of hard liquor (44 mL). General instructions  Take over-the-counter and prescription medicines only as told by your health care provider.  Keep all follow-up visits as told by your health care provider. This is  important. How is this prevented? In some cases, bradycardia may be prevented by:  Treating underlying medical problems.  Stopping behaviors or medicines that can trigger the condition. Contact a health care provider if you:  Feel light-headed or dizzy.  Almost faint.  Feel weak or are easily fatigued during physical activity.  Experience confusion or have memory problems. Get help right away if:  You faint.  You have: ? An irregular heartbeat (palpitations). ? Chest pain. ? Trouble breathing. Summary  Bradycardia is a slower-than-normal heartbeat. With bradycardia, the resting heart rate is less than 60 beats per minute.  Treatment for this condition depends on the cause.  Manage any health conditions that contribute to bradycardia as told by your health care provider.  Do not use any products that contain nicotine or tobacco, such as cigarettes, e-cigarettes, and chewing tobacco, and limit alcohol intake.  Keep all follow-up visits as told by your health care provider. This is important. This information is not intended to replace advice given to you by your health care provider. Make sure you discuss any questions you have with your health care  provider. Document Released: 06/09/2002 Document Revised: 02/26/2018 Document Reviewed: 02/26/2018 Elsevier Interactive Patient Education  2019 [employer].

## 2018-10-10 NOTE — Discharge Summary (Signed)
[facility] [place] [facility]   PATIENT NAME: Danielle Munoz    MR#:  [id number]  DATE OF BIRTH:  1941/09/26  DATE OF ADMISSION:  10/06/2018 ADMITTING PHYSICIAN: Milagros LollSrikar Sudini, MD  DATE OF DISCHARGE: 10/10/2018  PRIMARY CARE PHYSICIAN: [facility], [facility]    ADMISSION DIAGNOSIS:  Symptomatic bradycardia [R00.1]  DISCHARGE DIAGNOSIS:  Active Problems:   Bradycardia   SECONDARY DIAGNOSIS:   Past Medical History:  Diagnosis Date  . Atrial flutter (HCC)    a. s/p TEE/DCCV 10/18 @ [facility] complicated by asystole and junctional rhythm requiring epi, atropine, and CPR x < 1 minute; b. amio and Eliquis; c. CHADS2VASC => 6 (CHF, HTN, age x 2, vascular disease, female)  . Chronic combined systolic and diastolic CHF (congestive heart failure) (HCC)   . CKD (chronic kidney disease), stage II   . COPD (chronic obstructive pulmonary disease) (HCC)   . Hypertension   . Hypothyroidism   . Iron deficiency anemia   . Morbid obesity (HCC)   . Noncompliance     HOSPITAL COURSE:   1.  Severe bradycardia.  The patient was initially admitted to the ICU and stayed there until pacemaker was placed yesterday.  The patient was held off amiodarone and metoprolol until pacemaker was placed.  The patient was actually on dopamine for a little while to maintain heart rate in a good range.  We had a wait for the Eliquis to get out of the system prior to placing the pacemaker.  Keflex upon getting out of the hospital.  Advised her not to lift up her left arm over her head for about 6 weeks. 2.  Acute kidney injury with acute diastolic congestive heart failure.  The acute kidney injury is secondary to poor perfusion and severe bradycardia.  Hold off on ACE inhibitor at this point.  We needed to diuresis with torsemide.  Creatinine peaked at 3.05 and down to 1.47 upon discharge.  Continue torsemide, Toprol.  CHF clinic referral.  Cardiology referral.  Recommend checking a BMP weekly 3.   Paroxysmal atrial fibrillation.  After pacemaker placement to prevent low heart rate we were able to start amiodarone and Toprol at lower doses to prevent fast heart rate. 4.  Hyperlipidemia unspecified on atorvastatin 5.  Hypothyroidism unspecified on levothyroxine 6.  Oxygen at night 2 L for sleep apnea 7.  Morbid obesity with a BMI of 47.26.  Weight loss needed   DISCHARGE CONDITIONS:   Satisfactory  CONSULTS OBTAINED:  Treatment Team:  Alwyn Peaallwood, Dwayne D, MD Dalia HeadingFath, Kenneth A, MD  DRUG ALLERGIES:  No Known Allergies  DISCHARGE MEDICATIONS:   Allergies as of 10/10/2018   No Known Allergies     Medication List    STOP taking these medications   lisinopril 20 MG tablet Commonly known as:  PRINIVIL,ZESTRIL   metoprolol tartrate 50 MG tablet Commonly known as:  LOPRESSOR     TAKE these medications   acetaminophen 325 MG tablet Commonly known as:  TYLENOL Take 650 mg by mouth every 6 (six) hours as needed for mild pain, moderate pain or fever.   albuterol 108 (90 Base) MCG/ACT inhaler Commonly known as:  PROVENTIL HFA;VENTOLIN HFA Inhale 2 puffs into the lungs every 6 (six) hours as needed for wheezing or shortness of breath.   amiodarone 200 MG tablet Commonly known as:  PACERONE Take 1 tablet (200 mg total) by mouth daily. What changed:    when to take this  additional instructions   atorvastatin  40 MG tablet Commonly known as:  LIPITOR Take 40 mg by mouth at bedtime.   cephALEXin 250 MG capsule Commonly known as:  KEFLEX Take 1 capsule (250 mg total) by mouth 3 (three) times daily for 18 doses.   clotrimazole 1 % cream Commonly known as:  LOTRIMIN Apply 1 application topically 2 (two) times daily.   docusate sodium 100 MG capsule Commonly known as:  COLACE Take 200 mg by mouth daily.   ELIQUIS 5 MG Tabs tablet Generic drug:  apixaban Take 5 mg by mouth 2 (two) times daily.   ferrous sulfate 325 (65 FE) MG tablet Take 325 mg by mouth daily with  breakfast.   levothyroxine 100 MCG tablet Commonly known as:  SYNTHROID, LEVOTHROID Take 100 mcg by mouth daily before breakfast.   LORazepam 0.5 MG tablet Commonly known as:  ATIVAN Take 0.5 tablets (0.25 mg total) by mouth 2 (two) times daily as needed for anxiety (agitation).   Melatonin 3 MG Tabs Take 3 mg by mouth at bedtime as needed (sleep).   metoprolol succinate 50 MG 24 hr tablet Commonly known as:  TOPROL-XL Take 1 tablet (50 mg total) by mouth daily. Take with or immediately following a meal.   polyethylene glycol packet Commonly known as:  MIRALAX / GLYCOLAX Take 17 g by mouth daily as needed for mild constipation or moderate constipation.   REFRESH PLUS 0.5 % Soln Generic drug:  Carboxymethylcellulose Sod PF Place 1 drop into both eyes 3 (three) times daily as needed (dryness).   senna-docusate 8.6-50 MG tablet Commonly known as:  Senokot-S Take 2 tablets by mouth 2 (two) times daily as needed for mild constipation or moderate constipation.   STIOLTO RESPIMAT 2.5-2.5 MCG/ACT Aers Generic drug:  Tiotropium Bromide-Olodaterol Inhale 2 puffs into the lungs daily.   torsemide 20 MG tablet Commonly known as:  DEMADEX Take 40 mg by mouth daily.        DISCHARGE INSTRUCTIONS:   Follow-up with Dr. at rehab 1 day Follow-up cardiology 1 week Follow-up CHF clinic  If you experience worsening of your admission symptoms, develop shortness of breath, life threatening emergency, suicidal or homicidal thoughts you must seek medical attention immediately by calling 911 or calling your MD immediately  if symptoms less severe.  You Must read complete instructions/literature along with all the possible adverse reactions/side effects for all the Medicines you take and that have been prescribed to you. Take any new Medicines after you have completely understood and accept all the possible adverse reactions/side effects.   Please note  You were cared for by a hospitalist  during your hospital stay. If you have any questions about your discharge medications or the care you received while you were in the hospital after you are discharged, you can call the unit and asked to speak with the hospitalist on call if the hospitalist that took care of you is not available. Once you are discharged, your primary care physician will handle any further medical issues. Please note that NO REFILLS for any discharge medications will be authorized once you are discharged, as it is imperative that you return to your primary care physician (or establish a relationship with a primary care physician if you do not have one) for your aftercare needs so that they can reassess your need for medications and monitor your lab values.    Today   CHIEF COMPLAINT:   Chief Complaint  Patient presents with  . Bradycardia  . Nausea  . Emesis  HISTORY OF PRESENT ILLNESS:  Danielle Munoz  is a 78 y.o. female came in with bradycardia nausea vomiting   VITAL SIGNS:  Blood pressure (!) 168/75, pulse 78, temperature 98.6 F (37 C), temperature source Oral, resp. rate 12, height 5' (1.524 m), weight 109.8 kg, SpO2 98 %.   PHYSICAL EXAMINATION:  GENERAL:  78 y.o.-year-old patient lying in the bed with no acute distress.  EYES: Pupils equal, round, reactive to light and accommodation. No scleral icterus. Extraocular muscles intact.  HEENT: Head atraumatic, normocephalic. Oropharynx and nasopharynx clear.  NECK:  Supple, no jugular venous distention. No thyroid enlargement, no tenderness.  LUNGS: Decreased breath sounds bilateral bases, no wheezing, rales,rhonchi or crepitation. No use of accessory muscles of respiration.  CARDIOVASCULAR: S1, S2 normal. No murmurs, rubs, or gallops.  ABDOMEN: Soft, non-tender, non-distended. Bowel sounds present. No organomegaly or mass.  EXTREMITIES: 3+ pedal edema, no cyanosis, or clubbing.  NEUROLOGIC: Cranial nerves II through XII are intact. Muscle strength  5/5 in all extremities. Sensation intact. Gait not checked.  PSYCHIATRIC: The patient is alert and oriented x 3.  SKIN: Chronic lower extremity discoloration  DATA REVIEW:   CBC Recent Labs  Lab 10/09/18 0543  WBC 11.1*  HGB 12.2  HCT 43.9  PLT 239    Chemistries  Recent Labs  Lab 10/06/18 1411  10/10/18 0758  NA 141   < > 144  K 4.1   < > 3.5  CL 101   < > 101  CO2 30   < > 36*  GLUCOSE 116*   < > 104*  BUN 41*   < > 28*  CREATININE 2.68*   < > 1.47*  CALCIUM 8.7*   < > 7.9*  AST 19  --   --   ALT 18  --   --   ALKPHOS 99  --   --   BILITOT 0.9  --   --    < > = values in this interval not displayed.    Cardiac Enzymes Recent Labs  Lab 10/07/18 0645  TROPONINI 0.04*    Microbiology Results  Results for orders placed or performed during the hospital encounter of 10/06/18  MRSA PCR Screening     Status: None   Collection Time: 10/07/18 12:56 AM  Result Value Ref Range Status   MRSA by PCR NEGATIVE NEGATIVE Final    Comment:        The GeneXpert MRSA Assay (FDA approved for NASAL specimens only), is one component of a comprehensive MRSA colonization surveillance program. It is not intended to diagnose MRSA infection nor to guide or monitor treatment for MRSA infections. Performed at [facility], [address] [place], [place] [address]     RADIOLOGY:  Dg Chest 2 View  Result Date: 10/09/2018 CLINICAL DATA:  Dyspnea EXAM: CHEST - 2 VIEW COMPARISON:  10/06/2018 FINDINGS: Interval decrease in left-sided pleural effusion. Blunting of the posterior costophrenic angles from small bilateral pleural effusions are now identified. Cardiomegaly with interstitial edema and pulmonary vascular redistribution is redemonstrated without significant interval change. Nonaneurysmal atherosclerosis of the aortic arch. Thoracic spondylosis is noted. No acute osseous abnormality is seen. IMPRESSION: Cardiomegaly with interstitial edema and small bilateral pleural  effusions. Interval decrease in left-sided pleural effusion. Electronically Signed   By: Tollie Eth M.D.   On: 10/09/2018 01:28   Dg Chest Port 1 View  Result Date: 10/09/2018 CLINICAL DATA:  Post pacemaker insertion EXAM: PORTABLE CHEST 1 VIEW COMPARISON:  Chest x-ray  of 10/08/2018 FINDINGS: A single lead permanent pacemaker is now present. No complicating features are seen. There is cardiomegaly present with opacity at the left lung base consistent with effusion, pleural thickening, but pneumonia can not be excluded. No evidence of edema is noted. The right lung is clear. No pneumothorax is seen. IMPRESSION: 1. New single lead permanent pacemaker is now present. No complicating features. 2. Stable cardiomegaly.  No edema is seen. 3. No change in opacity at the left lung base. Electronically Signed   By: Dwyane Dee M.D.   On: 10/09/2018 15:16   Dg C-arm 1-60 Min-no Report  Result Date: 10/09/2018 Fluoroscopy was utilized by the requesting physician.  No radiographic interpretation.     Management plans discussed with the patient, family and they are in agreement.  CODE STATUS:     Code Status Orders  (From admission, onward)         Start     Ordered   10/06/18 1544  Full code  Continuous     10/06/18 1547        Code Status History    Date Active Date Inactive Code Status Order ID Comments User Context   09/12/2018 0556 09/15/2018 2306 Full Code [id number]  Arnaldo Natal, MD Inpatient   11/08/2017 0102 11/11/2017 2121 Full Code [id number]  Oralia Manis, MD ED      TOTAL TIME TAKING CARE OF THIS PATIENT: 35 minutes.    Alford Highland M.D on 10/10/2018 at 9:38 AM  Between 7am to 6pm - Pager - [phone]  After 6pm go to [email or web address] - Social research officer, government  [facility]  [phone]  CC: Primary care physician; [facility]

## 2018-10-10 NOTE — Progress Notes (Signed)
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 PMH of CKD II, COPD, HTN, obesity, CHF.  EF 50 - 55% on echo performed on 10/07/2018.    Rounded on patient.  Patient was at [facility] for short term rehab prior to admission.  Patient sitting up on side of bed.  Upon entering the room and talking with patient about Heart Failure, patient refused "Living Better with Heart Failure" packet.  Patient stated she would only look at one sheet, but not an entire booklet.  Patient given Heart Failure Zone magnet.  Reviewed the heart failure zones with patient.  Explained the importance of weighing every morning and assessing weight and symptoms.    [facility] Failure [facility] Appt Scheduled per Dr. Cristine Polio request.    *Reviewed importance of and reason behind checking weight daily in the AM, after using the bathroom, but before getting dressed. Patient has scales, but is uncertain if they are working properly.   ? *Reviewed with patient the following information: *Discussed when to call the Dr= weight gain of >2-3lb overnight of 5lb in a week,  *Discussed yellow zone= call MD: weight gain of >2-3lb overnight of 5lb in a week, increased swelling, increased SOB when lying down, chest discomfort, dizziness, increased fatigue *Red Zone= call 911: struggle to breath, fainting or near fainting, significant chest pain   *Diet - Reviewed low sodium diet-provided handout of recommended and not recommended foods.?  ? *Discussed fluid intake with patient as well. Patient not currently on a fluid restriction, but advised no more than 64 ounces of fluids per day.? ? *Instructed patient to take medications as prescribed for heart failure. Explained briefly why pt is on the medications (either make you feel better, live longer or keep you out of the hospital) and discussed monitoring and side effects.  ? *Discussed  activity.  Patient is being discharged back to [facility] for short term rehab.    ? *Smoking Cessation- Patient is a former smoker.? ? *[facility] Failure [facility] - Explained the purpose of the HF Clinic. ?Explained to patient the HF Clinic does not replace PCP nor Cardiologist, but is an additional resource to helping patient manage heart failure at home. Patient had missed previous appt.  Patient has new patient appointment on 10/02/2018 at 2:40 p.m.   ? Patient thanked me for providing the above information. ? ? Army Melia, RN, BSN, [facility]? [facility]  [facility] Cardiac &?Pulmonary Rehab  Cardiovascular &?Pulmonary Nurse Navigator  Direct Line: [phone]  Department Phone #: [phone] Fax: [phone]? Email Address: [email or web address]

## 2018-10-10 NOTE — Evaluation (Signed)
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 PMH of CKD II, COPD, HTN, obesity, CHF    Clinical Impression  Patient sitting EOB at start of session, oriented to self and location (knew she was in the hospital), difficult to determine current cognitive status without family at bedside to confirm. Pt reported that she had been at [facility] for a few days, and lives at home with her grandson, per chart review pt resides at [facility].   Pt sitting without [place] in place, spO2 reading mid 70s, placed on 2L and then 3L, spO2>90%. Remained on [place] at 3L for mobility, returned to 2L at rest, spO2 stable. RN notified pt non-compliant with oxygen. PT and pt reviewed LUE precautions, pt able to teach back. The patient demonstrated transfers with increased effort, CGA and PT to stabilize RW, ambulated ~72ft in room with RW. Exhibited wide base of support, inproper use of RW, and shuffling step, as well as fatigue/mild SOB at end of mobility. Overall the pt demonstrated deficits (see "PT Problem List") that impede pt's ability to perform functional activities and would benefit from skilled PT intervention. Recommendation is STR due to current level of assist/mobility status.    Follow Up Recommendations SNF    Equipment Recommendations  Other (comment);None recommended by PT(TBD at next venue of care)    Recommendations for Other Services       Precautions / Restrictions Precautions Precaution Comments: Pt with pacemaker precautions, per chart, not allowed to raise LUE overhead Restrictions Weight Bearing Restrictions: No      Mobility  Bed Mobility Overal bed mobility: Needs Assistance Bed Mobility: Sit to Supine       Sit to supine: Min assist   General bed mobility comments: LE  management  Transfers Overall transfer level: Needs assistance   Transfers: Sit to/from Stand Sit to Stand: Min guard         General transfer comment: Use of UE on RW. Needed to gain momentum to achieve transfer  Ambulation/Gait Ambulation/Gait assistance: Min guard Gait Distance (Feet): 10 Feet Assistive device: Rolling walker (2 wheeled) Gait Pattern/deviations: Wide base of support     General Gait Details: Ambulated with RW outside BOS, wide BOS noted, shuffling step, decreased gait speed  Stairs            Wheelchair Mobility    Modified Rankin (Stroke Patients Only)       Balance Overall balance assessment: Needs assistance Sitting-balance support: Feet supported Sitting balance-Leahy Scale: Good       Standing balance-Leahy Scale: Fair                               Pertinent Vitals/Pain Pain Assessment: No/denies pain    Home Living Family/patient expects to be discharged to:: (Pt confused, states that she lives at home with her grandson, only been at [facility] for 1-2 days. )                      Prior Function                 Hand Dominance   Dominant Hand: Right    Extremity/Trunk Assessment   Upper Extremity Assessment Upper Extremity Assessment: RUE deficits/detail;LUE deficits/detail RUE Deficits / Details: overall WFLs  for tasked assessed LUE Deficits / Details: LUE not assessed due to pacemaker precautions LUE: Unable to fully assess due to immobilization            Communication   Communication: No difficulties  Cognition Arousal/Alertness: Awake/alert Behavior During Therapy: WFL for tasks assessed/performed Overall Cognitive Status: Within Functional Limits for tasks assessed                                 General Comments: oriented to self and place      General Comments      Exercises     Assessment/Plan    PT Assessment Patient needs continued PT services  PT  Problem List Decreased strength;Cardiopulmonary status limiting activity;Decreased activity tolerance;Decreased knowledge of use of DME;Decreased balance;Decreased safety awareness;Obesity;Decreased mobility;Decreased knowledge of precautions       PT Treatment Interventions DME instruction;Therapeutic exercise;Gait training;Balance training;Stair training;Neuromuscular re-education;Functional mobility training;Therapeutic activities;Patient/family education    PT Goals (Current goals can be found in the Care Plan section)  Acute Rehab PT Goals Patient Stated Goal: Pt wants to get her strength back PT Goal Formulation: With patient Time For Goal Achievement: 10/24/18 Potential to Achieve Goals: Good    Frequency Min 2X/week   Barriers to discharge        Co-evaluation               AM-PAC PT "6 Clicks" Mobility  Outcome Measure Help needed turning from your back to your side while in a flat bed without using bedrails?: A Little Help needed moving from lying on your back to sitting on the side of a flat bed without using bedrails?: A Little Help needed moving to and from a bed to a chair (including a wheelchair)?: A Little Help needed standing up from a chair using your arms (e.g., wheelchair or bedside chair)?: A Little Help needed to walk in hospital room?: A Little Help needed climbing 3-5 steps with a railing? : Total 6 Click Score: 16    End of Session Equipment Utilized During Treatment: Gait belt;Oxygen;Other (comment)(3L during mobility) Activity Tolerance: Patient tolerated treatment well Patient left: in bed;with call bell/phone within reach;with bed alarm set Nurse Communication: Mobility status;Other (comment)(oxygen status at rest and during mobility) PT Visit Diagnosis: Other abnormalities of gait and mobility (R26.89);Muscle weakness (generalized) (M62.81);Difficulty in walking, not elsewhere classified (R26.2)    Time: 2248-2500 PT Time Calculation (min)  (ACUTE ONLY): 23 min   Charges:   PT Evaluation $PT Eval Moderate Complexity: 1 Mod PT Treatments $Therapeutic Activity: 8-22 mins       Olga Coaster PT, DPT 12:22 PM,10/10/18 [phone]

## 2018-10-10 NOTE — NC FL2 (Signed)
[place] MEDICAID FL2 LEVEL OF CARE SCREENING TOOL     IDENTIFICATION  Patient Name: Danielle Munoz Birthdate: [birth year] Sex: female Admission Date (Current Location): 10/06/2018  [place] and [place] Number:  Chiropodist and Address:  [facility], [address], [place], [place] [address]      Provider Number: [id number]  Attending Physician Name and Address:  Alford Highland, MD  Relative Name and Phone Number:       Current Level of Care: Hospital Recommended Level of Care: Skilled Nursing Facility Prior Approval Number:    Date Approved/Denied:   PASRR Number: [id number])  Discharge Plan: SNF    Current Diagnoses: Patient Active Problem List   Diagnosis Date Noted  . Bradycardia 10/06/2018  . Sepsis (HCC) 09/12/2018  . Acute on chronic diastolic heart failure (HCC)   . Acute on chronic systolic CHF (congestive heart failure) (HCC) 11/07/2017  . Hypothyroidism 11/07/2017  . HTN (hypertension) 11/07/2017  . COPD (chronic obstructive pulmonary disease) (HCC) 11/07/2017    Orientation RESPIRATION BLADDER Height & Weight     Self, Time, Situation, Place  O2(3 Liters Oxygen. ) Continent Weight: 242 lb (109.8 kg) Height:  5' (152.4 cm)  BEHAVIORAL SYMPTOMS/MOOD NEUROLOGICAL BOWEL NUTRITION STATUS      Continent Diet(Diet: Heart Healthy )  AMBULATORY STATUS COMMUNICATION OF NEEDS Skin   Extensive Assist Verbally Surgical wounds(Incision: Chest for pacemaker placement. )                       Personal Care Assistance Level of Assistance  Bathing, Feeding, Dressing Bathing Assistance: Limited assistance Feeding assistance: Independent Dressing Assistance: Limited assistance     Functional Limitations Info  Sight, Hearing, Speech Sight Info: Adequate Hearing Info: Adequate Speech Info: Adequate    SPECIAL CARE FACTORS FREQUENCY  PT (By licensed PT), OT (By licensed OT)     PT Frequency: (5) OT Frequency: (5)             Contractures      Additional Factors Info  Code Status, Allergies Code Status Info: (Full Code. ) Allergies Info: (No Known Allergies. )           Current Medications (10/10/2018):  This is the current hospital active medication list Current Facility-Administered Medications  Medication Dose Route Frequency Provider Last Rate Last Dose  . acetaminophen (TYLENOL) tablet 650 mg  650 mg Oral Q6H PRN Milagros Loll, MD   650 mg at 10/08/18 2130   Or  . acetaminophen (TYLENOL) suppository 650 mg  650 mg Rectal Q6H PRN Milagros Loll, MD      . acetaminophen (TYLENOL) tablet 325-650 mg  325-650 mg Oral Q4H PRN Paraschos, Alexander, MD      . albuterol (PROVENTIL) (2.5 MG/3ML) 0.083% nebulizer solution 2.5 mg  2.5 mg Nebulization Q2H PRN Sudini, Srikar, MD      . amiodarone (PACERONE) tablet 200 mg  200 mg Oral Daily Dalia Heading, MD      . apixaban (ELIQUIS) tablet 2.5 mg  2.5 mg Oral BID Dalia Heading, MD      . arformoterol (BROVANA) nebulizer solution 15 mcg  15 mcg Nebulization BID Salena Saner, MD   15 mcg at 10/10/18 0757  . atorvastatin (LIPITOR) tablet 40 mg  40 mg Oral QHS Milagros Loll, MD   40 mg at 10/09/18 2256  . atropine 1 MG/10ML injection 0.4 mg  0.4 mg Intravenous PRN Milagros Loll, MD   0.4  mg at 10/07/18 1228  . ceFAZolin (ANCEF) IVPB 1 g/50 mL premix  1 g Intravenous Q6H Paraschos, Alexander, MD 100 mL/hr at 10/10/18 0920 1 g at 10/10/18 0920  . docusate sodium (COLACE) capsule 100 mg  100 mg Oral BID Sudini, Srikar, MD      . ipratropium (ATROVENT) nebulizer solution 0.5 mg  0.5 mg Nebulization Q12H Salena Saner, MD   0.5 mg at 10/10/18 0757  . levothyroxine (SYNTHROID, LEVOTHROID) tablet 100 mcg  100 mcg Oral Q0600 Salena Saner, MD   100 mcg at 10/10/18 0603  . LORazepam (ATIVAN) tablet 0.25 mg  0.25 mg Oral BID PRN Milagros Loll, MD      . MEDLINE mouth rinse  15 mL Mouth Rinse BID Salena Saner, MD      . Melatonin TABS  2.5 mg  2.5 mg Oral QHS PRN Milagros Loll, MD   2.5 mg at 10/08/18 2336  . ondansetron (ZOFRAN) tablet 4 mg  4 mg Oral Q6H PRN Milagros Loll, MD       Or  . ondansetron (ZOFRAN) injection 4 mg  4 mg Intravenous Q6H PRN Milagros Loll, MD   4 mg at 10/09/18 0028  . ondansetron (ZOFRAN) injection 4 mg  4 mg Intravenous Q6H PRN Paraschos, Alexander, MD      . polyethylene glycol (MIRALAX / GLYCOLAX) packet 17 g  17 g Oral Daily PRN Sudini, Srikar, MD      . sodium chloride flush (NS) 0.9 % injection 3 mL  3 mL Intravenous Q12H Milagros Loll, MD   3 mL at 10/09/18 0901  . torsemide (DEMADEX) tablet 40 mg  40 mg Oral Daily Milagros Loll, MD   40 mg at 10/08/18 1062     Discharge Medications: Please see discharge summary for a list of discharge medications.  Relevant Imaging Results:  Relevant Lab Results:   Additional Information (SSN: [id number])  Shamere Campas, Darleen Crocker, LCSW

## 2018-10-10 NOTE — Clinical Social Work Note (Signed)
Clinical Social Work Assessment  Patient Details  Name: Danielle Munoz MRN: [id number] Date of Birth: [birth year]  Date of referral:  10/10/18               Reason for consult:  Other (Comment Required)(From [facility] )                Permission sought to share information with:  Facility Art therapist granted to share information::  Yes, Verbal Permission Granted  Name::      [facility]   Agency::     Relationship::     Contact Information:     Housing/Transportation Living arrangements for the past 2 months:  [place] of Information:  Patient, Adult Children, Facility Patient Interpreter Needed:  None Criminal Activity/Legal Involvement Pertinent to Current Situation/Hospitalization:  No - Comment as needed Significant Relationships:  Adult Children Lives with:  Facility Resident Do you feel safe going back to the place where you live?  Yes Need for family participation in patient care:  Yes (Comment)  Care giving concerns:  Patient is a short term rehab resident at [facility].    Social Worker assessment / plan:  Holiday representative ([place]) reviewed chart and noted that patient is from [place]. Per [facility] admissions coordinator at [facility] patient is a short term rehab resident and may transition to long term care at [facility]. Per Liliane Channel patient can return to [facility] when medically stable. CSW met with patient and made her aware of above. Patient is agreeable to D/C back to [facility].   Patient is medically stable for D/C to [facility] today. Per [facility] admissions coordinator at [facility] patient can come to room B-2 today. RN will call report and arrange EMS for transport. CSW sent D/C orders to [facility] via [place]. Patient is aware of above. CSW contacted patient's daughter Butch Penny and made her aware of above. Please reconsult if future social work needs arise. CSW signing off.   Employment status:  Retired Forensic scientist:   Medicare PT Recommendations:  [place] / Referral to community resources:  [place]  Patient/Family's Response to care:  Patient is agreeable to D/C to [facility].   Patient/Family's Understanding of and Emotional Response to Diagnosis, Current Treatment, and Prognosis:  Patient and her daughter were very pleasant and thanked CSW for assistance.   Emotional Assessment Appearance:  Appears stated age Attitude/Demeanor/Rapport:    Affect (typically observed):  Accepting, Adaptable, Pleasant Orientation:  Oriented to Self, Oriented to Place, Oriented to  Time, Oriented to Situation Alcohol / Substance use:  Not Applicable Psych involvement (Current and /or in the community):  No (Comment)  Discharge Needs  Concerns to be addressed:  Discharge Planning Concerns Readmission within the last 30 days:  No Current discharge risk:  None Barriers to Discharge:  No Barriers Identified   Oktober Glazer, Veronia Beets, LCSW 10/10/2018, 2:26 PM

## 2018-10-16 ENCOUNTER — Encounter: Payer: Self-pay | Admitting: Emergency Medicine

## 2018-10-16 ENCOUNTER — Inpatient Hospital Stay: Payer: Medicare Other

## 2018-10-16 ENCOUNTER — Emergency Department: Payer: Medicare Other

## 2018-10-16 ENCOUNTER — Inpatient Hospital Stay
Admission: EM | Admit: 2018-10-16 | Discharge: 2018-10-22 | DRG: 871 | Disposition: A | Discharge status: Skilled Nursing Facility | Payer: Medicare Other | Source: Skilled Nursing Facility | Attending: Internal Medicine | Admitting: Internal Medicine

## 2018-10-16 ENCOUNTER — Other Ambulatory Visit: Payer: Self-pay

## 2018-10-16 DIAGNOSIS — R6520 Severe sepsis without septic shock: Secondary | ICD-10-CM

## 2018-10-16 DIAGNOSIS — I429 Cardiomyopathy, unspecified: Secondary | ICD-10-CM | POA: Diagnosis not present

## 2018-10-16 DIAGNOSIS — I130 Hypertensive heart and chronic kidney disease with heart failure and stage 1 through stage 4 chronic kidney disease, or unspecified chronic kidney disease: Secondary | ICD-10-CM | POA: Diagnosis present

## 2018-10-16 DIAGNOSIS — Z66 Do not resuscitate: Secondary | ICD-10-CM | POA: Diagnosis not present

## 2018-10-16 DIAGNOSIS — D631 Anemia in chronic kidney disease: Secondary | ICD-10-CM | POA: Diagnosis present

## 2018-10-16 DIAGNOSIS — Y95 Nosocomial condition: Secondary | ICD-10-CM | POA: Diagnosis present

## 2018-10-16 DIAGNOSIS — J9601 Acute respiratory failure with hypoxia: Secondary | ICD-10-CM

## 2018-10-16 DIAGNOSIS — Z803 Family history of malignant neoplasm of breast: Secondary | ICD-10-CM | POA: Diagnosis not present

## 2018-10-16 DIAGNOSIS — A419 Sepsis, unspecified organism: Principal | ICD-10-CM | POA: Diagnosis present

## 2018-10-16 DIAGNOSIS — Z833 Family history of diabetes mellitus: Secondary | ICD-10-CM | POA: Diagnosis not present

## 2018-10-16 DIAGNOSIS — Z8249 Family history of ischemic heart disease and other diseases of the circulatory system: Secondary | ICD-10-CM

## 2018-10-16 DIAGNOSIS — E039 Hypothyroidism, unspecified: Secondary | ICD-10-CM | POA: Diagnosis present

## 2018-10-16 DIAGNOSIS — Z515 Encounter for palliative care: Secondary | ICD-10-CM

## 2018-10-16 DIAGNOSIS — Z6841 Body Mass Index (BMI) 40.0 and over, adult: Secondary | ICD-10-CM | POA: Diagnosis not present

## 2018-10-16 DIAGNOSIS — Z0189 Encounter for other specified special examinations: Secondary | ICD-10-CM

## 2018-10-16 DIAGNOSIS — D509 Iron deficiency anemia, unspecified: Secondary | ICD-10-CM | POA: Diagnosis present

## 2018-10-16 DIAGNOSIS — J441 Chronic obstructive pulmonary disease with (acute) exacerbation: Secondary | ICD-10-CM | POA: Diagnosis present

## 2018-10-16 DIAGNOSIS — J9600 Acute respiratory failure, unspecified whether with hypoxia or hypercapnia: Secondary | ICD-10-CM | POA: Diagnosis present

## 2018-10-16 DIAGNOSIS — I248 Other forms of acute ischemic heart disease: Secondary | ICD-10-CM | POA: Diagnosis present

## 2018-10-16 DIAGNOSIS — Z9911 Dependence on respirator [ventilator] status: Secondary | ICD-10-CM

## 2018-10-16 DIAGNOSIS — R680 Hypothermia, not associated with low environmental temperature: Secondary | ICD-10-CM | POA: Diagnosis present

## 2018-10-16 DIAGNOSIS — I5043 Acute on chronic combined systolic (congestive) and diastolic (congestive) heart failure: Secondary | ICD-10-CM | POA: Diagnosis present

## 2018-10-16 DIAGNOSIS — J189 Pneumonia, unspecified organism: Secondary | ICD-10-CM

## 2018-10-16 DIAGNOSIS — N179 Acute kidney failure, unspecified: Secondary | ICD-10-CM | POA: Diagnosis present

## 2018-10-16 DIAGNOSIS — J9621 Acute and chronic respiratory failure with hypoxia: Secondary | ICD-10-CM | POA: Diagnosis present

## 2018-10-16 DIAGNOSIS — J9622 Acute and chronic respiratory failure with hypercapnia: Secondary | ICD-10-CM | POA: Diagnosis present

## 2018-10-16 DIAGNOSIS — E870 Hyperosmolality and hypernatremia: Secondary | ICD-10-CM | POA: Diagnosis not present

## 2018-10-16 DIAGNOSIS — I495 Sick sinus syndrome: Secondary | ICD-10-CM | POA: Diagnosis present

## 2018-10-16 DIAGNOSIS — I480 Paroxysmal atrial fibrillation: Secondary | ICD-10-CM | POA: Diagnosis present

## 2018-10-16 DIAGNOSIS — N183 Chronic kidney disease, stage 3 (moderate): Secondary | ICD-10-CM

## 2018-10-16 DIAGNOSIS — R410 Disorientation, unspecified: Secondary | ICD-10-CM | POA: Diagnosis not present

## 2018-10-16 DIAGNOSIS — G9340 Encephalopathy, unspecified: Secondary | ICD-10-CM | POA: Diagnosis not present

## 2018-10-16 DIAGNOSIS — Z950 Presence of cardiac pacemaker: Secondary | ICD-10-CM | POA: Diagnosis not present

## 2018-10-16 DIAGNOSIS — J690 Pneumonitis due to inhalation of food and vomit: Secondary | ICD-10-CM | POA: Diagnosis present

## 2018-10-16 DIAGNOSIS — I482 Chronic atrial fibrillation, unspecified: Secondary | ICD-10-CM | POA: Diagnosis present

## 2018-10-16 DIAGNOSIS — J9690 Respiratory failure, unspecified, unspecified whether with hypoxia or hypercapnia: Secondary | ICD-10-CM

## 2018-10-16 DIAGNOSIS — I4892 Unspecified atrial flutter: Secondary | ICD-10-CM | POA: Diagnosis present

## 2018-10-16 DIAGNOSIS — Z7189 Other specified counseling: Secondary | ICD-10-CM

## 2018-10-16 DIAGNOSIS — Z79899 Other long term (current) drug therapy: Secondary | ICD-10-CM

## 2018-10-16 DIAGNOSIS — Z8673 Personal history of transient ischemic attack (TIA), and cerebral infarction without residual deficits: Secondary | ICD-10-CM

## 2018-10-16 DIAGNOSIS — Z79890 Hormone replacement therapy (postmenopausal): Secondary | ICD-10-CM

## 2018-10-16 DIAGNOSIS — E782 Mixed hyperlipidemia: Secondary | ICD-10-CM | POA: Diagnosis present

## 2018-10-16 DIAGNOSIS — Z8674 Personal history of sudden cardiac arrest: Secondary | ICD-10-CM

## 2018-10-16 DIAGNOSIS — R739 Hyperglycemia, unspecified: Secondary | ICD-10-CM | POA: Diagnosis present

## 2018-10-16 DIAGNOSIS — Z87891 Personal history of nicotine dependence: Secondary | ICD-10-CM

## 2018-10-16 DIAGNOSIS — R5381 Other malaise: Secondary | ICD-10-CM | POA: Diagnosis present

## 2018-10-16 DIAGNOSIS — E876 Hypokalemia: Secondary | ICD-10-CM | POA: Diagnosis present

## 2018-10-16 DIAGNOSIS — J810 Acute pulmonary edema: Secondary | ICD-10-CM | POA: Diagnosis not present

## 2018-10-16 DIAGNOSIS — G9341 Metabolic encephalopathy: Secondary | ICD-10-CM | POA: Diagnosis present

## 2018-10-16 DIAGNOSIS — N182 Chronic kidney disease, stage 2 (mild): Secondary | ICD-10-CM | POA: Diagnosis present

## 2018-10-16 DIAGNOSIS — Z7901 Long term (current) use of anticoagulants: Secondary | ICD-10-CM

## 2018-10-16 LAB — BLOOD GAS, VENOUS
Acid-base deficit: 14.4 mmol/L — ABNORMAL HIGH (ref 0.0–2.0)
Bicarbonate: 14.3 mmol/L — ABNORMAL LOW (ref 20.0–28.0)
O2 Saturation: 69.3 %
Patient temperature: 37
pCO2, Ven: 43 mmHg — ABNORMAL LOW (ref 44.0–60.0)
pH, Ven: 7.13 — CL (ref 7.250–7.430)
pO2, Ven: 49 mmHg — ABNORMAL HIGH (ref 32.0–45.0)

## 2018-10-16 LAB — TROPONIN I
Troponin I: 0.04 ng/mL (ref <0.03)
Troponin I: 0.03 ng/mL (ref <0.03)
Troponin I: 0.04 ng/mL (ref <0.03)

## 2018-10-16 LAB — COMPREHENSIVE METABOLIC PANEL
ALT: 10 U/L (ref 0–44)
AST: 21 U/L (ref 15–41)
Albumin: 3.8 g/dL (ref 3.5–5.0)
Alkaline Phosphatase: 94 U/L (ref 38–126)
Anion gap: 5 (ref 5–15)
BUN: 31 mg/dL — ABNORMAL HIGH (ref 8–23)
CO2: 38 mmol/L — ABNORMAL HIGH (ref 22–32)
Calcium: 8.7 mg/dL — ABNORMAL LOW (ref 8.9–10.3)
Chloride: 101 mmol/L (ref 98–111)
Creatinine, Ser: 1.65 mg/dL — ABNORMAL HIGH (ref 0.44–1.00)
GFR calc Af Amer: 35 mL/min — ABNORMAL LOW (ref >60)
GFR calc non Af Amer: 30 mL/min — ABNORMAL LOW (ref >60)
Glucose, Bld: 150 mg/dL — ABNORMAL HIGH (ref 70–99)
Potassium: 4.5 mmol/L (ref 3.5–5.1)
SODIUM: 144 mmol/L (ref 135–145)
Total Bilirubin: 0.8 mg/dL (ref 0.3–1.2)
Total Protein: 7.4 g/dL (ref 6.5–8.1)

## 2018-10-16 LAB — BLOOD GAS, ARTERIAL
Acid-Base Excess: 13.3 mmol/L — ABNORMAL HIGH (ref 0.0–2.0)
Bicarbonate: 38.3 mmol/L — ABNORMAL HIGH (ref 20.0–28.0)
FIO2: 0.6
MECHVT: 450 mL
Mechanical Rate: 15
O2 Saturation: 98.4 %
PEEP: 5 cmH2O
PO2 ART: 102 mmHg (ref 83.0–108.0)
Patient temperature: 37
pCO2 arterial: 48 mmHg (ref 32.0–48.0)
pH, Arterial: 7.51 — ABNORMAL HIGH (ref 7.350–7.450)

## 2018-10-16 LAB — CBC WITH DIFFERENTIAL/PLATELET
Abs Immature Granulocytes: 0.21 10*3/uL — ABNORMAL HIGH (ref 0.00–0.07)
BASOS PCT: 0 %
Basophils Absolute: 0 10*3/uL (ref 0.0–0.1)
Eosinophils Absolute: 0 10*3/uL (ref 0.0–0.5)
Eosinophils Relative: 0 %
HCT: 50.1 % — ABNORMAL HIGH (ref 36.0–46.0)
Hemoglobin: 13.2 g/dL (ref 12.0–15.0)
Immature Granulocytes: 1 %
Lymphocytes Relative: 2 %
Lymphs Abs: 0.6 10*3/uL — ABNORMAL LOW (ref 0.7–4.0)
MCH: 21.7 pg — ABNORMAL LOW (ref 26.0–34.0)
MCHC: 26.3 g/dL — ABNORMAL LOW (ref 30.0–36.0)
MCV: 82.4 fL (ref 80.0–100.0)
Monocytes Absolute: 1.1 10*3/uL — ABNORMAL HIGH (ref 0.1–1.0)
Monocytes Relative: 4 %
Neutro Abs: 21.8 10*3/uL — ABNORMAL HIGH (ref 1.7–7.7)
Neutrophils Relative %: 93 %
PLATELETS: 275 10*3/uL (ref 150–400)
RBC: 6.08 MIL/uL — ABNORMAL HIGH (ref 3.87–5.11)
RDW: 24.2 % — ABNORMAL HIGH (ref 11.5–15.5)
SMEAR REVIEW: NORMAL
WBC: 23.7 10*3/uL — AB (ref 4.0–10.5)
nRBC: 0 % (ref 0.0–0.2)

## 2018-10-16 LAB — LACTIC ACID, PLASMA
Lactic Acid, Venous: 1.1 mmol/L (ref 0.5–1.9)
Lactic Acid, Venous: 1.3 mmol/L (ref 0.5–1.9)

## 2018-10-16 LAB — URINE DRUG SCREEN, QUALITATIVE (ARMC ONLY)
Amphetamines, Ur Screen: NOT DETECTED
Barbiturates, Ur Screen: NOT DETECTED
Benzodiazepine, Ur Scrn: NOT DETECTED
COCAINE METABOLITE, UR ~~LOC~~: NOT DETECTED
Cannabinoid 50 Ng, Ur ~~LOC~~: NOT DETECTED
MDMA (Ecstasy)Ur Screen: NOT DETECTED
Methadone Scn, Ur: NOT DETECTED
Opiate, Ur Screen: NOT DETECTED
Phencyclidine (PCP) Ur S: NOT DETECTED
Tricyclic, Ur Screen: NOT DETECTED

## 2018-10-16 LAB — TRIGLYCERIDES: Triglycerides: 70 mg/dL (ref <150)

## 2018-10-16 LAB — URINALYSIS, ROUTINE W REFLEX MICROSCOPIC
Bacteria, UA: NONE SEEN
Bilirubin Urine: NEGATIVE
Glucose, UA: >=500 mg/dL — AB
Ketones, ur: NEGATIVE mg/dL
Leukocytes, UA: NEGATIVE
Nitrite: NEGATIVE
Protein, ur: 100 mg/dL — AB
Specific Gravity, Urine: 1.007 (ref 1.005–1.030)
Squamous Epithelial / HPF: >50 — ABNORMAL HIGH (ref 0–5)
WBC, UA: >50 WBC/hpf — ABNORMAL HIGH (ref 0–5)
pH: 5 (ref 5.0–8.0)

## 2018-10-16 LAB — STREP PNEUMONIAE URINARY ANTIGEN: Strep Pneumo Urinary Antigen: NEGATIVE

## 2018-10-16 LAB — SALICYLATE LEVEL: Salicylate Lvl: <7 mg/dL (ref 2.8–30.0)

## 2018-10-16 LAB — GLUCOSE, CAPILLARY
Glucose-Capillary: 110 mg/dL — ABNORMAL HIGH (ref 70–99)
Glucose-Capillary: 116 mg/dL — ABNORMAL HIGH (ref 70–99)
Glucose-Capillary: 120 mg/dL — ABNORMAL HIGH (ref 70–99)
Glucose-Capillary: 121 mg/dL — ABNORMAL HIGH (ref 70–99)

## 2018-10-16 LAB — ACETAMINOPHEN LEVEL: Acetaminophen (Tylenol), Serum: <10 ug/mL — ABNORMAL LOW (ref 10–30)

## 2018-10-16 LAB — INFLUENZA PANEL BY PCR (TYPE A & B)
Influenza A By PCR: NEGATIVE
Influenza B By PCR: NEGATIVE

## 2018-10-16 LAB — APTT: aPTT: 37 seconds — ABNORMAL HIGH (ref 24–36)

## 2018-10-16 LAB — PROTIME-INR
INR: 1.86
PROTHROMBIN TIME: 21.2 s — AB (ref 11.4–15.2)

## 2018-10-16 LAB — PROCALCITONIN: Procalcitonin: <0.1 ng/mL

## 2018-10-16 MED ORDER — METHYLPREDNISOLONE SODIUM SUCC 40 MG IJ SOLR
40.0000 mg | Freq: Two times a day (BID) | INTRAMUSCULAR | Status: DC
  Administered 2018-10-17 – 2018-10-20 (×8): 40 mg via INTRAVENOUS
  Filled 2018-10-16 (×8): qty 1

## 2018-10-16 MED ORDER — HYDRALAZINE HCL 20 MG/ML IJ SOLN
5.0000 mg | Freq: Once | INTRAMUSCULAR | Status: AC
  Administered 2018-10-16: 5 mg via INTRAVENOUS

## 2018-10-16 MED ORDER — HEPARIN (PORCINE) 25000 UT/250ML-% IV SOLN
1250.0000 [IU]/h | INTRAVENOUS | Status: DC
  Administered 2018-10-16: 1100 [IU]/h via INTRAVENOUS
  Filled 2018-10-16: qty 250

## 2018-10-16 MED ORDER — IPRATROPIUM-ALBUTEROL 0.5-2.5 (3) MG/3ML IN SOLN
3.0000 mL | Freq: Once | RESPIRATORY_TRACT | Status: AC
  Administered 2018-10-16: 3 mL via RESPIRATORY_TRACT

## 2018-10-16 MED ORDER — DEXMEDETOMIDINE HCL IN NACL 400 MCG/100ML IV SOLN
0.4000 ug/kg/h | INTRAVENOUS | Status: DC
  Administered 2018-10-16: 0.4 ug/kg/h via INTRAVENOUS

## 2018-10-16 MED ORDER — VANCOMYCIN HCL 10 G IV SOLR: 1250.0000 mg | INTRAVENOUS | Status: DC

## 2018-10-16 MED ORDER — VANCOMYCIN HCL 500 MG IV SOLR
500.0000 mg | Freq: Once | INTRAVENOUS | Status: AC
  Administered 2018-10-16: 500 mg via INTRAVENOUS
  Filled 2018-10-16: qty 500

## 2018-10-16 MED ORDER — BUDESONIDE 0.5 MG/2ML IN SUSP
0.5000 mg | Freq: Two times a day (BID) | RESPIRATORY_TRACT | Status: DC
  Administered 2018-10-16 – 2018-10-21 (×10): 0.5 mg via RESPIRATORY_TRACT
  Filled 2018-10-16 (×10): qty 2

## 2018-10-16 MED ORDER — HEPARIN BOLUS VIA INFUSION: 3800.0000 [IU] | Freq: Once | INTRAVENOUS | Status: DC

## 2018-10-16 MED ORDER — VANCOMYCIN HCL IN DEXTROSE 1-5 GM/200ML-% IV SOLN
1000.0000 mg | Freq: Once | INTRAVENOUS | Status: AC
  Administered 2018-10-16: 1000 mg via INTRAVENOUS
  Filled 2018-10-16: qty 200

## 2018-10-16 MED ORDER — ONDANSETRON HCL 4 MG PO TABS: 4.0000 mg | ORAL_TABLET | Freq: Four times a day (QID) | ORAL | Status: DC | PRN

## 2018-10-16 MED ORDER — IPRATROPIUM-ALBUTEROL 0.5-2.5 (3) MG/3ML IN SOLN: 3.0000 mL | Freq: Four times a day (QID) | RESPIRATORY_TRACT | Status: DC | PRN

## 2018-10-16 MED ORDER — SODIUM CHLORIDE 0.9 % IV SOLN: 2.0000 g | Freq: Once | INTRAVENOUS | Status: DC

## 2018-10-16 MED ORDER — ACETAZOLAMIDE SODIUM 500 MG IJ SOLR: 250.0000 mg | Freq: Once | INTRAMUSCULAR | Status: DC

## 2018-10-16 MED ORDER — METHYLPREDNISOLONE SODIUM SUCC 125 MG IJ SOLR
125.0000 mg | Freq: Once | INTRAMUSCULAR | Status: AC
  Administered 2018-10-16: 125 mg via INTRAVENOUS
  Filled 2018-10-16: qty 2

## 2018-10-16 MED ORDER — IPRATROPIUM-ALBUTEROL 0.5-2.5 (3) MG/3ML IN SOLN
RESPIRATORY_TRACT | Status: AC
  Administered 2018-10-16: 3 mL via RESPIRATORY_TRACT
  Filled 2018-10-16: qty 9

## 2018-10-16 MED ORDER — PROPOFOL 1000 MG/100ML IV EMUL
5.0000 ug/kg/min | INTRAVENOUS | Status: DC
  Administered 2018-10-16: 20 ug/kg/min via INTRAVENOUS
  Administered 2018-10-17: 30 ug/kg/min via INTRAVENOUS
  Administered 2018-10-17: 20 ug/kg/min via INTRAVENOUS
  Administered 2018-10-17: 35 ug/kg/min via INTRAVENOUS
  Filled 2018-10-16 (×4): qty 100

## 2018-10-16 MED ORDER — ROCURONIUM BROMIDE 50 MG/5ML IV SOLN
100.0000 mg | Freq: Once | INTRAVENOUS | Status: AC
  Administered 2018-10-16: 100 mg via INTRAVENOUS

## 2018-10-16 MED ORDER — HYDRALAZINE HCL 20 MG/ML IJ SOLN
INTRAMUSCULAR | Status: AC
  Filled 2018-10-16: qty 1

## 2018-10-16 MED ORDER — SODIUM CHLORIDE 0.9 % IV SOLN
1.0000 g | Freq: Once | INTRAVENOUS | Status: AC
  Administered 2018-10-16: 1 g via INTRAVENOUS
  Filled 2018-10-16: qty 1

## 2018-10-16 MED ORDER — SODIUM CHLORIDE 0.9 % IV BOLUS
1000.0000 mL | Freq: Once | INTRAVENOUS | Status: AC
  Administered 2018-10-16: 1000 mL via INTRAVENOUS

## 2018-10-16 MED ORDER — ETOMIDATE 2 MG/ML IV SOLN
40.0000 mg | Freq: Once | INTRAVENOUS | Status: AC
  Administered 2018-10-16: 40 mg via INTRAVENOUS

## 2018-10-16 MED ORDER — ORAL CARE MOUTH RINSE
15.0000 mL | OROMUCOSAL | Status: DC
  Administered 2018-10-16 – 2018-10-17 (×10): 15 mL via OROMUCOSAL

## 2018-10-16 MED ORDER — ONDANSETRON HCL 4 MG/2ML IJ SOLN
4.0000 mg | Freq: Four times a day (QID) | INTRAMUSCULAR | Status: DC | PRN
  Filled 2018-10-16: qty 2

## 2018-10-16 MED ORDER — SODIUM CHLORIDE 0.9 % IV SOLN
2.0000 g | Freq: Two times a day (BID) | INTRAVENOUS | Status: DC
  Administered 2018-10-17 – 2018-10-20 (×8): 2 g via INTRAVENOUS
  Filled 2018-10-16 (×9): qty 2

## 2018-10-16 MED ORDER — FUROSEMIDE 10 MG/ML IJ SOLN
40.0000 mg | Freq: Once | INTRAMUSCULAR | Status: AC
  Administered 2018-10-16: 40 mg via INTRAVENOUS
  Filled 2018-10-16: qty 4

## 2018-10-16 MED ORDER — HYDRALAZINE HCL 20 MG/ML IJ SOLN
10.0000 mg | INTRAMUSCULAR | Status: DC | PRN
  Administered 2018-10-17 – 2018-10-19 (×6): 20 mg via INTRAVENOUS
  Filled 2018-10-16 (×6): qty 1

## 2018-10-16 MED ORDER — NICARDIPINE HCL IN NACL 20-0.86 MG/200ML-% IV SOLN
3.0000 mg/h | INTRAVENOUS | Status: DC
  Filled 2018-10-16: qty 200

## 2018-10-16 MED ORDER — INSULIN ASPART 100 UNIT/ML ~~LOC~~ SOLN
0.0000 [IU] | SUBCUTANEOUS | Status: DC
  Administered 2018-10-16 – 2018-10-17 (×2): 1 [IU] via SUBCUTANEOUS
  Administered 2018-10-19: 2 [IU] via SUBCUTANEOUS
  Administered 2018-10-19 – 2018-10-20 (×3): 1 [IU] via SUBCUTANEOUS
  Filled 2018-10-16 (×6): qty 1

## 2018-10-16 MED ORDER — IPRATROPIUM-ALBUTEROL 0.5-2.5 (3) MG/3ML IN SOLN
3.0000 mL | Freq: Four times a day (QID) | RESPIRATORY_TRACT | Status: DC
  Administered 2018-10-16 – 2018-10-18 (×7): 3 mL via RESPIRATORY_TRACT
  Filled 2018-10-16 (×7): qty 3

## 2018-10-16 MED ORDER — CHLORHEXIDINE GLUCONATE 0.12% ORAL RINSE (MEDLINE KIT)
15.0000 mL | Freq: Two times a day (BID) | OROMUCOSAL | Status: DC
  Administered 2018-10-16 – 2018-10-17 (×3): 15 mL via OROMUCOSAL

## 2018-10-16 MED ORDER — HYDRALAZINE HCL 20 MG/ML IJ SOLN
5.0000 mg | Freq: Once | INTRAMUSCULAR | Status: AC
  Administered 2018-10-16: 5 mg via INTRAVENOUS
  Filled 2018-10-16: qty 1

## 2018-10-16 MED ORDER — FAMOTIDINE IN NACL 20-0.9 MG/50ML-% IV SOLN
20.0000 mg | INTRAVENOUS | Status: DC
  Administered 2018-10-16 – 2018-10-19 (×4): 20 mg via INTRAVENOUS
  Filled 2018-10-16 (×4): qty 50

## 2018-10-16 NOTE — ED Provider Notes (Addendum)
[facility] Emergency Department Provider Note  ____________________________________________  Time seen: Approximately 12:54 PM  I have reviewed the triage vital signs and the nursing notes.   HISTORY  Chief Complaint No chief complaint on file.  Level 5 caveat:  Portions of the history and physical were unable to be obtained due to unresponsive   HPI Danielle Munoz is a 78 y.o. female the history of sick sinus syndrome status post pacemaker placement a week ago, CHF, CKD, COPD, hypertension, hypothyroidism, anemia who presents for evaluation of respiratory distress.  EMS was called to the nursing home was patient was found not breathing and foaming through the mouth.  She was unresponsive but had a pulse.  She was bagged in route.  She had normal blood glucose per EMS.  Patient arrives not answering any questions, she is localizing to noxious stimuli, with agonal respirations, thick secretions.    Past Medical History:  Diagnosis Date  . Atrial flutter (HCC)    a. s/p TEE/DCCV 10/18 @ [facility] complicated by asystole and junctional rhythm requiring epi, atropine, and CPR x < 1 minute; b. amio and Eliquis; c. CHADS2VASC => 6 (CHF, HTN, age x 2, vascular disease, female)  . Chronic combined systolic and diastolic CHF (congestive heart failure) (HCC)   . CKD (chronic kidney disease), stage II   . COPD (chronic obstructive pulmonary disease) (HCC)   . Hypertension   . Hypothyroidism   . Iron deficiency anemia   . Morbid obesity (HCC)   . Noncompliance     Patient Active Problem List   Diagnosis Date Noted  . Bradycardia 10/06/2018  . Sepsis (HCC) 09/12/2018  . Acute on chronic diastolic heart failure (HCC)   . Acute on chronic systolic CHF (congestive heart failure) (HCC) 11/07/2017  . Hypothyroidism 11/07/2017  . HTN (hypertension) 11/07/2017  . COPD (chronic obstructive pulmonary disease) (HCC) 11/07/2017    Past Surgical History:  Procedure  Laterality Date  . HERNIA REPAIR    . PACEMAKER IMPLANT N/A 10/09/2018   Procedure: INSERTION PACEMAKER-SINGLE CHAMBER;  Surgeon: Marcina Millard, MD;  Location: [facility] ORS;  Service: Cardiovascular;  Laterality: N/A;    Prior to Admission medications   Medication Sig Start Date End Date Taking? Authorizing Provider  acetaminophen (TYLENOL) 325 MG tablet Take 650 mg by mouth every 6 (six) hours as needed for mild pain, moderate pain or fever.    [provider]  albuterol (PROVENTIL HFA;VENTOLIN HFA) 108 (90 Base) MCG/ACT inhaler Inhale 2 puffs into the lungs every 6 (six) hours as needed for wheezing or shortness of breath.    [provider]  amiodarone (PACERONE) 200 MG tablet Take 1 tablet (200 mg total) by mouth daily. 10/10/18   Alford Highland, MD  apixaban (ELIQUIS) 5 MG TABS tablet Take 5 mg by mouth 2 (two) times daily. 07/16/17   [provider]  atorvastatin (LIPITOR) 40 MG tablet Take 40 mg by mouth at bedtime.     [provider]  Carboxymethylcellulose Sod PF (REFRESH PLUS) 0.5 % SOLN Place 1 drop into both eyes 3 (three) times daily as needed (dryness).    [provider]  cephALEXin (KEFLEX) 250 MG capsule Take 1 capsule (250 mg total) by mouth 3 (three) times daily for 18 doses. 10/10/18 10/16/18  Alford Highland, MD  clotrimazole (LOTRIMIN) 1 % cream Apply 1 application topically 2 (two) times daily.    [provider]  docusate sodium (COLACE) 100 MG capsule Take 200 mg by  mouth daily.    [provider]  ferrous sulfate 325 (65 FE) MG tablet Take 325 mg by mouth daily with breakfast.    [provider]  levothyroxine (SYNTHROID, LEVOTHROID) 100 MCG tablet Take 100 mcg by mouth daily before breakfast.    [provider]  LORazepam (ATIVAN) 0.5 MG tablet Take 0.5 tablets (0.25 mg total) by mouth 2 (two) times daily as needed for anxiety (agitation). 10/10/18   Alford Highland, MD  Melatonin 3 MG  TABS Take 3 mg by mouth at bedtime as needed (sleep).    [provider]  metoprolol succinate (TOPROL-XL) 50 MG 24 hr tablet Take 1 tablet (50 mg total) by mouth daily. Take with or immediately following a meal. 10/10/18   Alford Highland, MD  polyethylene glycol (MIRALAX / GLYCOLAX) packet Take 17 g by mouth daily as needed for mild constipation or moderate constipation.    [provider]  senna-docusate (SENOKOT-S) 8.6-50 MG tablet Take 2 tablets by mouth 2 (two) times daily as needed for mild constipation or moderate constipation.    [provider]  Tiotropium Bromide-Olodaterol (STIOLTO RESPIMAT) 2.5-2.5 MCG/ACT AERS Inhale 2 puffs into the lungs daily.    [provider]  torsemide (DEMADEX) 20 MG tablet Take 40 mg by mouth daily.    [provider]    Allergies Patient has no known allergies.  Family History  Problem Relation Age of Onset  . Diabetes Brother   . CAD Mother   . CAD Father   . Breast cancer Sister     Social History Social History   Tobacco Use  . Smoking status: Former Smoker    Types: Cigarettes  . Smokeless tobacco: Never Used  Substance Use Topics  . Alcohol use: No    Frequency: Never  . Drug use: No    Review of Systems  Constitutional: + unresponsive Respiratory: + difficulty breathing  ____________________________________________   PHYSICAL EXAM:  VITAL SIGNS: ED Triage Vitals  Enc Vitals Group     BP 10/16/18 1153 (!) 153/137     Pulse Rate 10/16/18 1153 (!) 50     Resp 10/16/18 1153 (!) 7     Temp 10/16/18 1230 (!) 96.1 F (35.6 C)     Temp src --      SpO2 10/16/18 1153 94 %     Weight --      Height --      Head Circumference --      Peak Flow --      Pain Score --      Pain Loc --      Pain Edu? --      Excl. in GC? --     Constitutional: Somnolent, arousable to noxious stimuli, agonal respirationsapparent distress. HEENT:      Head: Normocephalic and atraumatic.          Eyes: Conjunctivae are normal. Sclera is non-icteric.       Mouth/Throat: Mucous membranes are moist.       Neck: Supple with no signs of meningismus. Cardiovascular: Paced rhythm, bradycardic rate, regular. No murmurs Respiratory: Agonal respirations, low respiratory rate, extremely diminished air movement bilaterally  gastrointestinal: Soft, non tender, and non distended with positive bowel sounds. No rebound or guarding. Musculoskeletal: Trace edema b/l LE Neurologic: Face is symmetric. Moving all extremities.  Opens her eyes to noxious stimuli, localizes x4, pinpoint pupils Skin: Skin is warm, dry and intact. No rash noted.   ____________________________________________   Vickie Epley (  all labs ordered are listed, but only abnormal results are displayed)  Labs Reviewed  CBC WITH DIFFERENTIAL/PLATELET - Abnormal; Notable for the following components:      Result Value   WBC 23.7 (*)    RBC 6.08 (*)    HCT 50.1 (*)    MCH 21.7 (*)    MCHC 26.3 (*)    RDW 24.2 (*)    All other components within normal limits  COMPREHENSIVE METABOLIC PANEL - Abnormal; Notable for the following components:   CO2 38 (*)    Glucose, Bld 150 (*)    BUN 31 (*)    Creatinine, Ser 1.65 (*)    Calcium 8.7 (*)    GFR calc non Af Amer 30 (*)    GFR calc Af Amer 35 (*)    All other components within normal limits  TROPONIN I - Abnormal; Notable for the following components:   Troponin I 0.04 (*)    All other components within normal limits  CULTURE, BLOOD (ROUTINE X 2)  CULTURE, BLOOD (ROUTINE X 2)  INFLUENZA PANEL BY PCR (TYPE A & B)  LACTIC ACID, PLASMA  BLOOD GAS, VENOUS  LACTIC ACID, PLASMA  URINALYSIS, ROUTINE W REFLEX MICROSCOPIC  I-STAT CG4 LACTIC ACID, ED  I-STAT CG4 LACTIC ACID, ED   ____________________________________________  EKG  ED ECG REPORT I, Nita Sickle, the attending physician, personally viewed and interpreted this ECG.  Ventricular paced rhythm, rate of 50, prolonged  QTC, right axis deviation, no ST elevations or depressions.  New when compared to prior. ____________________________________________  RADIOLOGY  I have personally reviewed the images performed during this visit and I agree with the Radiologist's read.   Interpretation by Radiologist:  Dg Chest Portable 1 View  Result Date: 10/16/2018 CLINICAL DATA:  Intubation, respiratory arrest EXAM: PORTABLE CHEST 1 VIEW COMPARISON:  October 09, 2018 FINDINGS: Endotracheal tube is identified distal tip 5 mm from carina. Retraction by 2 cm is recommended. A nasogastric tube is identified with distal tip not included on film but is at least in the stomach. Cardiac pacemaker is identified unchanged compared prior exam. The heart size is enlarged. Patchy consolidation of left lung base is identified. There is pulmonary edema. There is no pneumothorax. IMPRESSION: Endotracheal tube is identified with distal tip 5 mm from carina. Retraction by 2 cm is recommended. There is no pneumothorax. Congestive heart failure. Patchy consolidation of left lung base suspicious for pneumonia. These results will be called to the ordering clinician or representative by the Radiologist Assistant, and communication documented in the PACS or zVision Dashboard. Electronically Signed   By: Sherian Rein M.D.   On: 10/16/2018 13:23      ____________________________________________   PROCEDURES  Procedure(s) performed:yes Procedure Name: Intubation Date/Time: 10/16/2018 1:04 PM Performed by: Nita Sickle, MD Pre-anesthesia Checklist: Patient identified, Emergency Drugs available, Suction available and Patient being monitored Preoxygenation: Pre-oxygenation with 100% oxygen Induction Type: IV induction and Rapid sequence Ventilation: Mask ventilation without difficulty Laryngoscope Size: Glidescope Tube size: 7.5 mm Number of attempts: 1 Airway Equipment and Method: Video-laryngoscopy Placement Confirmation: ETT inserted  through vocal cords under direct vision,  CO2 detector and Breath sounds checked- equal and bilateral Secured at: 23 cm Tube secured with: ETT holder Dental Injury: Teeth and Oropharynx as per pre-operative assessment       Critical Care performed: yes  CRITICAL CARE Performed by: Nita Sickle  ?  Total critical care time: 40 min  Critical care time was exclusive of separately billable  procedures and treating other patients.  Critical care was necessary to treat or prevent imminent or life-threatening deterioration.  Critical care was time spent personally by me on the following activities: development of treatment plan with patient and/or surrogate as well as nursing, discussions with consultants, evaluation of patient's response to treatment, examination of patient, obtaining history from patient or surrogate, ordering and performing treatments and interventions, ordering and review of laboratory studies, ordering and review of radiographic studies, pulse oximetry and re-evaluation of patient's condition.  ____________________________________________   INITIAL IMPRESSION / ASSESSMENT AND PLAN / ED COURSE  78 y.o. female the history of sick sinus syndrome status post pacemaker placement a week ago, CHF, CKD, COPD, hypertension, hypothyroidism, anemia who presents for evaluation of respiratory distress.  Patient arrives with agonal respirations, thick clear secretions, hypoxic with severely diminished air movement.  She opens her eyes to noxious stimuli and localizes x4.  Patient not answering any questions.  Initial EKG showing paced rhythm with no concordant ST elevations.  Due to severe respiratory depression and agonal respirations patient was intubated after her CODE STATUS was confirmed to be full.  Patient was intubated with etomidate and rocuronium.  She was started on Precedex for sedation.  Postintubation patient again with severely diminished air movement bilaterally and  was started on 3 DuoNeb's and Solu-Medrol.  Labs, chest x-ray, blood gas, flu swab are all pending.  Differential diagnoses including sepsis versus pneumonia versus CHF exacerbation versus influenza.    _________________________ 1:32 PM on 10/16/2018 -----------------------------------------  Patient with HCAP, given cefepime and vancomycin. WBC 23.7, normal lactic, hypothermia concerning for sepsis. Flu negative. Discussed with Dr. Amado CoeGouru for admission  _________________________ 1:43 PM on 10/16/2018 -----------------------------------------  ETT pulled back 2 cm based on CXR to 21 at the lip   As part of my medical decision making, I reviewed the following data within the electronic MEDICAL RECORD NUMBER Nursing notes reviewed and incorporated, Labs reviewed , EKG interpreted , Old EKG reviewed, Old chart reviewed, Radiograph reviewed , Discussed with admitting physician , Notes from prior ED visits and [place] Controlled Substance Database    Pertinent labs & imaging results that were available during my care of the patient were reviewed by me and considered in my medical decision making (see chart for details).    ____________________________________________   FINAL CLINICAL IMPRESSION(S) / ED DIAGNOSES  Final diagnoses:  HCAP (healthcare-associated pneumonia)  Acute respiratory failure with hypoxia (HCC)  Sepsis with acute hypoxic respiratory failure without septic shock, due to unspecified organism [facility](HCC)      NEW MEDICATIONS STARTED DURING THIS VISIT:  ED Discharge Orders    None       Note:  This document was prepared using Dragon voice recognition software and may include unintentional dictation errors.    Don PerkingVeronese, [place], MD 10/16/18 1342    Nita SickleVeronese, [place], MD 10/16/18 1343

## 2018-10-16 NOTE — Consult Note (Signed)
Name: Danielle Munoz MRN: [id number] DOB: April 08, 1941    ADMISSION DATE:  10/16/2018 CONSULTATION DATE: 10/16/2017  REFERRING MD : Dr. Margaretmary Eddy  CHIEF COMPLAINT: Unresponsiveness   BRIEF PATIENT DESCRIPTION:  78 yo female admitted with acute encephalopathy of unknown etiology and acute on chronic respiratory failure secondary to vascular congestion, AECOPD, and pneumonia requiring mechanical intubation   SIGNIFICANT EVENTS/ STUDIES: 01/16-Pt admitted to ICU mechanically intubated 01/16-CT Head revealed no prior study for comparison. Age indeterminate small infarcts in the left cerebellum, the anterior right frontal lobe, and thalami. No associated mass effect.  No acute intracranial hemorrhage.  HISTORY OF PRESENT ILLNESS:   This is a 78 yo female with a PMH of Morbid Obesity, Iron Deficiency Anemia, Hypothyroidism, HTN, COPD, CKD Stage II, Chronic Combined Systolic and Diastolic CHF, Atrial Flutter, and Medication Noncompliance.  She presented to [facility] ER on 01/16 from [facility] after being found unresponsive and foaming at the mouth.  Upon EMS arrival pt had agonal respirations requiring bag mask ventilation, but never lost her pulse. She was recently discharged from [facility] on 10/10/2018 following pacemaker placement due to severe bradycardia.  During current ER visit she remained unresponsive with agonal respirations and thick secretions.  She subsequently required mechanical intubation.  Initial EKG revealed paced rhythm, no ST elevation present.  Lab results revealed CO2 38, glucose 150, BUN 31, creatinine 1.65, troponin 0.04, lactic acid 1.1, wbc 23.7, and vbg pH 7.13/pCO2 43/bicarb 14.3. Influenza PCR negative, however CXR revealed vascular congestion and possible LLL pneumonia. She received iv cefepime and vancomycin.  She was subsequently admitted to ICU for additional workup and treatment.   PAST MEDICAL HISTORY :   has a past medical history of Atrial flutter ([place]), Chronic combined  systolic and diastolic CHF (congestive heart failure) ([place]), CKD (chronic kidney disease), stage II, COPD (chronic obstructive pulmonary disease) ([place]), Hypertension, Hypothyroidism, Iron deficiency anemia, Morbid obesity ([place]), and Noncompliance.  has a past surgical history that includes Hernia repair and PACEMAKER IMPLANT (N/A, 10/09/2018). Prior to Admission medications   Medication Sig Start Date End Date Taking? Authorizing Provider  acetaminophen (TYLENOL) 325 MG tablet Take 650 mg by mouth every 6 (six) hours as needed for mild pain, moderate pain or fever.   Yes [provider]  albuterol (PROVENTIL HFA;VENTOLIN HFA) 108 (90 Base) MCG/ACT inhaler Inhale 2 puffs into the lungs every 6 (six) hours as needed for wheezing or shortness of breath.   Yes [provider]  amiodarone (PACERONE) 200 MG tablet Take 1 tablet (200 mg total) by mouth daily. 10/10/18  Yes Wieting, Richard, MD  apixaban (ELIQUIS) 5 MG TABS tablet Take 5 mg by mouth 2 (two) times daily. 07/16/17  Yes [provider]  atorvastatin (LIPITOR) 40 MG tablet Take 40 mg by mouth at bedtime.    Yes [provider]  Carboxymethylcellulose Sod PF (REFRESH PLUS) 0.5 % SOLN Place 1 drop into both eyes 3 (three) times daily as needed (dryness).   Yes [provider]  cephALEXin (KEFLEX) 250 MG capsule Take 1 capsule (250 mg total) by mouth 3 (three) times daily for 18 doses. 10/10/18 10/16/18 Yes Wieting, Richard, MD  clotrimazole (LOTRIMIN) 1 % cream Apply 1 application topically 2 (two) times daily.   Yes [provider]  docusate sodium (COLACE) 100 MG capsule Take 200 mg by mouth daily.   Yes [provider]  ferrous sulfate 325 (65 FE) MG tablet Take 325 mg by mouth daily with breakfast.   Yes  [provider]  guaifenesin (ROBITUSSIN) 100 MG/5ML syrup Take 100 mg by mouth every 4 (four) hours as needed for cough.   Yes [provider]  levothyroxine  (SYNTHROID, LEVOTHROID) 100 MCG tablet Take 100 mcg by mouth daily before breakfast.   Yes [provider]  LORazepam (ATIVAN) 0.5 MG tablet Take 0.5 tablets (0.25 mg total) by mouth 2 (two) times daily as needed for anxiety (agitation). 10/10/18  Yes Wieting, Richard, MD  Melatonin 3 MG TABS Take 3 mg by mouth at bedtime as needed (sleep).   Yes [provider]  metoprolol succinate (TOPROL-XL) 50 MG 24 hr tablet Take 1 tablet (50 mg total) by mouth daily. Take with or immediately following a meal. 10/10/18  Yes Wieting, Richard, MD  polyethylene glycol (MIRALAX / GLYCOLAX) packet Take 17 g by mouth daily as needed for mild constipation or moderate constipation.   Yes [provider]  senna-docusate (SENOKOT-S) 8.6-50 MG tablet Take 2 tablets by mouth 2 (two) times daily as needed for mild constipation or moderate constipation.   Yes [provider]  Tiotropium Bromide-Olodaterol (STIOLTO RESPIMAT) 2.5-2.5 MCG/ACT AERS Inhale 2 puffs into the lungs daily.   Yes [provider]  torsemide (DEMADEX) 20 MG tablet Take 40 mg by mouth daily.   Yes [provider]   No Known Allergies  FAMILY HISTORY:  family history includes Breast cancer in her sister; CAD in her father and mother; Diabetes in her brother. SOCIAL HISTORY:  reports that she has quit smoking. Her smoking use included cigarettes. She has never used smokeless tobacco. She reports that she does not drink alcohol or use drugs.  REVIEW OF SYSTEMS:   Unable to assess pt intubated   SUBJECTIVE:  Unable to assess pt intubated   VITAL SIGNS: Temp:  [95.9 F (35.5 C)-96.6 F (35.9 C)] 96.6 F (35.9 C) (01/16 1527) Pulse Rate:  [50-66] 66 (01/16 1510) Resp:  [7-26] 16 (01/16 1527) BP: (77-213)/(45-140) 146/56 (01/16 1527) SpO2:  [94 %-100 %] 96 % (01/16 1527) FiO2 (%):  [60 %] 60 % (01/16 1212) Weight:  [109.8 kg] 109.8 kg (01/16 1317)  PHYSICAL EXAMINATION: General: acutely ill  appearing female, NAD mechanically intubated  Neuro: withdraws from painful stimulation, not following command, PERRL  HEENT: supple, supple Cardiovascular: paced rhythm, no R/G Lungs: rhonchi and expiratory wheezes throughout, even, non labored  Abdomen: +BS x4, obese, soft, non distended  Musculoskeletal: 2+ bilateral lower extremity edema  Skin: erythema present bilateral lower extremity   Recent Labs  Lab 10/10/18 0758 10/16/18 1200  NA 144 144  K 3.5 4.5  CL 101 101  CO2 36* 38*  BUN 28* 31*  CREATININE 1.47* 1.65*  GLUCOSE 104* 150*   Recent Labs  Lab 10/16/18 1200  HGB 13.2  HCT 50.1*  WBC 23.7*  PLT 275   Dg Chest Portable 1 View  Result Date: 10/16/2018 CLINICAL DATA:  Intubation, respiratory arrest EXAM: PORTABLE CHEST 1 VIEW COMPARISON:  October 09, 2018 FINDINGS: Endotracheal tube is identified distal tip 5 mm from carina. Retraction by 2 cm is recommended. A nasogastric tube is identified with distal tip not included on film but is at least in the stomach. Cardiac pacemaker is identified unchanged compared prior exam. The heart size is enlarged. Patchy consolidation of left lung base is identified. There is pulmonary edema. There is no pneumothorax. IMPRESSION: Endotracheal tube is identified with distal tip 5 mm from carina. Retraction by 2 cm is recommended. There is no  pneumothorax. Congestive heart failure. Patchy consolidation of left lung base suspicious for pneumonia. These results will be called to the ordering clinician or representative by the Radiologist Assistant, and communication documented in the PACS or zVision Dashboard. Electronically Signed   By: Abelardo Diesel M.D.   On: 10/16/2018 13:23    ASSESSMENT / PLAN:  Acute on chronic hypoxic respiratory failure secondary to pneumonia, AECOPD, and vascular congestion  Hx: Morbid Obesity  Full vent support for now-vent settings reviewed and established  SBT once all parameters met  Scheduled and prn  bronchodilator therapy  IV and nebulized steroids  VAP bundle implemented   Acute on chronic combined systolic and diastolic CHF  Elevated troponin likely secondary to demand ischemia in setting of respiratory failure  HTN Hx: Atrial Flutter and Pacemaker Continuous telemetry monitoring Trend troponin's Prn hydralazine for bp management 40 mg IV lasix x1 dose  Heparin gtt  Trend CBC Monitor for s/sx of bleeding and transfuse for hgb <7  Acute on chronic renal failure  Trend BMP  Replace electrolytes as indicated  Monitor UOP Avoid nephrotoxic medications   Pneumonia  Trend WBC and monitor fever curve  Trend PCT  Follow cultures  Continue cefepime and vancomycin for now, if MRSA PCR negative will discontinue vancomycin   Hyperglycemia  CBG's q4hrs  SSI   Acute encephalopathy of unknown etiology  Mechanical ventilation pain/discomfort  Maintain RASS goal 0 to -1 Propofol gtt and prn fentanyl to maintain RASS goal  WUA daily Urine drug screen, salicylate level, and acetaminophen level pending EEG pending If mentation does not improve will consult neurology   Marda Stalker, [place] Pager [phone] (please enter 7 digits) PCCM Consult Pager [phone] (please enter 7 digits)

## 2018-10-16 NOTE — Consult Note (Addendum)
ANTICOAGULATION CONSULT NOTE - Initial Consult  Pharmacy Consult for heparin infusion Indication: atrial fibrillation  No Known Allergies  Patient Measurements: Weight: 242 lb 1 oz (109.8 kg) Heparin Dosing Weight: 76.69 kg (calculated)  Vital Signs: Temp: 96.2 F (35.7 C) (01/16 1425) BP: 203/82 (01/16 1425) Pulse Rate: 63 (01/16 1425)  Labs: Recent Labs    10/16/18 1200  HGB 13.2  HCT 50.1*  PLT 275  CREATININE 1.65*  TROPONINI 0.04*    Estimated Creatinine Clearance: 32.6 mL/min (A) (by C-G formula based on SCr of 1.65 mg/dL (H)).   Medical History: Past Medical History:  Diagnosis Date  . Atrial flutter (HCC)    a. s/p TEE/DCCV 10/18 @ [facility] complicated by asystole and junctional rhythm requiring epi, atropine, and CPR x < 1 minute; b. amio and Eliquis; c. CHADS2VASC => 6 (CHF, HTN, age x 2, vascular disease, female)  . Chronic combined systolic and diastolic CHF (congestive heart failure) (HCC)   . CKD (chronic kidney disease), stage II   . COPD (chronic obstructive pulmonary disease) (HCC)   . Hypertension   . Hypothyroidism   . Iron deficiency anemia   . Morbid obesity (HCC)   . Noncompliance     Medications:  (Not in a hospital admission)   Assessment: 78 y.o. female the history of sick sinus syndrome status post pacemaker placement a week ago, CHF, CKD, COPD, hypertension, hypothyroidism, anemia who presents for evaluation of respiratory distress.  EMS was called to the nursing home was patient was found not breathing and foaming through the mouth.  She was unresponsive but had a pulse.  She was bagged in route. Patient is now intubated and therefore changing to a heparin drip.  Patient PTA on Eliquis for a.fib.  Last dose 1/16 0800.  Goal of Therapy:  Heparin level 0.3-0.7 units/ml aPTT 66-102 seconds seconds Monitor platelets by anticoagulation protocol: Yes   Plan:  Per prior history list patient last received Eliquis at approximately 0800 today  1/16.  Therefore will plan initiation of heparin drip to be at 2000. Will hold bolus given patient's current renal function and PTA of Eliquis as well as not currently in a.fib. Start heparin infusion at 1100 units/hr Check aPTT level every 8 hours until aPTT and heparin level correlate to therapeutic level and then will follow heparin level daily and CBC daily while on heparin Continue to monitor H&H and platelets  Orinda Kenner, PharmD Clinical Pharmacist 10/16/2018,2:34 PM

## 2018-10-16 NOTE — H&P (Signed)
[place] [facility] [place] NAME: Danielle Munoz    MR#:  [id number]  DATE OF BIRTH:  1941/07/22  DATE OF ADMISSION:  10/16/2018  PRIMARY CARE PHYSICIAN: [facility]   REQUESTING/REFERRING PHYSICIAN: Rudene Re, MD  CHIEF COMPLAINT:   Shortness of breath HISTORY OF PRESENT ILLNESS:  Danielle Munoz  is a 78 y.o. female with a known history of sick sinus syndrome status post pacemaker placement by Dr. Saralyn Pilar approximately 1 week ago, CHF, CKD, COPD, hypertension and hypothyroidism and other medical problems came into the ED for respiratory distress.  EMS was called as the patient was not breathing well and the hospital nursing home staff has noticed froth from her mouth.  By the time patient arrived to the hospital she was unresponsive but had a pulse.  Patient was bagged on her way to ED.  Normal blood glucose.  PAST MEDICAL HISTORY:   Past Medical History:  Diagnosis Date  . Atrial flutter ([place])    a. s/p TEE/DCCV 54/62 @ [facility] complicated by asystole and junctional rhythm requiring epi, atropine, and CPR x < 1 minute; b. amio and Eliquis; c. CHADS2VASC => 6 (CHF, HTN, age x 2, vascular disease, female)  . Chronic combined systolic and diastolic CHF (congestive heart failure) ([place])   . CKD (chronic kidney disease), stage II   . COPD (chronic obstructive pulmonary disease) ([place])   . Hypertension   . Hypothyroidism   . Iron deficiency anemia   . Morbid obesity ([place])   . Noncompliance     PAST SURGICAL HISTOIRY:   Past Surgical History:  Procedure Laterality Date  . HERNIA REPAIR    . PACEMAKER IMPLANT N/A 10/09/2018   Procedure: INSERTION PACEMAKER-SINGLE CHAMBER;  Surgeon: Isaias Cowman, MD;  Location: [facility] ORS;  Service: Cardiovascular;  Laterality: N/A;    SOCIAL HISTORY:   Social History   Tobacco Use  . Smoking status: Former Smoker    Types: Cigarettes  . Smokeless tobacco: Never Used  Substance  Use Topics  . Alcohol use: No    Frequency: Never    FAMILY HISTORY:   Family History  Problem Relation Age of Onset  . Diabetes Brother   . CAD Mother   . CAD Father   . Breast cancer Sister     DRUG ALLERGIES:  No Known Allergies  REVIEW OF SYSTEMS:  Unobtainable patient is intubated  MEDICATIONS AT HOME:   Prior to Admission medications   Medication Sig Start Date End Date Taking? Authorizing Provider  acetaminophen (TYLENOL) 325 MG tablet Take 650 mg by mouth every 6 (six) hours as needed for mild pain, moderate pain or fever.   Yes [provider]  albuterol (PROVENTIL HFA;VENTOLIN HFA) 108 (90 Base) MCG/ACT inhaler Inhale 2 puffs into the lungs every 6 (six) hours as needed for wheezing or shortness of breath.   Yes [provider]  amiodarone (PACERONE) 200 MG tablet Take 1 tablet (200 mg total) by mouth daily. 10/10/18  Yes Wieting, Richard, MD  apixaban (ELIQUIS) 5 MG TABS tablet Take 5 mg by mouth 2 (two) times daily. 07/16/17  Yes [provider]  atorvastatin (LIPITOR) 40 MG tablet Take 40 mg by mouth at bedtime.    Yes [provider]  Carboxymethylcellulose Sod PF (REFRESH PLUS) 0.5 % SOLN Place 1 drop into both eyes 3 (three) times daily as needed (dryness).   Yes [provider]  cephALEXin (KEFLEX) 250 MG capsule Take 1 capsule (  250 mg total) by mouth 3 (three) times daily for 18 doses. 10/10/18 10/16/18 Yes Wieting, Richard, MD  clotrimazole (LOTRIMIN) 1 % cream Apply 1 application topically 2 (two) times daily.   Yes [provider]  docusate sodium (COLACE) 100 MG capsule Take 200 mg by mouth daily.   Yes [provider]  ferrous sulfate 325 (65 FE) MG tablet Take 325 mg by mouth daily with breakfast.   Yes [provider]  guaifenesin (ROBITUSSIN) 100 MG/5ML syrup Take 100 mg by mouth every 4 (four) hours as needed for cough.   Yes [provider]  levothyroxine (SYNTHROID,  LEVOTHROID) 100 MCG tablet Take 100 mcg by mouth daily before breakfast.   Yes [provider]  LORazepam (ATIVAN) 0.5 MG tablet Take 0.5 tablets (0.25 mg total) by mouth 2 (two) times daily as needed for anxiety (agitation). 10/10/18  Yes Wieting, Richard, MD  Melatonin 3 MG TABS Take 3 mg by mouth at bedtime as needed (sleep).   Yes [provider]  metoprolol succinate (TOPROL-XL) 50 MG 24 hr tablet Take 1 tablet (50 mg total) by mouth daily. Take with or immediately following a meal. 10/10/18  Yes Wieting, Richard, MD  polyethylene glycol (MIRALAX / GLYCOLAX) packet Take 17 g by mouth daily as needed for mild constipation or moderate constipation.   Yes [provider]  senna-docusate (SENOKOT-S) 8.6-50 MG tablet Take 2 tablets by mouth 2 (two) times daily as needed for mild constipation or moderate constipation.   Yes [provider]  Tiotropium Bromide-Olodaterol (STIOLTO RESPIMAT) 2.5-2.5 MCG/ACT AERS Inhale 2 puffs into the lungs daily.   Yes [provider]  torsemide (DEMADEX) 20 MG tablet Take 40 mg by mouth daily.   Yes [provider]      VITAL SIGNS:  Blood pressure (!) 199/77, pulse 63, temperature (!) 96.2 F (35.7 C), resp. rate (!) 24, weight 109.8 kg, SpO2 100 %.  PHYSICAL EXAMINATION:  GENERAL:  78 y.o.-year-old patient lying in the bed with no acute distress.  EYES: Pupils equal, round, reactive to light and accommodation. No scleral icterus. Extraocular muscles intact.  HEENT: Head atraumatic, normocephalic. Oropharynx.  NG tube intact NECK:  Supple, no jugular venous distention. No thyroid enlargement, no tenderness.  LUNGS: Diminished ,ronchial breath sounds bilaterally, no wheezing, rales,. No use of accessory muscles of respiration.  Got intubated CARDIOVASCULAR: S1, S2 normal. No murmurs, rubs, or gallops.  ABDOMEN: Soft, nontender, nondistended. Bowel sounds present.  EXTREMITIES: No pedal edema, cyanosis, or  clubbing.  NEUROLOGIC: Intubated sensation intact. Gait not checked.   SKIN: No obvious rash, lesion, or ulcer.   LABORATORY PANEL:   CBC Recent Labs  Lab 10/16/18 1200  WBC 23.7*  HGB 13.2  HCT 50.1*  PLT 275   ------------------------------------------------------------------------------------------------------------------  Chemistries  Recent Labs  Lab 10/16/18 1200  NA 144  K 4.5  CL 101  CO2 38*  GLUCOSE 150*  BUN 31*  CREATININE 1.65*  CALCIUM 8.7*  AST 21  ALT 10  ALKPHOS 94  BILITOT 0.8   ------------------------------------------------------------------------------------------------------------------  Cardiac Enzymes Recent Labs  Lab 10/16/18 1200  TROPONINI 0.04*   ------------------------------------------------------------------------------------------------------------------  RADIOLOGY:  Dg Chest Portable 1 View  Result Date: 10/16/2018 CLINICAL DATA:  Intubation, respiratory arrest EXAM: PORTABLE CHEST 1 VIEW COMPARISON:  October 09, 2018 FINDINGS: Endotracheal tube is identified distal tip 5 mm from carina. Retraction by 2 cm is recommended. A nasogastric tube is identified with distal tip not included on film but is  at least in the stomach. Cardiac pacemaker is identified unchanged compared prior exam. The heart size is enlarged. Patchy consolidation of left lung base is identified. There is pulmonary edema. There is no pneumothorax. IMPRESSION: Endotracheal tube is identified with distal tip 5 mm from carina. Retraction by 2 cm is recommended. There is no pneumothorax. Congestive heart failure. Patchy consolidation of left lung base suspicious for pneumonia. These results will be called to the ordering clinician or representative by the Radiologist Assistant, and communication documented in the PACS or zVision Dashboard. Electronically Signed   By: Abelardo Diesel M.D.   On: 10/16/2018 13:23    EKG:   Orders placed or performed during the hospital  encounter of 10/16/18  . ED EKG 12-Lead  . ED EKG 12-Lead    IMPRESSION AND PLAN:     #Acute hypoxic respiratory failure probably aspiration pneumonia Admit to intensive care unit Vent management per protocol by intensivist notified Dr. Jamal Collin he is aware Chest x-ray with patchy consolidation of the left lung base suspicious for pneumonia Broad-spectrum IV antibiotics Follow-up on the cultures  #Sepsis Patient met septic criteria with hypotension and hypothermia at the time of arrival Sepsis protocol implemented, cultures and broad-spectrum IV antibiotics Bear hugger if needed  #Malignant hypertension Cardene drip and titrate as needed  #History of sick sinus syndrome status post pacemaker placement 1 week ago Eliquis is on hold  heparin drip started instead of Eliquis Consult cardiology-[facility]   #Chronic COPD  Solu-Medrol was given once Bronchodilator treatments   All the records are reviewed and case discussed with ED provider. Management plans discussed with the patient, family and they are in agreement.  CODE STATUS: fc  TOTAL CRITICAL CARE TIME TAKING CARE OF THIS PATIENT: 45 minutes.   Note: This dictation was prepared with Dragon dictation along with smaller phrase technology. Any transcriptional errors that result from this process are unintentional.  Nicholes Mango M.D on 10/16/2018 at 2:29 PM  Between 7am to 6pm - Pager - [phone]  After 6pm go to [email or web address] - password [email or web address] [facility]  [place] [facility]  Office  [phone]  CC: Primary care physician; Clinic, [facility]

## 2018-10-16 NOTE — Progress Notes (Signed)
Patient arrived around 16:00. Orders for Lactic acid every 2 hours times 2. Called lab regarding blood draw. They will send someone stat.

## 2018-10-16 NOTE — ED Notes (Addendum)
Carmie Kanner, pt's sister would like to be pt's POC in case daughter is not available. [phone].

## 2018-10-16 NOTE — ED Notes (Signed)
40mg  etomidate per Don Perking MD

## 2018-10-16 NOTE — Consult Note (Signed)
Pharmacy Antibiotic Note  Danielle Munoz is a 78 y.o. female admitted on 10/16/2018 with pneumonia.  Pharmacy has been consulted for cefepime and vancomcyin dosing.  Plan: Cefepime 2 gm IV every 12 hours  Loading dose: Vancomycin 1500 mg IV once (given as 2 doses; 1000 mg and 500 mg) Vancomycin 1250 mg IV Q 48 hrs. Goal AUC 400-550. Expected AUC: 524.8 SCr used: 1.65  Weight: 242 lb 1 oz (109.8 kg)  Temp (24hrs), Avg:96.1 F (35.6 C), Min:95.9 F (35.5 C), Max:96.3 F (35.7 C)  Recent Labs  Lab 10/10/18 0758 10/16/18 1200  WBC  --  23.7*  CREATININE 1.47* 1.65*  LATICACIDVEN  --  1.1    Estimated Creatinine Clearance: 32.6 mL/min (A) (by C-G formula based on SCr of 1.65 mg/dL (H)).    No Known Allergies  Antimicrobials this admission: Cefepime 1/16 >>  Vancomycin 1/16 >>   Dose adjustments this admission:   Microbiology results: 1/16 BCx: pending  1/16 MRSA PCR: pending collection  Thank you for allowing pharmacy to be a part of this patient's care.  Orinda Kennerhris A Shondra Capps, PharmD Clinical Pharmacist 10/16/2018 2:54 PM

## 2018-10-16 NOTE — Progress Notes (Signed)
CODE SEPSIS - PHARMACY COMMUNICATION  **Broad Spectrum Antibiotics should be administered within 1 hour of Sepsis diagnosis**  Time Code Sepsis Called/Page Received: 1309  Antibiotics Ordered: Cefepime and Vancomycin  Time of 1st antibiotic administration: 1324  Additional action taken by pharmacy: n/a  If necessary, Name of Provider/Nurse Contacted: n/a    Orinda Kenner ,PharmD Clinical Pharmacist  10/16/2018  1:16 PM

## 2018-10-16 NOTE — ED Triage Notes (Signed)
Pt presents to ED via [facility] with c/o unresponsive from nursing facility. Per EMS pt was found this morning to be unresponsive in the bed with fluid coming out of her mouth. Pt presents to ED with minimal response to painful stimuli being bagged by EMS.

## 2018-10-16 NOTE — ED Notes (Signed)
EDP at bedside, family brought back to bedside at this time.

## 2018-10-16 NOTE — Anesthesia Postprocedure Evaluation (Signed)
Anesthesia Post Note  Patient: Danielle Munoz  Procedure(s) Performed: INSERTION PACEMAKER-SINGLE CHAMBER (N/A )  Patient location during evaluation: PACU Anesthesia Type: General Level of consciousness: awake and alert Pain management: pain level controlled Vital Signs Assessment: post-procedure vital signs reviewed and stable Respiratory status: spontaneous breathing, nonlabored ventilation, respiratory function stable and patient connected to nasal cannula oxygen Cardiovascular status: blood pressure returned to baseline and stable Postop Assessment: no apparent nausea or vomiting Anesthetic complications: no     Last Vitals:  Vitals:   10/10/18 0826 10/10/18 1528  BP: (!) 168/75 (!) 160/75  Pulse: 78 78  Resp:  18  Temp: 37 C 36.9 C  SpO2: 98% 95%    Last Pain:  Vitals:   10/10/18 0826  TempSrc: Oral  PainSc: 0-No pain                 Lenard Simmer

## 2018-10-17 ENCOUNTER — Inpatient Hospital Stay: Payer: Medicare Other

## 2018-10-17 DIAGNOSIS — J9621 Acute and chronic respiratory failure with hypoxia: Secondary | ICD-10-CM

## 2018-10-17 LAB — CBC
HCT: 38.2 % (ref 36.0–46.0)
Hemoglobin: 11 g/dL — ABNORMAL LOW (ref 12.0–15.0)
MCH: 21.6 pg — ABNORMAL LOW (ref 26.0–34.0)
MCHC: 28.8 g/dL — ABNORMAL LOW (ref 30.0–36.0)
MCV: 75 fL — ABNORMAL LOW (ref 80.0–100.0)
Platelets: 216 K/uL (ref 150–400)
RBC: 5.09 MIL/uL (ref 3.87–5.11)
RDW: 23.8 % — ABNORMAL HIGH (ref 11.5–15.5)
WBC: 16.7 K/uL — ABNORMAL HIGH (ref 4.0–10.5)
nRBC: 0 % (ref 0.0–0.2)

## 2018-10-17 LAB — PROCALCITONIN: Procalcitonin: 0.11 ng/mL

## 2018-10-17 LAB — BASIC METABOLIC PANEL
Anion gap: 8 (ref 5–15)
BUN: 35 mg/dL — ABNORMAL HIGH (ref 8–23)
CO2: 35 mmol/L — ABNORMAL HIGH (ref 22–32)
Calcium: 8.3 mg/dL — ABNORMAL LOW (ref 8.9–10.3)
Chloride: 103 mmol/L (ref 98–111)
Creatinine, Ser: 1.67 mg/dL — ABNORMAL HIGH (ref 0.44–1.00)
GFR calc non Af Amer: 29 mL/min — ABNORMAL LOW (ref >60)
GFR, EST AFRICAN AMERICAN: 34 mL/min — AB (ref >60)
Glucose, Bld: 101 mg/dL — ABNORMAL HIGH (ref 70–99)
Potassium: 3.2 mmol/L — ABNORMAL LOW (ref 3.5–5.1)
Sodium: 146 mmol/L — ABNORMAL HIGH (ref 135–145)

## 2018-10-17 LAB — GLUCOSE, CAPILLARY
GLUCOSE-CAPILLARY: 101 mg/dL — AB (ref 70–99)
Glucose-Capillary: 103 mg/dL — ABNORMAL HIGH (ref 70–99)
Glucose-Capillary: 96 mg/dL (ref 70–99)
Glucose-Capillary: 125 mg/dL — ABNORMAL HIGH (ref 70–99)
Glucose-Capillary: 115 mg/dL — ABNORMAL HIGH (ref 70–99)
Glucose-Capillary: 120 mg/dL — ABNORMAL HIGH (ref 70–99)

## 2018-10-17 LAB — BRAIN NATRIURETIC PEPTIDE: B Natriuretic Peptide: 1484 pg/mL — ABNORMAL HIGH (ref 0.0–100.0)

## 2018-10-17 LAB — BLOOD GAS, ARTERIAL
Acid-Base Excess: 12.7 mmol/L — ABNORMAL HIGH (ref 0.0–2.0)
Acid-Base Excess: 14.8 mmol/L — ABNORMAL HIGH (ref 0.0–2.0)
Allens test (pass/fail): POSITIVE — AB
BICARBONATE: 37.5 mmol/L — AB (ref 20.0–28.0)
Bicarbonate: 41.7 mmol/L — ABNORMAL HIGH (ref 20.0–28.0)
FIO2: 0.6
FIO2: 35
MECHVT: 450 mL
Mode: POSITIVE
O2 Saturation: 99.4 %
O2 Saturation: 96.5 %
PEEP/CPAP: 5 cmH2O
PEEP: 5 cmH2O
Patient temperature: 37
Patient temperature: 37
Pressure support: 10 cmH2O
RATE: 15 resp/min
pCO2 arterial: 47 mmHg (ref 32.0–48.0)
pCO2 arterial: 60 mmHg — ABNORMAL HIGH (ref 32.0–48.0)
pH, Arterial: 7.51 — ABNORMAL HIGH (ref 7.350–7.450)
pH, Arterial: 7.45 (ref 7.350–7.450)
pO2, Arterial: 140 mmHg — ABNORMAL HIGH (ref 83.0–108.0)
pO2, Arterial: 82 mmHg — ABNORMAL LOW (ref 83.0–108.0)

## 2018-10-17 LAB — CORTISOL-AM, BLOOD: Cortisol - AM: 12 ug/dL (ref 6.7–22.6)

## 2018-10-17 LAB — PROTIME-INR
INR: 1.75
Prothrombin Time: 20.2 seconds — ABNORMAL HIGH (ref 11.4–15.2)

## 2018-10-17 LAB — HEPARIN LEVEL (UNFRACTIONATED): HEPARIN UNFRACTIONATED: 3.36 [IU]/mL — AB (ref 0.30–0.70)

## 2018-10-17 LAB — APTT: aPTT: 38 seconds — ABNORMAL HIGH (ref 24–36)

## 2018-10-17 MED ORDER — ORAL CARE MOUTH RINSE
15.0000 mL | Freq: Two times a day (BID) | OROMUCOSAL | Status: DC
  Administered 2018-10-17 – 2018-10-21 (×6): 15 mL via OROMUCOSAL

## 2018-10-17 MED ORDER — DEXMEDETOMIDINE HCL IN NACL 400 MCG/100ML IV SOLN
0.4000 ug/kg/h | INTRAVENOUS | Status: DC
  Administered 2018-10-17: 0.6 ug/kg/h via INTRAVENOUS
  Administered 2018-10-17: 0.4 ug/kg/h via INTRAVENOUS
  Filled 2018-10-17 (×2): qty 100

## 2018-10-17 MED ORDER — NOREPINEPHRINE-SODIUM CHLORIDE 4-0.9 MG/250ML-% IV SOLN: 0.0000 ug/min | INTRAVENOUS | Status: DC

## 2018-10-17 MED ORDER — FUROSEMIDE 10 MG/ML IJ SOLN
20.0000 mg | Freq: Once | INTRAMUSCULAR | Status: AC
  Administered 2018-10-17: 20 mg via INTRAVENOUS

## 2018-10-17 MED ORDER — LEVOTHYROXINE SODIUM 100 MCG PO TABS
100.0000 ug | ORAL_TABLET | Freq: Every day | ORAL | Status: DC
  Administered 2018-10-18 – 2018-10-22 (×4): 100 ug via ORAL
  Filled 2018-10-17 (×4): qty 1

## 2018-10-17 MED ORDER — POTASSIUM CHLORIDE 20 MEQ/15ML (10%) PO SOLN
20.0000 meq | Freq: Once | ORAL | Status: AC
  Administered 2018-10-17: 20 meq via ORAL
  Filled 2018-10-17 (×2): qty 15

## 2018-10-17 MED ORDER — APIXABAN 5 MG PO TABS
5.0000 mg | ORAL_TABLET | Freq: Two times a day (BID) | ORAL | Status: DC
  Administered 2018-10-17 – 2018-10-22 (×11): 5 mg via ORAL
  Filled 2018-10-17 (×11): qty 1

## 2018-10-17 MED ORDER — DOCUSATE SODIUM 100 MG PO CAPS
100.0000 mg | ORAL_CAPSULE | Freq: Two times a day (BID) | ORAL | Status: DC
  Administered 2018-10-18 – 2018-10-22 (×8): 100 mg via ORAL
  Filled 2018-10-17 (×9): qty 1

## 2018-10-17 MED ORDER — HEPARIN BOLUS VIA INFUSION
2000.0000 [IU] | Freq: Once | INTRAVENOUS | Status: AC
  Administered 2018-10-17: 2000 [IU] via INTRAVENOUS
  Filled 2018-10-17: qty 2000

## 2018-10-17 MED ORDER — ATORVASTATIN CALCIUM 20 MG PO TABS
40.0000 mg | ORAL_TABLET | Freq: Every day | ORAL | Status: DC
  Administered 2018-10-18 – 2018-10-22 (×5): 40 mg via ORAL
  Filled 2018-10-17 (×5): qty 2

## 2018-10-17 MED ORDER — FUROSEMIDE 10 MG/ML IJ SOLN
INTRAMUSCULAR | Status: AC
  Filled 2018-10-17: qty 2

## 2018-10-17 NOTE — Consult Note (Signed)
ANTICOAGULATION CONSULT NOTE - Initial Consult  Pharmacy Consult for heparin infusion Indication: atrial fibrillation  No Known Allergies  Patient Measurements: Weight: 242 lb 1 oz (109.8 kg) Heparin Dosing Weight: 76.69 kg (calculated)  Vital Signs: Temp: 100.8 F (38.2 C) (01/17 0200) Temp Source: Bladder (01/17 0000) BP: 110/55 (01/17 0600) Pulse Rate: 53 (01/17 0600)  Labs: Recent Labs    10/16/18 1200 10/16/18 1759 10/16/18 2314 10/17/18 0409 10/17/18 0526  HGB 13.2  --   --   --  11.0*  HCT 50.1*  --   --   --  38.2  PLT 275  --   --   --  216  APTT 37*  --   --  38*  --   LABPROT 21.2*  --   --  20.2*  --   INR 1.86  --   --  1.75  --   HEPARINUNFRC  --   --   --  3.36*  --   CREATININE 1.65*  --   --  1.67*  --   TROPONINI 0.04* 0.03* 0.04*  --   --     Estimated Creatinine Clearance: 32.2 mL/min (A) (by C-G formula based on SCr of 1.67 mg/dL (H)).   Medical History: Past Medical History:  Diagnosis Date  . Atrial flutter (HCC)    a. s/p TEE/DCCV 10/18 @ [facility] complicated by asystole and junctional rhythm requiring epi, atropine, and CPR x < 1 minute; b. amio and Eliquis; c. CHADS2VASC => 6 (CHF, HTN, age x 2, vascular disease, female)  . Chronic combined systolic and diastolic CHF (congestive heart failure) (HCC)   . CKD (chronic kidney disease), stage II   . COPD (chronic obstructive pulmonary disease) (HCC)   . Hypertension   . Hypothyroidism   . Iron deficiency anemia   . Morbid obesity (HCC)   . Noncompliance     Medications:  Medications Prior to Admission  Medication Sig Dispense Refill Last Dose  . acetaminophen (TYLENOL) 325 MG tablet Take 650 mg by mouth every 6 (six) hours as needed for mild pain, moderate pain or fever.   Unknown at PRN  . albuterol (PROVENTIL HFA;VENTOLIN HFA) 108 (90 Base) MCG/ACT inhaler Inhale 2 puffs into the lungs every 6 (six) hours as needed for wheezing or shortness of breath.   Unknown at PRN  . amiodarone  (PACERONE) 200 MG tablet Take 1 tablet (200 mg total) by mouth daily. 30 tablet 0 10/16/2018 at 0800  . apixaban (ELIQUIS) 5 MG TABS tablet Take 5 mg by mouth 2 (two) times daily.   10/16/2018 at 0800  . atorvastatin (LIPITOR) 40 MG tablet Take 40 mg by mouth at bedtime.    10/15/2018 at 2000  . Carboxymethylcellulose Sod PF (REFRESH PLUS) 0.5 % SOLN Place 1 drop into both eyes 3 (three) times daily as needed (dryness).   Unknown at PRN  . [EXPIRED] cephALEXin (KEFLEX) 250 MG capsule Take 1 capsule (250 mg total) by mouth 3 (three) times daily for 18 doses. 18 capsule 0 10/16/2018 at 0600  . clotrimazole (LOTRIMIN) 1 % cream Apply 1 application topically 2 (two) times daily.     Marland Kitchen docusate sodium (COLACE) 100 MG capsule Take 200 mg by mouth daily.   10/16/2018 at 0800  . ferrous sulfate 325 (65 FE) MG tablet Take 325 mg by mouth daily with breakfast.   10/16/2018 at 0800  . guaifenesin (ROBITUSSIN) 100 MG/5ML syrup Take 100 mg by mouth every 4 (four) hours as  needed for cough.   Unknown at PRN  . levothyroxine (SYNTHROID, LEVOTHROID) 100 MCG tablet Take 100 mcg by mouth daily before breakfast.   10/16/2018 at 0700  . LORazepam (ATIVAN) 0.5 MG tablet Take 0.5 tablets (0.25 mg total) by mouth 2 (two) times daily as needed for anxiety (agitation). 30 tablet 0 10/16/2018 at PRN  . Melatonin 3 MG TABS Take 3 mg by mouth at bedtime as needed (sleep).   Unknown at PRN  . metoprolol succinate (TOPROL-XL) 50 MG 24 hr tablet Take 1 tablet (50 mg total) by mouth daily. Take with or immediately following a meal. 30 tablet 0 10/16/2018 at 0800  . polyethylene glycol (MIRALAX / GLYCOLAX) packet Take 17 g by mouth daily as needed for mild constipation or moderate constipation.   Unknown at PRN  . senna-docusate (SENOKOT-S) 8.6-50 MG tablet Take 2 tablets by mouth 2 (two) times daily as needed for mild constipation or moderate constipation.   Unknown at PRN  . Tiotropium Bromide-Olodaterol (STIOLTO RESPIMAT) 2.5-2.5 MCG/ACT  AERS Inhale 2 puffs into the lungs daily.   10/16/2018 at 0800  . torsemide (DEMADEX) 20 MG tablet Take 40 mg by mouth daily.   10/16/2018 at 0800    Assessment: 78 y.o. female the history of sick sinus syndrome status post pacemaker placement a week ago, CHF, CKD, COPD, hypertension, hypothyroidism, anemia who presents for evaluation of respiratory distress.  EMS was called to the nursing home was patient was found not breathing and foaming through the mouth.  She was unresponsive but had a pulse.  She was bagged in route. Patient is now intubated and therefore changing to a heparin drip.  Patient PTA on Eliquis for a.fib.  Last dose 1/16 0800.  Goal of Therapy:  Heparin level 0.3-0.7 units/ml aPTT 66-102 seconds seconds Monitor platelets by anticoagulation protocol: Yes   Plan:  01/17 @ 0400 aPTT 38 seconds; HL 3.36. aPTT subtherapeutic, HL supratherapeutic from eliquis and levels not correlating. Will bolus w/ heparin 2000 units IV x 1 and increase rate to 1250 units/hr and will recheck aPTT @ 1300 w/ HL check w/ am labs. CBC trended down will continue to monitor.  Thomasene Rippleavid  Rayshard Schirtzinger, PharmD Clinical Pharmacist 10/17/2018,6:33 AM

## 2018-10-17 NOTE — Consult Note (Signed)
[facility] Consultation Note  [name], MRN: [id number], DOB/AGE: September 22, 1941 78 y.o. Admit date: 10/16/2018   Date of Consult: 10/17/2018 Primary Physician: [facility] Primary Cardiologist: [facility]  Chief Complaint: No chief complaint on file.  Reason for Consult: Sick sinus syndrome with elevated troponin  HPI: 78 y.o. female with known apparent sick sinus syndrome with need for pacemaker placement in the remote past essential hypertension mixed hyperlipidemia on appropriate medication management.  The patient has done fairly well recently with no evidence of current current cardiac symptoms of the heart failure and or myocardial infarction or anginal equivalent.  The patient has had new onset significant respiratory failure cough and congestion for which the patient was seen in the emergency room.  At that time the patient was severely hypoxic and needing intubation.  Intubation was occurred and the patient is sedated with good oxygenation at this time.  Elevated white blood cell count and hypoxia suggest primary pulmonary issue at this time.  Patient also does have chronic kidney disease with a creatinine of 1.6 and currently a troponin of 0.04 consistent with demand ischemia.  There is been no evidence of significant new EKG changes or concerns for pacemaker.  The patient is hemodynamically stable today  Past Medical History:  Diagnosis Date  . Atrial flutter ([place])    a. s/p TEE/DCCV 25/85 @ [facility] complicated by asystole and junctional rhythm requiring epi, atropine, and CPR x < 1 minute; b. amio and Eliquis; c. CHADS2VASC => 6 (CHF, HTN, age x 2, vascular disease, female)  . Chronic combined systolic and diastolic CHF (congestive heart failure) ([place])   . CKD (chronic kidney disease), stage II   . COPD (chronic obstructive pulmonary disease) ([place])   . Hypertension   . Hypothyroidism   . Iron deficiency anemia   . Morbid obesity ([place])   .  Noncompliance       Surgical History:  Past Surgical History:  Procedure Laterality Date  . HERNIA REPAIR    . PACEMAKER IMPLANT N/A 10/09/2018   Procedure: INSERTION PACEMAKER-SINGLE CHAMBER;  Surgeon: Isaias Cowman, MD;  Location: [facility] ORS;  Service: Cardiovascular;  Laterality: N/A;     Home Meds: Prior to Admission medications   Medication Sig Start Date End Date Taking? Authorizing Provider  acetaminophen (TYLENOL) 325 MG tablet Take 650 mg by mouth every 6 (six) hours as needed for mild pain, moderate pain or fever.   Yes [provider]  albuterol (PROVENTIL HFA;VENTOLIN HFA) 108 (90 Base) MCG/ACT inhaler Inhale 2 puffs into the lungs every 6 (six) hours as needed for wheezing or shortness of breath.   Yes [provider]  amiodarone (PACERONE) 200 MG tablet Take 1 tablet (200 mg total) by mouth daily. 10/10/18  Yes Wieting, Richard, MD  apixaban (ELIQUIS) 5 MG TABS tablet Take 5 mg by mouth 2 (two) times daily. 07/16/17  Yes [provider]  atorvastatin (LIPITOR) 40 MG tablet Take 40 mg by mouth at bedtime.    Yes [provider]  Carboxymethylcellulose Sod PF (REFRESH PLUS) 0.5 % SOLN Place 1 drop into both eyes 3 (three) times daily as needed (dryness).   Yes [provider]  clotrimazole (LOTRIMIN) 1 % cream Apply 1 application topically 2 (two) times daily.   Yes [provider]  docusate sodium (COLACE) 100 MG capsule Take 200 mg by mouth daily.   Yes [provider]  ferrous sulfate 325 (65 FE) MG tablet Take 325 mg  by mouth daily with breakfast.   Yes [provider]  guaifenesin (ROBITUSSIN) 100 MG/5ML syrup Take 100 mg by mouth every 4 (four) hours as needed for cough.   Yes [provider]  levothyroxine (SYNTHROID, LEVOTHROID) 100 MCG tablet Take 100 mcg by mouth daily before breakfast.   Yes [provider]  LORazepam (ATIVAN) 0.5 MG tablet Take 0.5 tablets (0.25 mg total) by  mouth 2 (two) times daily as needed for anxiety (agitation). 10/10/18  Yes Wieting, Richard, MD  Melatonin 3 MG TABS Take 3 mg by mouth at bedtime as needed (sleep).   Yes [provider]  metoprolol succinate (TOPROL-XL) 50 MG 24 hr tablet Take 1 tablet (50 mg total) by mouth daily. Take with or immediately following a meal. 10/10/18  Yes Wieting, Richard, MD  polyethylene glycol (MIRALAX / GLYCOLAX) packet Take 17 g by mouth daily as needed for mild constipation or moderate constipation.   Yes [provider]  senna-docusate (SENOKOT-S) 8.6-50 MG tablet Take 2 tablets by mouth 2 (two) times daily as needed for mild constipation or moderate constipation.   Yes [provider]  Tiotropium Bromide-Olodaterol (STIOLTO RESPIMAT) 2.5-2.5 MCG/ACT AERS Inhale 2 puffs into the lungs daily.   Yes [provider]  torsemide (DEMADEX) 20 MG tablet Take 40 mg by mouth daily.   Yes [provider]    Inpatient Medications:  . budesonide (PULMICORT) nebulizer solution  0.5 mg Nebulization BID  . chlorhexidine gluconate (MEDLINE KIT)  15 mL Mouth Rinse BID  . insulin aspart  0-9 Units Subcutaneous Q4H  . ipratropium-albuterol  3 mL Nebulization Q6H  . mouth rinse  15 mL Mouth Rinse 10 times per day  . methylPREDNISolone (SOLU-MEDROL) injection  40 mg Intravenous Q12H  . potassium chloride  20 mEq Oral Once   . ceFEPime (MAXIPIME) IV Stopped (10/17/18 0106)  . famotidine (PEPCID) IV Stopped (10/16/18 1834)  . heparin 1,250 Units/hr (10/17/18 9678)  . norepinephrine (LEVOPHED) Adult infusion Stopped (10/17/18 0045)  . propofol (DIPRIVAN) infusion 35 mcg/kg/min (10/17/18 0600)  . [START ON 10/18/2018] vancomycin      Allergies: No Known Allergies  Social History   Socioeconomic History  . Marital status: Single    Spouse name: Not on file  . Number of children: Not on file  . Years of education: Not on file  . Highest education level: Not on file   Occupational History  . Not on file  Social Needs  . Financial resource strain: Not on file  . Food insecurity:    Worry: Not on file    Inability: Not on file  . Transportation needs:    Medical: Not on file    Non-medical: Not on file  Tobacco Use  . Smoking status: Former Smoker    Types: Cigarettes  . Smokeless tobacco: Never Used  Substance and Sexual Activity  . Alcohol use: No    Frequency: Never  . Drug use: No  . Sexual activity: Not on file  Lifestyle  . Physical activity:    Days per week: Not on file    Minutes per session: Not on file  . Stress: Not on file  Relationships  . Social connections:    Talks on phone: Not on file    Gets together: Not on file    Attends religious service: Not on file    Active member of club or organization: Not on file    Attends meetings of clubs or organizations: Not on  file    Relationship status: Not on file  . Intimate partner violence:    Fear of current or ex partner: Not on file    Emotionally abused: Not on file    Physically abused: Not on file    Forced sexual activity: Not on file  Other Topics Concern  . Not on file  Social History Narrative  . Not on file     Family History  Problem Relation Age of Onset  . Diabetes Brother   . CAD Mother   . CAD Father   . Breast cancer Sister      Review of Systems m cannot assess today due to intubation and sedation  labs: Recent Labs    10/16/18 1200 10/16/18 1759 10/16/18 2314  TROPONINI 0.04* 0.03* 0.04*   Lab Results  Component Value Date   WBC 16.7 (H) 10/17/2018   HGB 11.0 (L) 10/17/2018   HCT 38.2 10/17/2018   MCV 75.0 (L) 10/17/2018   PLT 216 10/17/2018    Recent Labs  Lab 10/16/18 1200 10/17/18 0409  NA 144 146*  K 4.5 3.2*  CL 101 103  CO2 38* 35*  BUN 31* 35*  CREATININE 1.65* 1.67*  CALCIUM 8.7* 8.3*  PROT 7.4  --   BILITOT 0.8  --   ALKPHOS 94  --   ALT 10  --   AST 21  --   GLUCOSE 150* 101*   Lab Results  Component Value  Date   TRIG 70 10/16/2018   No results found for: DDIMER  Radiology/Studies:  Dg Chest 2 View  Result Date: 10/09/2018 CLINICAL DATA:  Dyspnea EXAM: CHEST - 2 VIEW COMPARISON:  10/06/2018 FINDINGS: Interval decrease in left-sided pleural effusion. Blunting of the posterior costophrenic angles from small bilateral pleural effusions are now identified. Cardiomegaly with interstitial edema and pulmonary vascular redistribution is redemonstrated without significant interval change. Nonaneurysmal atherosclerosis of the aortic arch. Thoracic spondylosis is noted. No acute osseous abnormality is seen. IMPRESSION: Cardiomegaly with interstitial edema and small bilateral pleural effusions. Interval decrease in left-sided pleural effusion. Electronically Signed   By: Ashley Royalty M.D.   On: 10/09/2018 01:28   Dg Abd 1 View  Result Date: 10/16/2018 CLINICAL DATA:  OG tube placement. EXAM: ABDOMEN - 1 VIEW COMPARISON:  CT 08/19/2011. FINDINGS: OG tube noted with tip and side hole over the stomach. Pacing wire noted the right ventricle. Surgical catheter noted over the right abdomen. Questionable distended loop of colon with possible thickening of the colonic wall noted. Abdominal series can be obtained for further evaluation. Left nephrolithiasis again noted. IMPRESSION: 1.  NG tube noted with tip and side hole of the stomach. 2. Questionable slightly distended loop of colon with possible thickening of the colonic wall noted. Abdominal series can be obtained for further evaluation. 3.  Left nephrolithiasis again noted. Electronically Signed   By: Marcello Moores  Register   On: 10/16/2018 16:30   Ct Head Wo Contrast  Result Date: 10/16/2018 CLINICAL DATA:  78 year old female found unresponsive, intubated. EXAM: CT HEAD WITHOUT CONTRAST TECHNIQUE: Contiguous axial images were obtained from the base of the skull through the vertex without intravenous contrast. COMPARISON:  None. FINDINGS: Brain: Cerebral volume is within  normal limits for age. No midline shift, mass effect, or evidence of intracranial mass lesion. No ventriculomegaly. No acute intracranial hemorrhage identified. There are small age indeterminate infarcts in the posterior left cerebellum (series 5, image 29), anterior right middle frontal gyrus (coronal image 20), and thalami (greater  on the left series 3, image 16). No associated mass effect. Other patchy bilateral cerebral white matter hypodensity. Vascular: Mild Calcified atherosclerosis at the skull base. No suspicious intracranial vascular hyperdensity. Skull: No acute osseous abnormality identified. Sinuses/Orbits: Bilateral mucosal thickening and opacification with some sinus fluid levels. The tympanic cavities and mastoids remain well pneumatized. Other: Fluid in the visible pharynx and nasal cavity in the setting of intubation. No acute orbit or scalp soft tissue findings. IMPRESSION: 1. No prior study for comparison. 2. Age indeterminate small infarcts in the left cerebellum, the anterior right frontal lobe, and thalami. 3. No associated mass effect.  No acute intracranial hemorrhage. Electronically Signed   By: Genevie Ann M.D.   On: 10/16/2018 16:59   [place] Chest (pleural Effusion)  Result Date: 10/07/2018 CLINICAL DATA:  Acute on chronic respiratory failure. Evaluate for pleural fluid and thoracentesis. EXAM: CHEST ULTRASOUND COMPARISON:  Chest radiograph 10/06/2018 FINDINGS: Both sides of the chest were evaluated with ultrasound. A small amount of subpulmonic pleural fluid seen on both sides. Fluid was not amendable for thoracentesis due to overlying lung. IMPRESSION: Small amount of pleural fluid bilaterally. Fluid is not amendable for thoracentesis. Electronically Signed   By: Markus Daft M.D.   On: 10/07/2018 10:58   Dg Chest Port 1 View  Result Date: 10/17/2018 CLINICAL DATA:  Acute respiratory failure EXAM: PORTABLE CHEST 1 VIEW COMPARISON:  10/16/2018 FINDINGS: Endotracheal tube terminates 3 cm  above the carina. Mild patchy bilateral lower lobe opacities, suspicious for pneumonia. Possible small left pleural effusion. No pneumothorax. Cardiomegaly.  Left subclavian pacemaker. IMPRESSION: Endotracheal tube terminates 3 cm above the carina. Mild patchy bilateral lower lobe opacities, suspicious for pneumonia. Possible small left pleural effusion. Electronically Signed   By: Julian Hy M.D.   On: 10/17/2018 03:41   Dg Chest Portable 1 View  Result Date: 10/16/2018 CLINICAL DATA:  Intubation, respiratory arrest EXAM: PORTABLE CHEST 1 VIEW COMPARISON:  October 09, 2018 FINDINGS: Endotracheal tube is identified distal tip 5 mm from carina. Retraction by 2 cm is recommended. A nasogastric tube is identified with distal tip not included on film but is at least in the stomach. Cardiac pacemaker is identified unchanged compared prior exam. The heart size is enlarged. Patchy consolidation of left lung base is identified. There is pulmonary edema. There is no pneumothorax. IMPRESSION: Endotracheal tube is identified with distal tip 5 mm from carina. Retraction by 2 cm is recommended. There is no pneumothorax. Congestive heart failure. Patchy consolidation of left lung base suspicious for pneumonia. These results will be called to the ordering clinician or representative by the Radiologist Assistant, and communication documented in the PACS or zVision Dashboard. Electronically Signed   By: Abelardo Diesel M.D.   On: 10/16/2018 13:23   Dg Chest Port 1 View  Result Date: 10/09/2018 CLINICAL DATA:  Post pacemaker insertion EXAM: PORTABLE CHEST 1 VIEW COMPARISON:  Chest x-ray of 10/08/2018 FINDINGS: A single lead permanent pacemaker is now present. No complicating features are seen. There is cardiomegaly present with opacity at the left lung base consistent with effusion, pleural thickening, but pneumonia can not be excluded. No evidence of edema is noted. The right lung is clear. No pneumothorax is seen.  IMPRESSION: 1. New single lead permanent pacemaker is now present. No complicating features. 2. Stable cardiomegaly.  No edema is seen. 3. No change in opacity at the left lung base. Electronically Signed   By: Ivar Drape M.D.   On: 10/09/2018 15:16   Dg  Chest Portable 1 View  Result Date: 10/06/2018 CLINICAL DATA:  Shortness of breath and bradycardia. EXAM: PORTABLE CHEST 1 VIEW COMPARISON:  09/12/2018 FINDINGS: There is a large left pleural effusion which appears increased in volume compared with the previous exam. No significant right effusion identified. Mild diffuse edema identified. IMPRESSION: 1. Significant increase in volume of left pleural effusion. 2. Pulmonary edema. Electronically Signed   By: Kerby Moors M.D.   On: 10/06/2018 14:48   Dg C-arm 1-60 Min-no Report  Result Date: 10/09/2018 Fluoroscopy was utilized by the requesting physician.  No radiographic interpretation.    EKG: Sinus tachycardia with bundle branch block  Weights: Filed Weights   10/16/18 1317  Weight: 109.8 kg     Physical Exam: Blood pressure 129/60, pulse (!) 49, temperature 99.3 F (37.4 C), resp. rate 15, weight 109.8 kg, SpO2 96 %. Body mass index is 47.28 kg/m. General: Well developed, well nourished, in no acute distress. Head eyes ears nose throat: Normocephalic, atraumatic, sclera non-icteric, no xanthomas, nares are without discharge. No apparent thyromegaly and/or mass  Lungs: Normal respiratory effort.  Diffuse wheezes, no rales, no rhonchi.  Heart: RRR with normal S1 S2. no murmur gallop, no rub, PMI is normal size and placement, carotid upstroke normal without bruit, jugular venous pressure is normal Abdomen: Soft, non-tender, non-distended with normoactive bowel sounds. No hepatomegaly. No rebound/guarding. No obvious abdominal masses. Abdominal aorta is normal size without bruit Extremities: Trace edema. no cyanosis, no clubbing, no ulcers  Peripheral : 2+ bilateral upper extremity  pulses, 2+ bilateral femoral pulses, 2+ bilateral dorsal pedal pulse Neuro: Not alert and oriented. No facial asymmetry. No focal deficit. Moves all extremities spontaneously. Musculoskeletal: Normal muscle tone without kyphosis Psych: Does not responds to questions appropriately with a normal affect.    Assessment: 78 year old female with known history of sick sinus syndrome essential hypertension mixed hyperlipidemia chronic kidney disease COPD with exacerbation and respiratory failure with elevated troponin consistent with demand ischemia and no current acute coronary syndrome  Plan: 1.  Continue supportive care for respiratory failure COPD and infection sepsis 2.  No further cardiac intervention at this time due to no evidence of heart failure or myocardial infarction 3.  Continue to follow closely for any changes in sick sinus syndrome requiring further additional medication management or adjustments 4.  Proceed on with treatment of respiratory failure without restriction  Signed, Corey Skains M.D. [facility] 10/17/2018, 8:54 AM

## 2018-10-17 NOTE — Progress Notes (Signed)
CRITICAL CARE NOTE  CC  follow up respiratory failure  SUBJECTIVE Intubated and sedated,arousable and fiollows commands Patient remains critically ill Prognosis is guarded     SIGNIFICANT EVENTS t max 15f   BP (!) 144/62   Pulse 60   Temp 99.3 F (37.4 C)   Resp 15   Wt 109.8 kg   SpO2 98%   BMI 47.28 kg/m    REVIEW OF SYSTEMS  PATIENT IS UNABLE TO PROVIDE COMPLETE REVIEW OF SYSTEMS DUE TO SEVERE CRITICAL ILLNESS   PHYSICAL EXAMINATION:  GENERAL:critically ill appearing, no resp distress HEAD: Normocephalic, atraumatic.  EYES: Pupils equal, round, reactive to light.  No scleral icterus.  MOUTH: Moist mucosal membrane. NECK: Supple. No thyromegaly. No nodules. No JVD.  PULMONARY: +rhonchi, with basal crackles CARDIOVASCULAR: S1 and S2. Regular rate and rhythm. No murmurs, rubs, or gallops.  GASTROINTESTINAL: Soft, nontender, -distended. No masses. Positive bowel sounds. No hepatosplenomegaly.  MUSCULOSKELETAL:+ edema with mild erythema bilaterally NEUROLOGIC: sedated but opens eyes and follows simple commands SKIN:intact,warm,dry  INTAKE/OUTPUT  Intake/Output Summary (Last 24 hours) at 10/17/2018 0959 Last data filed at 10/17/2018 0953 Gross per 24 hour  Intake 732.8 ml  Output 1300 ml  Net -567.2 ml    LABS  CBC Recent Labs  Lab 10/16/18 1200 10/17/18 0526  WBC 23.7* 16.7*  HGB 13.2 11.0*  HCT 50.1* 38.2  PLT 275 216   Coag's Recent Labs  Lab 10/16/18 1200 10/17/18 0409  APTT 37* 38*  INR 1.86 1.75   BMET Recent Labs  Lab 10/16/18 1200 10/17/18 0409  NA 144 146*  K 4.5 3.2*  CL 101 103  CO2 38* 35*  BUN 31* 35*  CREATININE 1.65* 1.67*  GLUCOSE 150* 101*   Electrolytes Recent Labs  Lab 10/16/18 1200 10/17/18 0409  CALCIUM 8.7* 8.3*   Sepsis Markers Recent Labs  Lab 10/16/18 1200 10/16/18 1856 10/16/18 1859 10/17/18 0409  LATICACIDVEN 1.1 1.3  --   --   PROCALCITON  --   --  <0.10 0.11   ABG Recent Labs  Lab  10/16/18 2237 10/17/18 0420  PHART 7.51* 7.51*  PCO2ART 48 47  PO2ART 102 140*   Liver Enzymes Recent Labs  Lab 10/16/18 1200  AST 21  ALT 10  ALKPHOS 94  BILITOT 0.8  ALBUMIN 3.8   Cardiac Enzymes Recent Labs  Lab 10/16/18 1200 10/16/18 1759 10/16/18 2314  TROPONINI 0.04* 0.03* 0.04*   Glucose Recent Labs  Lab 10/16/18 1524 10/16/18 1753 10/16/18 2033 10/16/18 2346 10/17/18 0357 10/17/18 0739  GLUCAP 116* 121* 120* 110* 103* 96     Recent Results (from the past 240 hour(s))  Blood culture (routine x 2)     Status: None (Preliminary result)   Collection Time: 10/16/18 12:24 PM  Result Value Ref Range Status   Specimen Description BLOOD LEFT AC  Final   Special Requests   Final    BOTTLES DRAWN AEROBIC AND ANAEROBIC Blood Culture adequate volume   Culture   Final    NO GROWTH < 24 HOURS [facility] [address] [place] [place] [address]   Report Status PENDING  Incomplete  Blood culture (routine x 2)     Status: None (Preliminary result)   Collection Time: 10/16/18 12:25 PM  Result Value Ref Range Status   Specimen Description BLOOD LEFT ARM  Final   Special Requests   Final    BOTTLES DRAWN AEROBIC AND ANAEROBIC Blood Culture adequate volume  Culture   Final    NO GROWTH < 24 HOURS Performed at [facility], [address] [place], [place] [address]    Report Status PENDING  Incomplete  Culture, respiratory (non-expectorated)     Status: None (Preliminary result)   Collection Time: 10/16/18  5:07 PM  Result Value Ref Range Status   Specimen Description   Final    TRACHEAL ASPIRATE Performed at [facility], [address], [place], [place] [address]    Special Requests   Final    NONE Performed at [facility], [address] [place], [place] [address]    Gram Stain   Final    ABUNDANT WBC PRESENT,BOTH PMN AND MONONUCLEAR NO ORGANISMS SEEN    Culture   Final    NO GROWTH < 24  HOURS Performed at [facility], [place] [address], [place], [place] [address]    Report Status PENDING  Incomplete    MEDICATIONS   Current Facility-Administered Medications:  .  budesonide (PULMICORT) nebulizer solution 0.5 mg, 0.5 mg, Nebulization, BID, Awilda Bill, NP, 0.5 mg at 10/17/18 0734 .  ceFEPIme (MAXIPIME) 2 g in sodium chloride 0.9 % 100 mL IVPB, 2 g, Intravenous, Q12H, Shari Prows, RPH, Stopped at 10/17/18 0106 .  chlorhexidine gluconate (MEDLINE KIT) (PERIDEX) 0.12 % solution 15 mL, 15 mL, Mouth Rinse, BID, Blakeney, Dreama Saa, NP, 15 mL at 10/17/18 0713 .  dexmedetomidine (PRECEDEX) 400 MCG/100ML (4 mcg/mL) infusion, 0.4-1.2 mcg/kg/hr, Intravenous, Titrated, Rosine Door, MD, Last Rate: 16.47 mL/hr at 10/17/18 0953, 0.6 mcg/kg/hr at 10/17/18 0953 .  famotidine (PEPCID) IVPB 20 mg premix, 20 mg, Intravenous, Q24H, Blakeney, Dreama Saa, NP, Stopped at 10/16/18 1834 .  heparin ADULT infusion 100 units/mL (25000 units/232m sodium chloride 0.45%), 1,250 Units/hr, Intravenous, Continuous, Mody, Sital, MD, Last Rate: 12.5 mL/hr at 10/17/18 0953, 1,250 Units/hr at 10/17/18 0953 .  hydrALAZINE (APRESOLINE) injection 10-20 mg, 10-20 mg, Intravenous, Q4H PRN, Blakeney, Dana G, NP .  insulin aspart (novoLOG) injection 0-9 Units, 0-9 Units, Subcutaneous, Q4H, BAwilda Bill NP, 1 Units at 10/16/18 1807 .  ipratropium-albuterol (DUONEB) 0.5-2.5 (3) MG/3ML nebulizer solution 3 mL, 3 mL, Nebulization, Q6H, Blakeney, Dana G, NP, 3 mL at 10/17/18 0735 .  ipratropium-albuterol (DUONEB) 0.5-2.5 (3) MG/3ML nebulizer solution 3 mL, 3 mL, Nebulization, Q6H PRN, BAwilda Bill NP .  MEDLINE mouth rinse, 15 mL, Mouth Rinse, 10 times per day, BAwilda Bill NP, 15 mL at 10/17/18 0939 .  methylPREDNISolone sodium succinate (SOLU-MEDROL) 40 mg/mL injection 40 mg, 40 mg, Intravenous, Q12H, Blakeney, Dana G, NP, 40 mg at 10/17/18 0033 .  norepinephrine (LEVOPHED) 456min NS 25090mpremix infusion, 0-40 mcg/min, Intravenous, Titrated, KeeBradly BienenstockP, Stopped at 10/17/18 0045 .  ondansetron (ZOFRAN) tablet 4 mg, 4 mg, Oral, Q6H PRN **OR** ondansetron (ZOFRAN) injection 4 mg, 4 mg, Intravenous, Q6H PRN, Gouru, Aruna, MD .  potassium chloride 20 MEQ/15ML (10%) solution 20 mEq, 20 mEq, Oral, Once, KeeDarel Hong NP .  propofol (DIPRIVAN) 1000 MG/100ML infusion, 5-50 mcg/kg/min, Intravenous, Titrated, SimWilhelmina McardleD, Stopped at 10/17/18 [id number] [phone] [START ON 10/18/2018] vancomycin (VANCOCIN) 1,250 mg in sodium chloride 0.9 % 250 mL IVPB, 1,250 mg, Intravenous, Q48H, Nesbitt, Christopher A, RPH      Indwelling Urinary Catheter continued, requirement due to   Reason to continue Indwelling Urinary Catheter for strict Intake/Output monitoring for hemodynamic instability   Central Line continued, requirement due to  Reason to continue [employer] Monitoring of central venous pressure or other hemodynamic parameters   Ventilator continued, requirement due to, resp failure    Ventilator Sedation RASS 0 to -2     cxr  Bilateral basal patchy infilterates with ? 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%, mod TR Elevated troponin likely secondary to demand ischemia in setting of respiratory failure  HTN Hx: Atrial Flutter and Pacemaker Continuous telemetry monitoring Trend troponin's Prn hydralazine for bp management Consider lasix if BNP high  Heparin gtt  Trend CBC Monitor for s/sx of bleeding and transfuse for hgb <7  Acute on chronic renal failure  Trend BMP  Replace electrolytes as indicated  Monitor UOP Avoid nephrotoxic medications   Pneumonia   Trend WBC and monitor fever curve  WBC decreasing, fever persists Trend PCT  Follow cultures  Continue cefepime ,stop  vancomycin  MRSA PCR negative   Hyperglycemia  CBG's q4hrs  SSI   Acute encephalopathy of unknown etiology  Mechanical ventilation pain/discomfort  MS is better. CT showed small strokes of undetermined duration ? old EEG pending If mentation does not improve will consult neurology      Critical Care Time devoted to patient care services described in this note is 35  minutes.   Overall, patient is critically ill, prognosis is guarded.  Patient with Multiorgan failure and at high risk for cardiac arrest and death.   Rosine Door, MD  10-20-2018 9:59 AM [facility]

## 2018-10-17 NOTE — Progress Notes (Signed)
Initial Nutrition Assessment  DOCUMENTATION CODES:   Morbid obesity  INTERVENTION:   If pt does not extubate recommend Vital HP @ 26ml/hr  Free water flushes 59ml q 4 hours for tube patency   Regimen provides 1200kcal/day, 105g/day protein, 1149ml/day free water   Recommend liquid MVI daily via tube   NUTRITION DIAGNOSIS:   Inadequate oral intake related to inability to eat(pt sedated and ventilated ) as evidenced by NPO status.  GOAL:   Provide needs based on ASPEN/SCCM guidelines  MONITOR:   Vent status, Labs, Weight trends, I & O's  REASON FOR ASSESSMENT:   Ventilator    ASSESSMENT:   78 yo female admitted with acute encephalopathy of unknown etiology and acute on chronic respiratory failure secondary to vascular congestion, AECOPD, and pneumonia requiring mechanical intubation   Pt ventilated; is alert today and able to write on pad. Propofol stopped. Plan is to possibly extubate. OGT in place. Per chart, pt with 11lb(4%) wt loss since May. RD unsure how recently weight loss occurred but this would only be significant if lost in the last month. Tube feed recommendations above if pt does not extubate.   Medications reviewed and include: insulin, solu-medrol, cefepime, precedex, pepcid, levophed  Labs reviewed: Na 146(H), K 3.2(L), BUN 35(H), creat 1.67(H) BNP 1484(H) Wbc- 16.7(H), MCV 75(L), MCH 21.6(L), MCHC 28.8(L) cbgs- 121, 120, 110, 103, 96 x 24 hrs AIC 5.8(H)- 12/13  Patient is currently intubated on ventilator support MV: 9.8 L/min Temp (24hrs), Avg:97.2 F (36.2 C), Min:95.9 F (35.5 C), Max:101.1 F (38.4 C)  Propofol: stopped this morning   MAP- >74mmHg   UOP- x 24 hrs  NUTRITION - FOCUSED PHYSICAL EXAM:    Most Recent Value  Orbital Region  No depletion  Upper Arm Region  No depletion  Thoracic and Lumbar Region  No depletion  Buccal Region  No depletion  Temple Region  No depletion  Clavicle Bone Region  No depletion  Clavicle  and Acromion Bone Region  No depletion  Scapular Bone Region  No depletion  Dorsal Hand  No depletion  Patellar Region  No depletion  Anterior Thigh Region  No depletion  Posterior Calf Region  No depletion  Edema (RD Assessment)  Severe  Hair  Reviewed  Eyes  Reviewed  Mouth  Reviewed  Skin  Reviewed  Nails  Reviewed     Diet Order:   Diet Order            Diet NPO time specified  Diet effective now             EDUCATION NEEDS:   Not appropriate for education at this time  Skin:  Skin Assessment: Reviewed RN Assessment(cellulitis, incision chest )  Last BM:  pta  Height:   Ht Readings from Last 1 Encounters:  10/07/18 5' (1.524 m)    Weight:   Wt Readings from Last 1 Encounters:  10/16/18 109.8 kg    Ideal Body Weight:  45.4 kg  BMI:  Body mass index is 47.28 kg/m.  Estimated Nutritional Needs:   Kcal:  1208-1537kcal/day   Protein:  >91g/day   Fluid:  1.4L/day or per MD  Betsey Holiday MS, RD, LDN Pager #- [phone] Office#- [phone] After Hours Pager: [phone]

## 2018-10-17 NOTE — Progress Notes (Signed)
[employer] [facility] [facility] [place] [facility]   PATIENT NAME: Danielle Munoz    MR#:  [id number]  DATE OF BIRTH:  1940/11/17  SUBJECTIVE:   patient intubated but alert on vent  REVIEW OF SYSTEMS:    Unable patient is intubated   Tolerating Diet: npo      DRUG ALLERGIES:  No Known Allergies  VITALS:  Blood pressure (!) 144/62, pulse 60, temperature 99.3 F (37.4 C), resp. rate 15, weight 109.8 kg, SpO2 98 %.  PHYSICAL EXAMINATION:  Constitutional: Appears well-developed and well-nourished. No distress.intubated alert and awake HENT: Normocephalic. .  Eyes: Conjunctivae and EOM are normal. PERRLA, no scleral icterus.  Neck: Normal ROM. Neck supple. No JVD. No tracheal deviation. CVS: RRR, S1/S2 +, no murmurs, no gallops, no carotid bruit.  Pulmonary: Effort and breath sounds normal, no stridor, rhonchi, wheezes, rales.  Abdominal: Soft. BS +,  no distension, tenderness, rebound or guarding.  Musculoskeletal: Normal range of motion. 2+ LEE.  Neuro: Alert. CN 2-12 grossly intact. No focal deficits. Skin: Skin is warm and dry. No rash noted.       LABORATORY PANEL:   CBC Recent Labs  Lab 10/17/18 0526  WBC 16.7*  HGB 11.0*  HCT 38.2  PLT 216   ------------------------------------------------------------------------------------------------------------------  Chemistries  Recent Labs  Lab 10/16/18 1200 10/17/18 0409  NA 144 146*  K 4.5 3.2*  CL 101 103  CO2 38* 35*  GLUCOSE 150* 101*  BUN 31* 35*  CREATININE 1.65* 1.67*  CALCIUM 8.7* 8.3*  AST 21  --   ALT 10  --   ALKPHOS 94  --   BILITOT 0.8  --    ------------------------------------------------------------------------------------------------------------------  Cardiac Enzymes Recent Labs  Lab 10/16/18 1200 10/16/18 1759 10/16/18 2314  TROPONINI 0.04* 0.03* 0.04*    ------------------------------------------------------------------------------------------------------------------  RADIOLOGY:  Dg Abd 1 View  Result Date: 10/16/2018 CLINICAL DATA:  OG tube placement. EXAM: ABDOMEN - 1 VIEW COMPARISON:  CT 08/19/2011. FINDINGS: OG tube noted with tip and side hole over the stomach. Pacing wire noted the right ventricle. Surgical catheter noted over the right abdomen. Questionable distended loop of colon with possible thickening of the colonic wall noted. Abdominal series can be obtained for further evaluation. Left nephrolithiasis again noted. IMPRESSION: 1.  NG tube noted with tip and side hole of the stomach. 2. Questionable slightly distended loop of colon with possible thickening of the colonic wall noted. Abdominal series can be obtained for further evaluation. 3.  Left nephrolithiasis again noted. Electronically Signed   By: Maisie Fus  Register   On: 10/16/2018 16:30   Ct Head Wo Contrast  Result Date: 10/16/2018 CLINICAL DATA:  78 year old female found unresponsive, intubated. EXAM: CT HEAD WITHOUT CONTRAST TECHNIQUE: Contiguous axial images were obtained from the base of the skull through the vertex without intravenous contrast. COMPARISON:  None. FINDINGS: Brain: Cerebral volume is within normal limits for age. No midline shift, mass effect, or evidence of intracranial mass lesion. No ventriculomegaly. No acute intracranial hemorrhage identified. There are small age indeterminate infarcts in the posterior left cerebellum (series 5, image 29), anterior right middle frontal gyrus (coronal image 20), and thalami (greater on the left series 3, image 16). No associated mass effect. Other patchy bilateral cerebral white matter hypodensity. Vascular: Mild Calcified atherosclerosis at the skull base. No suspicious intracranial vascular hyperdensity. Skull: No acute osseous abnormality identified. Sinuses/Orbits: Bilateral mucosal thickening and opacification with some  sinus fluid levels. The tympanic cavities and mastoids remain well pneumatized. Other: Fluid  in the visible pharynx and nasal cavity in the setting of intubation. No acute orbit or scalp soft tissue findings. IMPRESSION: 1. No prior study for comparison. 2. Age indeterminate small infarcts in the left cerebellum, the anterior right frontal lobe, and thalami. 3. No associated mass effect.  No acute intracranial hemorrhage. Electronically Signed   By: Odessa FlemingH  Hall M.D.   On: 10/16/2018 16:59   Dg Chest Port 1 View  Result Date: 10/17/2018 CLINICAL DATA:  Acute respiratory failure EXAM: PORTABLE CHEST 1 VIEW COMPARISON:  10/16/2018 FINDINGS: Endotracheal tube terminates 3 cm above the carina. Mild patchy bilateral lower lobe opacities, suspicious for pneumonia. Possible small left pleural effusion. No pneumothorax. Cardiomegaly.  Left subclavian pacemaker. IMPRESSION: Endotracheal tube terminates 3 cm above the carina. Mild patchy bilateral lower lobe opacities, suspicious for pneumonia. Possible small left pleural effusion. Electronically Signed   By: Charline BillsSriyesh  Krishnan M.D.   On: 10/17/2018 03:41   Dg Chest Portable 1 View  Result Date: 10/16/2018 CLINICAL DATA:  Intubation, respiratory arrest EXAM: PORTABLE CHEST 1 VIEW COMPARISON:  October 09, 2018 FINDINGS: Endotracheal tube is identified distal tip 5 mm from carina. Retraction by 2 cm is recommended. A nasogastric tube is identified with distal tip not included on film but is at least in the stomach. Cardiac pacemaker is identified unchanged compared prior exam. The heart size is enlarged. Patchy consolidation of left lung base is identified. There is pulmonary edema. There is no pneumothorax. IMPRESSION: Endotracheal tube is identified with distal tip 5 mm from carina. Retraction by 2 cm is recommended. There is no pneumothorax. Congestive heart failure. Patchy consolidation of left lung base suspicious for pneumonia. These results will be called to the  ordering clinician or representative by the Radiologist Assistant, and communication documented in the PACS or zVision Dashboard. Electronically Signed   By: Sherian ReinWei-Chen  Lin M.D.   On: 10/16/2018 13:23     ASSESSMENT AND PLAN:   78 y/o female with hx of SSS s/p pacemaker, CKD stage 2, chronic combined systolic and diastolic heart failure 50 to 55% who presented to the ER with shortness of breath.   1.  Acute on chronic hypoxic respiratory failure in the setting of pneumonia, COPD exacerbation Patient intubated with plans for extubation likely today  2. COPD exacerbation: Continue Steroids, nebs   3.  Sepsis due to HCAP: Patient presented with hypotension and hypothermia  Continue sepsis protocol   continue Cefepime 4.  Chronic atrial flutter: Continue Eliquis  5.  Elevated troponin due to demand ischemia.  Patient has been ruled out for ACS.  Patient evaluated by cardiology team. 6.  History of sick sinus syndrome status post recent pacemaker placement  7.  Hypokalemia: Replete    Management plans discussed with the patient   CODE STATUS: full  TOTAL TIME TAKING CARE OF THIS PATIENT: 30 minutes.     POSSIBLE D/C 2 days, DEPENDING ON CLINICAL CONDITION.   Tomorrow Dehaas M.D on 10/17/2018 at 10:39 AM  Between 7am to 6pm - Pager - [phone] After 6pm go to [email or web address] - password [email or web address]  [facility]  Office  [phone]  CC: Primary care physician; Clinic, [facility]  Note: This dictation was prepared with Dragon dictation along with smaller phrase technology. Any transcriptional errors that result from this process are unintentional.

## 2018-10-18 ENCOUNTER — Inpatient Hospital Stay: Payer: Medicare Other

## 2018-10-18 LAB — CBC WITH DIFFERENTIAL/PLATELET
Abs Immature Granulocytes: 0.31 10*3/uL — ABNORMAL HIGH (ref 0.00–0.07)
Basophils Absolute: 0 10*3/uL (ref 0.0–0.1)
Basophils Relative: 0 %
Eosinophils Absolute: 0 10*3/uL (ref 0.0–0.5)
Eosinophils Relative: 0 %
HCT: 44.3 % (ref 36.0–46.0)
Hemoglobin: 12.4 g/dL (ref 12.0–15.0)
Immature Granulocytes: 1 %
LYMPHS ABS: 0.3 10*3/uL — AB (ref 0.7–4.0)
Lymphocytes Relative: 1 %
MCH: 22 pg — ABNORMAL LOW (ref 26.0–34.0)
MCHC: 28 g/dL — ABNORMAL LOW (ref 30.0–36.0)
MCV: 78.7 fL — ABNORMAL LOW (ref 80.0–100.0)
Monocytes Absolute: 0.4 10*3/uL (ref 0.1–1.0)
Monocytes Relative: 2 %
NEUTROS PCT: 96 %
Neutro Abs: 22.7 10*3/uL — ABNORMAL HIGH (ref 1.7–7.7)
Platelets: 220 10*3/uL (ref 150–400)
RBC: 5.63 MIL/uL — ABNORMAL HIGH (ref 3.87–5.11)
RDW: 25 % — ABNORMAL HIGH (ref 11.5–15.5)
Smear Review: NORMAL
WBC: 23.8 10*3/uL — ABNORMAL HIGH (ref 4.0–10.5)
nRBC: 0 % (ref 0.0–0.2)

## 2018-10-18 LAB — BASIC METABOLIC PANEL
Anion gap: 8 (ref 5–15)
BUN: 41 mg/dL — ABNORMAL HIGH (ref 8–23)
CALCIUM: 8.9 mg/dL (ref 8.9–10.3)
CO2: 35 mmol/L — AB (ref 22–32)
Chloride: 104 mmol/L (ref 98–111)
Creatinine, Ser: 1.25 mg/dL — ABNORMAL HIGH (ref 0.44–1.00)
GFR calc non Af Amer: 42 mL/min — ABNORMAL LOW (ref >60)
GFR, EST AFRICAN AMERICAN: 48 mL/min — AB (ref >60)
Glucose, Bld: 124 mg/dL — ABNORMAL HIGH (ref 70–99)
Potassium: 3.7 mmol/L (ref 3.5–5.1)
Sodium: 147 mmol/L — ABNORMAL HIGH (ref 135–145)

## 2018-10-18 LAB — GLUCOSE, CAPILLARY
GLUCOSE-CAPILLARY: 103 mg/dL — AB (ref 70–99)
Glucose-Capillary: 101 mg/dL — ABNORMAL HIGH (ref 70–99)
Glucose-Capillary: 104 mg/dL — ABNORMAL HIGH (ref 70–99)
Glucose-Capillary: 109 mg/dL — ABNORMAL HIGH (ref 70–99)
Glucose-Capillary: 91 mg/dL (ref 70–99)
Glucose-Capillary: 97 mg/dL (ref 70–99)

## 2018-10-18 MED ORDER — FUROSEMIDE 10 MG/ML IJ SOLN
20.0000 mg | Freq: Once | INTRAMUSCULAR | Status: AC
  Administered 2018-10-18: 20 mg via INTRAVENOUS
  Filled 2018-10-18: qty 2

## 2018-10-18 MED ORDER — IPRATROPIUM-ALBUTEROL 0.5-2.5 (3) MG/3ML IN SOLN
3.0000 mL | RESPIRATORY_TRACT | Status: DC
  Administered 2018-10-18 – 2018-10-19 (×8): 3 mL via RESPIRATORY_TRACT
  Filled 2018-10-18 (×8): qty 3

## 2018-10-18 MED ORDER — FUROSEMIDE 10 MG/ML IJ SOLN
40.0000 mg | Freq: Once | INTRAMUSCULAR | Status: AC
  Administered 2018-10-18: 40 mg via INTRAVENOUS
  Filled 2018-10-18: qty 4

## 2018-10-18 MED ORDER — DEXTROSE 5 % IV SOLN
INTRAVENOUS | Status: DC
  Administered 2018-10-18: 15:00:00 via INTRAVENOUS

## 2018-10-18 NOTE — Progress Notes (Signed)
[employer] - [place] [facility]   PATIENT NAME: Danielle Munoz    MR#:  [id number]  DATE OF BIRTH:  [birth year]  SUBJECTIVE:   Patient extubated.  Still feels short of breath  REVIEW OF SYSTEMS:    Review of Systems  Constitutional: Negative.  Negative for chills, fever and malaise/fatigue.  HENT: Negative.  Negative for ear discharge, ear pain, hearing loss, nosebleeds and sore throat.   Eyes: Negative.  Negative for blurred vision and pain.  Respiratory: Positive for cough, shortness of breath and wheezing. Negative for hemoptysis.   Cardiovascular: Positive for leg swelling. Negative for chest pain and palpitations.  Gastrointestinal: Negative.  Negative for abdominal pain, blood in stool, diarrhea, nausea and vomiting.  Genitourinary: Negative.  Negative for dysuria.  Musculoskeletal: Negative.  Negative for back pain.  Skin: Negative.   Neurological: Negative for dizziness, tremors, speech change, focal weakness, seizures and headaches.  Endo/Heme/Allergies: Negative.  Does not bruise/bleed easily.  Psychiatric/Behavioral: Negative.  Negative for depression, hallucinations and suicidal ideas.      Tolerating Diet: yes    DRUG ALLERGIES:  No Known Allergies  VITALS:  Blood pressure (!) 153/84, pulse (!) 103, temperature (!) 97 F (36.1 C), temperature source Oral, resp. rate (!) 32, weight 109.8 kg, SpO2 97 %.  PHYSICAL EXAMINATION:  Constitutional: Appears obese tachypnic Eyes: Conjunctivae are normal. PERRLA, no scleral icterus.  Neck: Normal ROM. Neck supple. No JVD. No tracheal deviation. CVS: RRR, S1/S2 +, no murmurs, no gallops, no carotid bruit.  Pulmonary: b/l rhonchii/wheezing .  Abdominal: Soft. BS +,  no distension, tenderness, rebound or guarding.  Musculoskeletal: Normal range of motion. 2+ LEE.  Neuro: Alert. CN 2-12 grossly intact. No focal deficits. Skin: Skin is warm and dry. No rash noted. Legs with erythema from edema        LABORATORY PANEL:   CBC Recent Labs  Lab 10/18/18 0355  WBC 23.8*  HGB 12.4  HCT 44.3  PLT 220   ------------------------------------------------------------------------------------------------------------------  Chemistries  Recent Labs  Lab 10/16/18 1200  10/18/18 0355  NA 144   < > 147*  K 4.5   < > 3.7  CL 101   < > 104  CO2 38*   < > 35*  GLUCOSE 150*   < > 124*  BUN 31*   < > 41*  CREATININE 1.65*   < > 1.25*  CALCIUM 8.7*   < > 8.9  AST 21  --   --   ALT 10  --   --   ALKPHOS 94  --   --   BILITOT 0.8  --   --    < > = values in this interval not displayed.   ------------------------------------------------------------------------------------------------------------------  Cardiac Enzymes Recent Labs  Lab 10/16/18 1200 10/16/18 1759 10/16/18 2314  TROPONINI 0.04* 0.03* 0.04*   ------------------------------------------------------------------------------------------------------------------  RADIOLOGY:  Dg Abd 1 View  Result Date: 10/16/2018 CLINICAL DATA:  OG tube placement. EXAM: ABDOMEN - 1 VIEW COMPARISON:  CT 08/19/2011. FINDINGS: OG tube noted with tip and side hole over the stomach. Pacing wire noted the right ventricle. Surgical catheter noted over the right abdomen. Questionable distended loop of colon with possible thickening of the colonic wall noted. Abdominal series can be obtained for further evaluation. Left nephrolithiasis again noted. IMPRESSION: 1.  NG tube noted with tip and side hole of the stomach. 2. Questionable slightly distended loop of colon with possible thickening of the colonic wall noted. Abdominal series can be obtained  for further evaluation. 3.  Left nephrolithiasis again noted. Electronically Signed   By: Maisie Fushomas  Register   On: 10/16/2018 16:30   Ct Head Wo Contrast  Result Date: 10/16/2018 CLINICAL DATA:  78 year old female found unresponsive, intubated. EXAM: CT HEAD WITHOUT CONTRAST TECHNIQUE: Contiguous axial  images were obtained from the base of the skull through the vertex without intravenous contrast. COMPARISON:  None. FINDINGS: Brain: Cerebral volume is within normal limits for age. No midline shift, mass effect, or evidence of intracranial mass lesion. No ventriculomegaly. No acute intracranial hemorrhage identified. There are small age indeterminate infarcts in the posterior left cerebellum (series 5, image 29), anterior right middle frontal gyrus (coronal image 20), and thalami (greater on the left series 3, image 16). No associated mass effect. Other patchy bilateral cerebral white matter hypodensity. Vascular: Mild Calcified atherosclerosis at the skull base. No suspicious intracranial vascular hyperdensity. Skull: No acute osseous abnormality identified. Sinuses/Orbits: Bilateral mucosal thickening and opacification with some sinus fluid levels. The tympanic cavities and mastoids remain well pneumatized. Other: Fluid in the visible pharynx and nasal cavity in the setting of intubation. No acute orbit or scalp soft tissue findings. IMPRESSION: 1. No prior study for comparison. 2. Age indeterminate small infarcts in the left cerebellum, the anterior right frontal lobe, and thalami. 3. No associated mass effect.  No acute intracranial hemorrhage. Electronically Signed   By: Odessa FlemingH  Hall M.D.   On: 10/16/2018 16:59   Dg Chest Port 1 View  Result Date: 10/18/2018 CLINICAL DATA:  Acute respiratory failure EXAM: PORTABLE CHEST 1 VIEW COMPARISON:  Chest radiograph from one day prior. FINDINGS: Interval extubation with removal of enteric tube. Stable configuration of single lead left subclavian pacemaker. Stable cardiomediastinal silhouette with mild cardiomegaly. No pneumothorax. Stable small left pleural effusion. No significant right pleural effusion. Mild-to-moderate pulmonary edema not appreciably changed. Mild bibasilar atelectasis, decreased. IMPRESSION: 1. Stable mild-to-moderate congestive heart failure. 2.  Stable small left pleural effusion. 3. Mild bibasilar atelectasis, decreased. Electronically Signed   By: Delbert PhenixJason A Poff M.D.   On: 10/18/2018 07:46   Dg Chest Port 1 View  Result Date: 10/17/2018 CLINICAL DATA:  Acute respiratory failure EXAM: PORTABLE CHEST 1 VIEW COMPARISON:  10/16/2018 FINDINGS: Endotracheal tube terminates 3 cm above the carina. Mild patchy bilateral lower lobe opacities, suspicious for pneumonia. Possible small left pleural effusion. No pneumothorax. Cardiomegaly.  Left subclavian pacemaker. IMPRESSION: Endotracheal tube terminates 3 cm above the carina. Mild patchy bilateral lower lobe opacities, suspicious for pneumonia. Possible small left pleural effusion. Electronically Signed   By: Charline BillsSriyesh  Krishnan M.D.   On: 10/17/2018 03:41   Dg Chest Portable 1 View  Result Date: 10/16/2018 CLINICAL DATA:  Intubation, respiratory arrest EXAM: PORTABLE CHEST 1 VIEW COMPARISON:  October 09, 2018 FINDINGS: Endotracheal tube is identified distal tip 5 mm from carina. Retraction by 2 cm is recommended. A nasogastric tube is identified with distal tip not included on film but is at least in the stomach. Cardiac pacemaker is identified unchanged compared prior exam. The heart size is enlarged. Patchy consolidation of left lung base is identified. There is pulmonary edema. There is no pneumothorax. IMPRESSION: Endotracheal tube is identified with distal tip 5 mm from carina. Retraction by 2 cm is recommended. There is no pneumothorax. Congestive heart failure. Patchy consolidation of left lung base suspicious for pneumonia. These results will be called to the ordering clinician or representative by the Radiologist Assistant, and communication documented in the PACS or zVision Dashboard. Electronically Signed   By:  Sherian Rein M.D.   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   1.  Acute on chronic hypoxic respiratory failure in the setting of pneumonia, COPD exacerbation Patient extubation yesterday Patient needs incentive spirometer  2. COPD exacerbation: Continue Steroids, nebs  Elevated WBC froms teroids  Follow CBC in am   3.  Sepsis due to HCAP: Patient presented with hypotension and hypothermia  Sepsis resolved continue Cefepime MRSA PCR negative  4.  Chronic atrial flutter: Continue Eliquis  5.  Elevated troponin due to demand ischemia.  Patient has been ruled out for ACS.  Patient evaluated by cardiology team.   6.  History of sick sinus syndrome status post recent pacemaker placement  7.  Hypokalemia: Replete PRN  8.  Acute metabolic encephalopathy: This is resolved.  CT head showed small strokes of undetermined age.  Patient has no focal neurological deficits.    Management plans discussed with the patient   CODE STATUS: full  TOTAL TIME TAKING CARE OF THIS PATIENT: 29 minutes.     POSSIBLE D/C 2 days, DEPENDING ON CLINICAL CONDITION.   Frona Yost M.D on 10/18/2018 at 10:32 AM  Between 7am to 6pm - Pager - [phone] After 6pm go to [email or web address] - password [email or web address]  [facility]  Office  [phone]  CC: Primary care physician; Clinic, [facility]  Note: This dictation was prepared with Dragon dictation along with smaller phrase technology. Any transcriptional errors that result from this process are unintentional.

## 2018-10-18 NOTE — Progress Notes (Addendum)
CRITICAL CARE NOTE  CC  follow up respiratory failure  SUBJECTIVE Feels sob. No CP. No fever or chills overnight. No n/v abd pain. No headches/seizures + cough without sig sputum    SIGNIFICANT EVENTS none    BP (!) 168/71   Pulse 98   Temp 98.2 F (36.8 C) (Oral)   Resp (!) 33   Wt 109.8 kg   SpO2 92%   BMI 47.28 kg/m    REVIEW OF SYSTEMS  See subjective PHYSICAL EXAMINATION:  GENERAL:critically ill appearing, + techypnic HEAD: Normocephalic, atraumatic.  EYES: Pupils equal, round, reactive to light.  No scleral icterus.  MOUTH: Moist mucosal membrane. NECK: Supple. No thyromegaly. No nodules. No JVD.  PULMONARY: +rhonchi, +wheezing prolonged expiration CARDIOVASCULAR: S1 and S2. Regular rate and rhythm. No murmurs, rubs, or gallops.  GASTROINTESTINAL: Soft, nontender, -distended. No masses. Positive bowel sounds. No hepatosplenomegaly.  MUSCULOSKELETAL: No swelling, clubbing, or edema.  NEUROLOGIC:awake and alert non focal, appropiate  SKIN:edema with chronic skin changes/mild erythema  INTAKE/OUTPUT  Intake/Output Summary (Last 24 hours) at 10/18/2018 0831 Last data filed at 10/18/2018 0500 Gross per 24 hour  Intake 490.56 ml  Output 1725 ml  Net -1234.44 ml    LABS  CBC Recent Labs  Lab 10/16/18 1200 10/17/18 0526 10/18/18 0355  WBC 23.7* 16.7* 23.8*  HGB 13.2 11.0* 12.4  HCT 50.1* 38.2 44.3  PLT 275 216 220   Coag's Recent Labs  Lab 10/16/18 1200 10/17/18 0409  APTT 37* 38*  INR 1.86 1.75   BMET Recent Labs  Lab 10/16/18 1200 10/17/18 0409 10/18/18 0355  NA 144 146* 147*  K 4.5 3.2* 3.7  CL 101 103 104  CO2 38* 35* 35*  BUN 31* 35* 41*  CREATININE 1.65* 1.67* 1.25*  GLUCOSE 150* 101* 124*   Electrolytes Recent Labs  Lab 10/16/18 1200 10/17/18 0409 10/18/18 0355  CALCIUM 8.7* 8.3* 8.9   Sepsis Markers Recent Labs  Lab 10/16/18 1200 10/16/18 1856 10/16/18 1859 10/17/18 0409  LATICACIDVEN 1.1 1.3  --   --    PROCALCITON  --   --  <0.10 0.11   ABG Recent Labs  Lab 10/16/18 2237 10/17/18 0420 10/17/18 1145  PHART 7.51* 7.51* 7.45  PCO2ART 48 47 60*  PO2ART 102 140* 82*   Liver Enzymes Recent Labs  Lab 10/16/18 1200  AST 21  ALT 10  ALKPHOS 94  BILITOT 0.8  ALBUMIN 3.8   Cardiac Enzymes Recent Labs  Lab 10/16/18 1200 10/16/18 1759 10/16/18 2314  TROPONINI 0.04* 0.03* 0.04*   Glucose Recent Labs  Lab 10/17/18 1200 10/17/18 1604 10/17/18 1934 10/17/18 2327 10/18/18 0325 10/18/18 0746  GLUCAP 101* 125* 115* 120* 104* 103*     Recent Results (from the past 240 hour(s))  Blood culture (routine x 2)     Status: None (Preliminary result)   Collection Time: 10/16/18 12:24 PM  Result Value Ref Range Status   Specimen Description BLOOD LEFT AC  Final   Special Requests   Final    BOTTLES DRAWN AEROBIC AND ANAEROBIC Blood Culture adequate volume   Culture   Final    NO GROWTH 2 DAYS Performed at [facility], [address] [place], [place] [address]    Report Status PENDING  Incomplete  Blood culture (routine x 2)     Status: None (Preliminary result)   Collection Time: 10/16/18 12:25 PM  Result Value Ref Range Status   Specimen Description BLOOD LEFT ARM  Final   Special  Requests   Final    BOTTLES DRAWN AEROBIC AND ANAEROBIC Blood Culture adequate volume   Culture   Final    NO GROWTH 2 DAYS Performed at [facility], [address] [place], [place] [address]    Report Status PENDING  Incomplete  Culture, respiratory (non-expectorated)     Status: None (Preliminary result)   Collection Time: 10/16/18  5:07 PM  Result Value Ref Range Status   Specimen Description   Final    TRACHEAL ASPIRATE Performed at [facility], [address] [place], [place] [address]    Special Requests   Final    NONE Performed at [facility], [address] [place], [place] [address]    Gram Stain   Final    ABUNDANT WBC  PRESENT,BOTH PMN AND MONONUCLEAR NO ORGANISMS SEEN    Culture   Final    NO GROWTH < 24 HOURS Performed at [facility], [address] [place], [place] [address]    Report Status PENDING  Incomplete    MEDICATIONS   Current Facility-Administered Medications:  .  apixaban (ELIQUIS) tablet 5 mg, 5 mg, Oral, BID, Arbie CookeyBashir, Ambri Miltner, MD, 5 mg at 10/17/18 2118 .  atorvastatin (LIPITOR) tablet 40 mg, 40 mg, Oral, q1800, Arbie CookeyBashir, Eren Puebla, MD .  budesonide (PULMICORT) nebulizer solution 0.5 mg, 0.5 mg, Nebulization, BID, Eugenie NorrieBlakeney, Dana G, NP, 0.5 mg at 10/18/18 0728 .  ceFEPIme (MAXIPIME) 2 g in sodium chloride 0.9 % 100 mL IVPB, 2 g, Intravenous, Q12H, Little Ishikawaesbitt, Christopher A, RPH, Stopped at 10/18/18 0043 .  dexmedetomidine (PRECEDEX) 400 MCG/100ML (4 mcg/mL) infusion, 0.4-1.2 mcg/kg/hr, Intravenous, Titrated, Arbie CookeyBashir, Gail Vendetti, MD, Stopped at 10/17/18 1505 .  docusate sodium (COLACE) capsule 100 mg, 100 mg, Oral, BID, Arbie CookeyBashir, Delbra Zellars, MD .  famotidine (PEPCID) IVPB 20 mg premix, 20 mg, Intravenous, Q24H, Blakeney, Neldon Newportana G, NP, Stopped at 10/17/18 1724 .  hydrALAZINE (APRESOLINE) injection 10-20 mg, 10-20 mg, Intravenous, Q4H PRN, Eugenie NorrieBlakeney, Dana G, NP, 20 mg at 10/18/18 0410 .  insulin aspart (novoLOG) injection 0-9 Units, 0-9 Units, Subcutaneous, Q4H, Eugenie NorrieBlakeney, Dana G, NP, 1 Units at 10/17/18 1700 .  ipratropium-albuterol (DUONEB) 0.5-2.5 (3) MG/3ML nebulizer solution 3 mL, 3 mL, Nebulization, Q6H, Blakeney, Dana G, NP, 3 mL at 10/18/18 0728 .  ipratropium-albuterol (DUONEB) 0.5-2.5 (3) MG/3ML nebulizer solution 3 mL, 3 mL, Nebulization, Q6H PRN, Eugenie NorrieBlakeney, Dana G, NP .  levothyroxine (SYNTHROID, LEVOTHROID) tablet 100 mcg, 100 mcg, Oral, Q0600, Arbie CookeyBashir, Jontae Sonier, MD, 100 mcg at 10/18/18 0528 .  MEDLINE mouth rinse, 15 mL, Mouth Rinse, BID, Arbie CookeyBashir, Jyra Lagares, MD, 15 mL at 10/17/18 2108 .  methylPREDNISolone sodium succinate (SOLU-MEDROL) 40 mg/mL injection 40 mg, 40 mg, Intravenous, Q12H, Blakeney, Neldon Newportana  G, NP, 40 mg at 10/17/18 2335 .  ondansetron (ZOFRAN) tablet 4 mg, 4 mg, Oral, Q6H PRN **OR** ondansetron (ZOFRAN) injection 4 mg, 4 mg, Intravenous, Q6H PRN, Gouru, Aruna, MD   cxr as per rad 10/18/18 IMPRESSION: 1. Stable mild-to-moderate congestive heart failure. 2. Stable small left pleural effusion. 3. Mild bibasilar atelectasis, decreased.       ASSESSMENT AND PLAN SYNOPSIS  Acuteon chronichypoxic respiratory failure secondary to pneumonia, AECOPD, and vascular congestion  Hx: Morbid Obesity  S/p extubation . Did ok, slightly more sob todau Increased Scheduled and prn bronchodilator therapy   contIV and nebulized steroids  Aggressive pulm toilet   Acute on chronic combined systolic and diastolic CHF EF 50 to 55%, mod TR Elevated troponin likely secondary to demand ischemia  in setting of respiratory failure  HTN Hx: Atrial Flutter and Pacemaker Continuous telemetry monitoring Trend troponin's Prn hydralazine for bp management Consider lasix if BNP high . 20 mg today apixiban  Trend CBC Monitor for s/sx of bleeding and transfuse for hgb <7  Acute on chronic renal failure , improving renal function Trend BMP  Replace electrolytes as indicated  Monitor UOP Avoid nephrotoxic medications   Pneumonia  Trend WBC and monitor fever curve  WBC decreasing, fever persists Trend PCT  Follow cultures  Continue cefepime ,stopped  vancomycin  MRSA PCR negative   Hyperglycemia  CBG's q4hrs  SSI   Acute encephalopathy of unknown etiology , resolved  CT head showed small strokes of undetermined age ? Old.    time spent 35 minutes  Overall, patient is critically ill, prognosis is guarded.  Patient with Multiorgan failure and at high risk for cardiac arrest and death.   She had good response to lasix, will give her another 20mg  of lasix . Cont bipap prn.pt wants to be re intubated if needed. Start D5% for hypernatremia at 6ml/hr D/w family,they are in  agreement. Full code

## 2018-10-18 NOTE — Progress Notes (Signed)
[facility]  Patient: Danielle MoatsMarian L Munoz / Admit Date: 10/16/2018 / Date of Encounter: 10/18/2018, 6:27 AM   Subjective: Patient still significantly weak and fatigue and still short of breath but significantly improved from admission.  No evidence of congestive heart failure or myocardial infarction at this time.  No current chest pain or anginal symptoms this morning.  Patient continues having sinus tachycardia with no current evidence of other rhythm disturbances  Review of Systems: Positive for: Breath cough and congestion Negative for: Vision change, hearing change, syncope, dizziness, nausea, vomiting,diarrhea, bloody stool, stomach pain, as it of for cough, congestion, negative for diaphoresis, urinary frequency, urinary pain,skin lesions, skin rashes Others previously listed  Objective: Telemetry: Sinus tachycardia Physical Exam: Blood pressure (!) 168/71, pulse 98, temperature 98.2 F (36.8 C), temperature source Oral, resp. rate (!) 33, weight 109.8 kg, SpO2 92 %. Body mass index is 47.28 kg/m. General: Well developed, well nourished, in no acute distress. Head: Normocephalic, atraumatic, sclera non-icteric, no xanthomas, nares are without discharge. Neck: No apparent masses Lungs: Normal respirations with some wheezes, diffuse rhonchi, no rales , few crackles   Heart: Regular rate and rhythm, normal S1 S2, no murmur, no rub, no gallop, PMI is normal size and placement, carotid upstroke normal without bruit, jugular venous pressure normal Abdomen: Soft, non-tender, non-distended with normoactive bowel sounds. No hepatosplenomegaly. Abdominal aorta is normal size without bruit Extremities: Trace edema, no clubbing, no cyanosis, no ulcers,  Peripheral: 2+ radial, 2+ femoral, 2+ dorsal pedal pulses Neuro: Alert and oriented. Moves all extremities spontaneously. Psych:  Responds to questions appropriately with a normal affect.   Intake/Output  Summary (Last 24 hours) at 10/18/2018 [id number] Last data filed at 10/18/2018 0500 Gross per 24 hour  Intake 490.56 ml  Output 1725 ml  Net -1234.44 ml    Inpatient Medications:  . apixaban  5 mg Oral BID  . atorvastatin  40 mg Oral q1800  . budesonide (PULMICORT) nebulizer solution  0.5 mg Nebulization BID  . docusate sodium  100 mg Oral BID  . insulin aspart  0-9 Units Subcutaneous Q4H  . ipratropium-albuterol  3 mL Nebulization Q6H  . levothyroxine  100 mcg Oral Q0600  . mouth rinse  15 mL Mouth Rinse BID  . methylPREDNISolone (SOLU-MEDROL) injection  40 mg Intravenous Q12H   Infusions:  . ceFEPime (MAXIPIME) IV Stopped (10/18/18 0043)  . dexmedetomidine (PRECEDEX) IV infusion Stopped (10/17/18 1505)  . famotidine (PEPCID) IV Stopped (10/17/18 1724)    Labs: Recent Labs    10/17/18 0409 10/18/18 0355  NA 146* 147*  K 3.2* 3.7  CL 103 104  CO2 35* 35*  GLUCOSE 101* 124*  BUN 35* 41*  CREATININE 1.67* 1.25*  CALCIUM 8.3* 8.9   Recent Labs    10/16/18 1200  AST 21  ALT 10  ALKPHOS 94  BILITOT 0.8  PROT 7.4  ALBUMIN 3.8   Recent Labs    10/16/18 1200 10/17/18 0526 10/18/18 0355  WBC 23.7* 16.7* 23.8*  NEUTROABS 21.8*  --  22.7*  HGB 13.2 11.0* 12.4  HCT 50.1* 38.2 44.3  MCV 82.4 75.0* 78.7*  PLT 275 216 220   Recent Labs    10/16/18 1200 10/16/18 1759 10/16/18 2314  TROPONINI 0.04* 0.03* 0.04*   Invalid input(s): POCBNP No results for input(s): HGBA1C in the last 72 hours.   Weights: Filed Weights   10/16/18 1317  Weight: 109.8 kg     Radiology/Studies:  Dg Chest 2 View  Result Date: 10/09/2018 CLINICAL DATA:  Dyspnea EXAM: CHEST - 2 VIEW COMPARISON:  10/06/2018 FINDINGS: Interval decrease in left-sided pleural effusion. Blunting of the posterior costophrenic angles from small bilateral pleural effusions are now identified. Cardiomegaly with interstitial edema and pulmonary vascular redistribution is redemonstrated without significant interval  change. Nonaneurysmal atherosclerosis of the aortic arch. Thoracic spondylosis is noted. No acute osseous abnormality is seen. IMPRESSION: Cardiomegaly with interstitial edema and small bilateral pleural effusions. Interval decrease in left-sided pleural effusion. Electronically Signed   By: Tollie Ethavid  Kwon M.D.   On: 10/09/2018 01:28   Dg Abd 1 View  Result Date: 10/16/2018 CLINICAL DATA:  OG tube placement. EXAM: ABDOMEN - 1 VIEW COMPARISON:  CT 08/19/2011. FINDINGS: OG tube noted with tip and side hole over the stomach. Pacing wire noted the right ventricle. Surgical catheter noted over the right abdomen. Questionable distended loop of colon with possible thickening of the colonic wall noted. Abdominal series can be obtained for further evaluation. Left nephrolithiasis again noted. IMPRESSION: 1.  NG tube noted with tip and side hole of the stomach. 2. Questionable slightly distended loop of colon with possible thickening of the colonic wall noted. Abdominal series can be obtained for further evaluation. 3.  Left nephrolithiasis again noted. Electronically Signed   By: Maisie Fushomas  Register   On: 10/16/2018 16:30   Ct Head Wo Contrast  Result Date: 10/16/2018 CLINICAL DATA:  78 year old female found unresponsive, intubated. EXAM: CT HEAD WITHOUT CONTRAST TECHNIQUE: Contiguous axial images were obtained from the base of the skull through the vertex without intravenous contrast. COMPARISON:  None. FINDINGS: Brain: Cerebral volume is within normal limits for age. No midline shift, mass effect, or evidence of intracranial mass lesion. No ventriculomegaly. No acute intracranial hemorrhage identified. There are small age indeterminate infarcts in the posterior left cerebellum (series 5, image 29), anterior right middle frontal gyrus (coronal image 20), and thalami (greater on the left series 3, image 16). No associated mass effect. Other patchy bilateral cerebral white matter hypodensity. Vascular: Mild Calcified  atherosclerosis at the skull base. No suspicious intracranial vascular hyperdensity. Skull: No acute osseous abnormality identified. Sinuses/Orbits: Bilateral mucosal thickening and opacification with some sinus fluid levels. The tympanic cavities and mastoids remain well pneumatized. Other: Fluid in the visible pharynx and nasal cavity in the setting of intubation. No acute orbit or scalp soft tissue findings. IMPRESSION: 1. No prior study for comparison. 2. Age indeterminate small infarcts in the left cerebellum, the anterior right frontal lobe, and thalami. 3. No associated mass effect.  No acute intracranial hemorrhage. Electronically Signed   By: Odessa FlemingH  Hall M.D.   On: 10/16/2018 16:59   [place] Chest (pleural Effusion)  Result Date: 10/07/2018 CLINICAL DATA:  Acute on chronic respiratory failure. Evaluate for pleural fluid and thoracentesis. EXAM: CHEST ULTRASOUND COMPARISON:  Chest radiograph 10/06/2018 FINDINGS: Both sides of the chest were evaluated with ultrasound. A small amount of subpulmonic pleural fluid seen on both sides. Fluid was not amendable for thoracentesis due to overlying lung. IMPRESSION: Small amount of pleural fluid bilaterally. Fluid is not amendable for thoracentesis. Electronically Signed   By: Richarda OverlieAdam  Henn M.D.   On: 10/07/2018 10:58   Dg Chest Port 1 View  Result Date: 10/17/2018 CLINICAL DATA:  Acute respiratory failure EXAM: PORTABLE CHEST 1 VIEW COMPARISON:  10/16/2018 FINDINGS: Endotracheal tube terminates 3 cm above the carina. Mild patchy bilateral lower lobe opacities, suspicious for pneumonia. Possible small left pleural effusion. No pneumothorax. Cardiomegaly.  Left subclavian pacemaker. IMPRESSION: Endotracheal tube  terminates 3 cm above the carina. Mild patchy bilateral lower lobe opacities, suspicious for pneumonia. Possible small left pleural effusion. Electronically Signed   By: Charline Bills M.D.   On: 10/17/2018 03:41   Dg Chest Portable 1 View  Result Date:  10/16/2018 CLINICAL DATA:  Intubation, respiratory arrest EXAM: PORTABLE CHEST 1 VIEW COMPARISON:  October 09, 2018 FINDINGS: Endotracheal tube is identified distal tip 5 mm from carina. Retraction by 2 cm is recommended. A nasogastric tube is identified with distal tip not included on film but is at least in the stomach. Cardiac pacemaker is identified unchanged compared prior exam. The heart size is enlarged. Patchy consolidation of left lung base is identified. There is pulmonary edema. There is no pneumothorax. IMPRESSION: Endotracheal tube is identified with distal tip 5 mm from carina. Retraction by 2 cm is recommended. There is no pneumothorax. Congestive heart failure. Patchy consolidation of left lung base suspicious for pneumonia. These results will be called to the ordering clinician or representative by the Radiologist Assistant, and communication documented in the PACS or zVision Dashboard. Electronically Signed   By: Sherian Rein M.D.   On: 10/16/2018 13:23   Dg Chest Port 1 View  Result Date: 10/09/2018 CLINICAL DATA:  Post pacemaker insertion EXAM: PORTABLE CHEST 1 VIEW COMPARISON:  Chest x-ray of 10/08/2018 FINDINGS: A single lead permanent pacemaker is now present. No complicating features are seen. There is cardiomegaly present with opacity at the left lung base consistent with effusion, pleural thickening, but pneumonia can not be excluded. No evidence of edema is noted. The right lung is clear. No pneumothorax is seen. IMPRESSION: 1. New single lead permanent pacemaker is now present. No complicating features. 2. Stable cardiomegaly.  No edema is seen. 3. No change in opacity at the left lung base. Electronically Signed   By: Dwyane Dee M.D.   On: 10/09/2018 15:16   Dg Chest Portable 1 View  Result Date: 10/06/2018 CLINICAL DATA:  Shortness of breath and bradycardia. EXAM: PORTABLE CHEST 1 VIEW COMPARISON:  09/12/2018 FINDINGS: There is a large left pleural effusion which appears  increased in volume compared with the previous exam. No significant right effusion identified. Mild diffuse edema identified. IMPRESSION: 1. Significant increase in volume of left pleural effusion. 2. Pulmonary edema. Electronically Signed   By: Signa Kell M.D.   On: 10/06/2018 14:48   Dg C-arm 1-60 Min-no Report  Result Date: 10/09/2018 Fluoroscopy was utilized by the requesting physician.  No radiographic interpretation.     Assessment and Recommendation  78 y.o. female with known essential hypertension mixed hyperlipidemia and sick sinus syndrome status post pacemaker placement on appropriate medication management currently remaining in normal sinus rhythm without evidence of congestive heart failure or myocardial infarction status post respiratory failure slightly improved 1.  Continue supportive care for respiratory failure sepsis and hypoxia with current treatment 2.  No further cardiac intervention due to no evidence of congestive heart failure or myocardial infarction 3.  No further cardiac diagnostics necessary today 4.  Continue risk factor management of cardiovascular disease in the future occluding antihypertensives and high intensity cholesterol therapy 5.  Continue Eliquis for further risk reduction of sick sinus syndrome and stroke risk 7.  Call if further questions but follow-up IN 1 to 2 weeks after discharge for further adjustments of medication management  Signed, Arnoldo Hooker M.D. FACC

## 2018-10-18 NOTE — Consult Note (Signed)
Pharmacy Antibiotic Note  Danielle Munoz is a 78 y.o. female admitted on 10/16/2018 with pneumonia.  Pharmacy has been consulted for cefepime and vancomcyin dosing.  Vancomycin d/c 10/17/2018  Plan: Day 3 -Cefepime 2 gm IV every 12 hours for Crcl 43 ml/min    Weight: 242 lb 1 oz (109.8 kg)  Temp (24hrs), Avg:98 F (36.7 C), Min:97 F (36.1 C), Max:98.5 F (36.9 C)  Recent Labs  Lab 10/16/18 1200 10/16/18 1856 10/17/18 0409 10/17/18 0526 10/18/18 0355  WBC 23.7*  --   --  16.7* 23.8*  CREATININE 1.65*  --  1.67*  --  1.25*  LATICACIDVEN 1.1 1.3  --   --   --     Estimated Creatinine Clearance: 43 mL/min (A) (by C-G formula based on SCr of 1.25 mg/dL (H)).    No Known Allergies  Antimicrobials this admission: Cefepime 1/16 >>  Vancomycin 1/16 >> 1/17  Dose adjustments this admission:   Microbiology results: 1/16 BCx: pending  1/16 MRSA PCR: pending collection  Thank you for allowing pharmacy to be a part of this patient's care.  Angelique Blonder, PharmD Clinical Pharmacist 10/18/2018 9:22 AM

## 2018-10-19 ENCOUNTER — Other Ambulatory Visit: Payer: Self-pay

## 2018-10-19 ENCOUNTER — Inpatient Hospital Stay: Payer: Medicare Other

## 2018-10-19 LAB — CBC WITH DIFFERENTIAL/PLATELET
Abs Immature Granulocytes: 0.31 10*3/uL — ABNORMAL HIGH (ref 0.00–0.07)
BASOS ABS: 0 10*3/uL (ref 0.0–0.1)
Basophils Relative: 0 %
EOS PCT: 0 %
Eosinophils Absolute: 0 10*3/uL (ref 0.0–0.5)
HCT: 43.4 % (ref 36.0–46.0)
HEMOGLOBIN: 12.2 g/dL (ref 12.0–15.0)
Immature Granulocytes: 2 %
Lymphocytes Relative: 2 %
Lymphs Abs: 0.4 10*3/uL — ABNORMAL LOW (ref 0.7–4.0)
MCH: 21.5 pg — ABNORMAL LOW (ref 26.0–34.0)
MCHC: 28.1 g/dL — ABNORMAL LOW (ref 30.0–36.0)
MCV: 76.4 fL — ABNORMAL LOW (ref 80.0–100.0)
Monocytes Absolute: 0.5 10*3/uL (ref 0.1–1.0)
Monocytes Relative: 3 %
Neutro Abs: 16.1 10*3/uL — ABNORMAL HIGH (ref 1.7–7.7)
Neutrophils Relative %: 93 %
Platelets: 213 10*3/uL (ref 150–400)
RBC: 5.68 MIL/uL — ABNORMAL HIGH (ref 3.87–5.11)
RDW: 24.9 % — ABNORMAL HIGH (ref 11.5–15.5)
Smear Review: NORMAL
WBC: 17.2 10*3/uL — ABNORMAL HIGH (ref 4.0–10.5)
nRBC: 0 % (ref 0.0–0.2)

## 2018-10-19 LAB — BLOOD GAS, ARTERIAL
ACID-BASE EXCESS: 12 mmol/L — AB (ref 0.0–2.0)
Bicarbonate: 39.3 mmol/L — ABNORMAL HIGH (ref 20.0–28.0)
FIO2: 0.32
O2 Saturation: 96.6 %
Patient temperature: 37
pCO2 arterial: 62 mmHg — ABNORMAL HIGH (ref 32.0–48.0)
pH, Arterial: 7.41 (ref 7.350–7.450)
pO2, Arterial: 86 mmHg (ref 83.0–108.0)

## 2018-10-19 LAB — CULTURE, RESPIRATORY: CULTURE: NO GROWTH

## 2018-10-19 LAB — CULTURE, RESPIRATORY W GRAM STAIN

## 2018-10-19 LAB — BASIC METABOLIC PANEL
Anion gap: 7 (ref 5–15)
BUN: 49 mg/dL — ABNORMAL HIGH (ref 8–23)
CO2: 36 mmol/L — ABNORMAL HIGH (ref 22–32)
Calcium: 8.7 mg/dL — ABNORMAL LOW (ref 8.9–10.3)
Chloride: 103 mmol/L (ref 98–111)
Creatinine, Ser: 1.18 mg/dL — ABNORMAL HIGH (ref 0.44–1.00)
GFR calc Af Amer: 52 mL/min — ABNORMAL LOW (ref >60)
GFR calc non Af Amer: 45 mL/min — ABNORMAL LOW (ref >60)
Glucose, Bld: 118 mg/dL — ABNORMAL HIGH (ref 70–99)
Potassium: 3.5 mmol/L (ref 3.5–5.1)
Sodium: 146 mmol/L — ABNORMAL HIGH (ref 135–145)

## 2018-10-19 LAB — BRAIN NATRIURETIC PEPTIDE: B Natriuretic Peptide: 1313 pg/mL — ABNORMAL HIGH (ref 0.0–100.0)

## 2018-10-19 LAB — GLUCOSE, CAPILLARY
GLUCOSE-CAPILLARY: 107 mg/dL — AB (ref 70–99)
Glucose-Capillary: 102 mg/dL — ABNORMAL HIGH (ref 70–99)
Glucose-Capillary: 113 mg/dL — ABNORMAL HIGH (ref 70–99)
Glucose-Capillary: 127 mg/dL — ABNORMAL HIGH (ref 70–99)
Glucose-Capillary: 171 mg/dL — ABNORMAL HIGH (ref 70–99)
Glucose-Capillary: 97 mg/dL (ref 70–99)

## 2018-10-19 MED ORDER — IPRATROPIUM-ALBUTEROL 0.5-2.5 (3) MG/3ML IN SOLN
3.0000 mL | Freq: Four times a day (QID) | RESPIRATORY_TRACT | Status: DC
  Administered 2018-10-19 – 2018-10-22 (×12): 3 mL via RESPIRATORY_TRACT
  Filled 2018-10-19 (×13): qty 3

## 2018-10-19 MED ORDER — ALBUTEROL SULFATE (2.5 MG/3ML) 0.083% IN NEBU: 2.5000 mg | INHALATION_SOLUTION | RESPIRATORY_TRACT | Status: DC | PRN

## 2018-10-19 MED ORDER — FUROSEMIDE 10 MG/ML IJ SOLN
20.0000 mg | Freq: Two times a day (BID) | INTRAMUSCULAR | Status: AC
  Administered 2018-10-19 – 2018-10-20 (×4): 20 mg via INTRAVENOUS
  Filled 2018-10-19 (×4): qty 2

## 2018-10-19 MED ORDER — DEXMEDETOMIDINE HCL IN NACL 400 MCG/100ML IV SOLN
0.0000 ug/kg/h | INTRAVENOUS | Status: DC
  Administered 2018-10-19: 0.6 ug/kg/h via INTRAVENOUS
  Administered 2018-10-20: 0.8 ug/kg/h via INTRAVENOUS
  Filled 2018-10-19 (×3): qty 100

## 2018-10-19 NOTE — Clinical Social Work Note (Signed)
Clinical Social Work Assessment  Patient Details  Name: Danielle Munoz MRN: [id number] Date of Birth: 05/24/1941  Date of referral:  10/19/18               Reason for consult:  Discharge Planning, Facility Placement                Permission sought to share information with:  Case Manager, Customer service manager, Family Supports Permission granted to share information::  Yes, Verbal Permission Granted  Name::     Marin Roberts Sister   [phone], Peggye Form Daughter   [phone]   Agency::  [facility]  Relationship::  Marin Roberts Sister   [phone], Peggye Form Daughter   [phone]   Contact Information:  Marin Roberts Sister   [phone], Peggye Form Daughter   [phone]   Housing/Transportation Living arrangements for the past 2 months:  Single Family Home, Skilled Nursing Facility(Lives in a single family home but currently at a SNF. Intent to return home after SNF stay. ) Source of Information:  Patient, Adult Children Patient Interpreter Needed:  None Criminal Activity/Legal Involvement Pertinent to Current Situation/Hospitalization:  No - Comment as needed Significant Relationships:    Lives with:  Relatives, Friends, Adult Children Do you feel safe going back to the place where you live?  Yes Need for family participation in patient care:  No (Coment)  Care giving concerns: Family and patient would like for patient to return to [facility] to regain her strengths.   Social Worker assessment / plan:  Clinician met with patient and the patient's family at bedside.  Patient gave consent for clinician to perform assessment and to speak to her family. The patient is a 78 y.o., Caucasian female who came from SNF [facility].  Family plan is for the patient to return to [facility] and once she regains her strengths they would like for the patient to return home.   Plan: Return to [place].   Employment status:  Retired Forensic scientist:   Medicare PT Recommendations:  [place] / Referral to community resources:  [place] [facility](Returning to [employer])  Patient/Family's Response to care:  Family and patient agreeable to plan to return to SNF.   Patient/Family's Understanding of and Emotional Response to Diagnosis, Current Treatment, and Prognosis:  Patient and family demonstrated good understanding of the plan and diagnosis.   Emotional Assessment Appearance:  Appears older than stated age Attitude/Demeanor/Rapport:  Engaged Affect (typically observed):  Calm Orientation:  Oriented to Self, Oriented to Place, Oriented to  Time, Oriented to Situation Alcohol / Substance use:  Not Applicable Psych involvement (Current and /or in the community):     Discharge Needs  Concerns to be addressed:  Discharge Planning Concerns Readmission within the last 30 days:  Yes Current discharge risk:  None Barriers to Discharge:  Continued Medical Work up   [employer], [place] 10/19/2018, 3:07 PM

## 2018-10-19 NOTE — Progress Notes (Signed)
CRITICAL CARE NOTE  CC  follow up respiratory failure  SUBJECTIVE Denies cp. + sob with cough without much sputum. No fever or chills. No CP. No headaches. Wants to eat Tolerated bipap well overnight    SIGNIFICANT EVENTS    BP (!) 184/70   Pulse 98   Temp 98.4 F (36.9 C) (Oral)   Resp (!) 23   Wt 109.8 kg   SpO2 97%   BMI 47.28 kg/m    REVIEW OF SYSTEMS   PHYSICAL EXAMINATION:  GENERAL:critically ill appearing, no resp distress HEAD: Normocephalic, atraumatic.  EYES: Pupils equal, round, reactive to light.  No scleral icterus.  MOUTH: Moist mucosal membrane. NECK: Supple. No thyromegaly. No nodules. No JVD.  PULMONARY: +rhonchi, +wheezing CARDIOVASCULAR: S1 and S2. Regular rate and rhythm. No murmurs, rubs, or gallops.  GASTROINTESTINAL: Soft, nontender, -distended. No masses. Positive bowel sounds. No hepatosplenomegaly.  MUSCULOSKELETAL: No swelling, clubbing, +edema. With mild erythema NEUROLOGIC:awake and alert and appropiate, non focal SKIN:intact,warm,dry  INTAKE/OUTPUT  Intake/Output Summary (Last 24 hours) at 10/19/2018 0810 Last data filed at 10/19/2018 0700 Gross per 24 hour  Intake 1307.49 ml  Output 2200 ml  Net -892.51 ml    LABS  CBC Recent Labs  Lab 10/17/18 0526 10/18/18 0355 10/19/18 0509  WBC 16.7* 23.8* 17.2*  HGB 11.0* 12.4 12.2  HCT 38.2 44.3 43.4  PLT 216 220 213   Coag's Recent Labs  Lab 10/16/18 1200 10/17/18 0409  APTT 37* 38*  INR 1.86 1.75   BMET Recent Labs  Lab 10/17/18 0409 10/18/18 0355 10/19/18 0509  NA 146* 147* 146*  K 3.2* 3.7 3.5  CL 103 104 103  CO2 35* 35* 36*  BUN 35* 41* 49*  CREATININE 1.67* 1.25* 1.18*  GLUCOSE 101* 124* 118*   Electrolytes Recent Labs  Lab 10/17/18 0409 10/18/18 0355 10/19/18 0509  CALCIUM 8.3* 8.9 8.7*   Sepsis Markers Recent Labs  Lab 10/16/18 1200 10/16/18 1856 10/16/18 1859 10/17/18 0409  LATICACIDVEN 1.1 1.3  --   --   PROCALCITON  --   --  <0.10  0.11   ABG Recent Labs  Lab 10/16/18 2237 10/17/18 0420 10/17/18 1145  PHART 7.51* 7.51* 7.45  PCO2ART 48 47 60*  PO2ART 102 140* 82*   Liver Enzymes Recent Labs  Lab 10/16/18 1200  AST 21  ALT 10  ALKPHOS 94  BILITOT 0.8  ALBUMIN 3.8   Cardiac Enzymes Recent Labs  Lab 10/16/18 1200 10/16/18 1759 10/16/18 2314  TROPONINI 0.04* 0.03* 0.04*   Glucose Recent Labs  Lab 10/18/18 1210 10/18/18 1554 10/18/18 1936 10/18/18 2354 10/19/18 0347 10/19/18 0746  GLUCAP 97 101* 109* 91 107* 102*     Recent Results (from the past 240 hour(s))  Blood culture (routine x 2)     Status: None (Preliminary result)   Collection Time: 10/16/18 12:24 PM  Result Value Ref Range Status   Specimen Description BLOOD LEFT AC  Final   Special Requests   Final    BOTTLES DRAWN AEROBIC AND ANAEROBIC Blood Culture adequate volume   Culture   Final    NO GROWTH 3 DAYS Performed at [facility], [address] [place], [place] [address]    Report Status PENDING  Incomplete  Blood culture (routine x 2)     Status: None (Preliminary result)   Collection Time: 10/16/18 12:25 PM  Result Value Ref Range Status   Specimen Description BLOOD LEFT ARM  Final   Special Requests  Final    BOTTLES DRAWN AEROBIC AND ANAEROBIC Blood Culture adequate volume   Culture   Final    NO GROWTH 3 DAYS Performed at [facility], [address] [place], [place] [address]    Report Status PENDING  Incomplete  Culture, respiratory (non-expectorated)     Status: None (Preliminary result)   Collection Time: 10/16/18  5:07 PM  Result Value Ref Range Status   Specimen Description   Final    TRACHEAL ASPIRATE Performed at [facility], [address], [place], [place] [address]    Special Requests   Final    NONE Performed at [facility], [address] [place], [place] [address]    Gram Stain   Final    ABUNDANT WBC PRESENT,BOTH PMN AND MONONUCLEAR NO  ORGANISMS SEEN    Culture   Final    NO GROWTH 2 DAYS Performed at [facility], [address] [place], [place] [address]    Report Status PENDING  Incomplete    MEDICATIONS   Current Facility-Administered Medications:  .  apixaban (ELIQUIS) tablet 5 mg, 5 mg, Oral, BID, Arbie Cookey, MD, 5 mg at 10/18/18 2141 .  atorvastatin (LIPITOR) tablet 40 mg, 40 mg, Oral, q1800, Arbie Cookey, MD, 40 mg at 10/18/18 1700 .  budesonide (PULMICORT) nebulizer solution 0.5 mg, 0.5 mg, Nebulization, BID, Eugenie Norrie, NP, 0.5 mg at 10/19/18 0746 .  ceFEPIme (MAXIPIME) 2 g in sodium chloride 0.9 % 100 mL IVPB, 2 g, Intravenous, Q12H, Little Ishikawa, RPH, Stopped at 10/19/18 0047 .  dexmedetomidine (PRECEDEX) 400 MCG/100ML (4 mcg/mL) infusion, 0.4-1.2 mcg/kg/hr, Intravenous, Titrated, Arbie Cookey, MD, Stopped at 10/17/18 1505 .  dextrose 5 % solution, , Intravenous, Continuous, Arbie Cookey, MD, Last Rate: 40 mL/hr at 10/19/18 0700 .  docusate sodium (COLACE) capsule 100 mg, 100 mg, Oral, BID, Arbie Cookey, MD, 100 mg at 10/18/18 2140 .  famotidine (PEPCID) IVPB 20 mg premix, 20 mg, Intravenous, Q24H, Blakeney, Dana G, NP, Last Rate: 100 mL/hr at 10/18/18 1639, 20 mg at 10/18/18 1639 .  hydrALAZINE (APRESOLINE) injection 10-20 mg, 10-20 mg, Intravenous, Q4H PRN, Eugenie Norrie, NP, 20 mg at 10/18/18 2338 .  insulin aspart (novoLOG) injection 0-9 Units, 0-9 Units, Subcutaneous, Q4H, Eugenie Norrie, NP, 1 Units at 10/17/18 1700 .  ipratropium-albuterol (DUONEB) 0.5-2.5 (3) MG/3ML nebulizer solution 3 mL, 3 mL, Nebulization, Q6H PRN, Blakeney, Dana G, NP .  ipratropium-albuterol (DUONEB) 0.5-2.5 (3) MG/3ML nebulizer solution 3 mL, 3 mL, Nebulization, Q4H, Arbie Cookey, MD, 3 mL at 10/19/18 0746 .  levothyroxine (SYNTHROID, LEVOTHROID) tablet 100 mcg, 100 mcg, Oral, Q0600, Arbie Cookey, MD, 100 mcg at 10/19/18 0525 .  MEDLINE mouth rinse, 15 mL, Mouth Rinse, BID, Arbie Cookey, MD, 15 mL at 10/18/18 2142 .  methylPREDNISolone sodium succinate (SOLU-MEDROL) 40 mg/mL injection 40 mg, 40 mg, Intravenous, Q12H, Blakeney, Neldon Newport, NP, 40 mg at 10/18/18 2336 .  ondansetron (ZOFRAN) tablet 4 mg, 4 mg, Oral, Q6H PRN **OR** ondansetron (ZOFRAN) injection 4 mg, 4 mg, Intravenous, Q6H PRN, Gouru, Aruna, MD      Indwelling Urinary Catheter continued, requirement due to   Reason to continue Indwelling Urinary Catheter for strict Intake/Output monitoring for hemodynamic instability      ASSESSMENT AND PLAN SYNOPSIS Acuteon chronichypoxic respiratory failure secondary to pneumonia, AECOPD, and vascular congestion  Hx: Morbid Obesity S/p extubation . 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%, mod TR Elevated troponin likely secondary to demand ischemia in setting of respiratory failure  HTN Hx: Atrial Flutter and Pacemaker Continuous telemetry monitoring Trend troponin's Prn hydralazine for bp management Lasix 20 mg q12 apixiban  Trend CBC Monitor for s/sx of bleeding and transfuse for hgb <7  Acute on chronic renal failure , improving renal function Trend BMP  Replace electrolytes as indicated  Monitor UOP Avoid nephrotoxic medications   Pneumonia  Trend WBC and monitor fever curve WBC decreasing, fever persists Trend PCT  Follow cultures  Continue cefepime,stoppedvancomycin MRSA PCR negative   Hyperglycemia  CBG's q4hrs  SSI   Hypernatremia cont D5 at 76ml/hr  Acute encephalopathy of unknown etiology , resolved  CT head showed small strokes of undetermined age ? Old.    time spent 35 minutes  Full code   Arbie Cookey, MD  10/19/2018 8:10 AM [facility]

## 2018-10-19 NOTE — [place] FL2 (Signed)
[place] MEDICAID FL2 LEVEL OF CARE SCREENING TOOL     IDENTIFICATION  Patient Name: Danielle Munoz Birthdate: 1940/10/16 Sex: female Admission Date (Current Location): 10/16/2018  [place] and [place] Number:  Chiropodist and Address:  [facility], [address], [place], [place] [address]      Provider Number: [id number]  Attending Physician Name and Address:  Enid Baas, MD  Relative Name and Phone Number:  Martie Round   [phone]     Current Level of Care: SNF Recommended Level of Care: Skilled Nursing Facility Prior Approval Number:    Date Approved/Denied:   PASRR Number: [id number]  Discharge Plan: SNF    Current Diagnoses: Patient Active Problem List   Diagnosis Date Noted  . Acute respiratory failure (HCC) 10/16/2018  . Bradycardia 10/06/2018  . Sepsis (HCC) 09/12/2018  . Acute on chronic diastolic heart failure (HCC)   . Acute on chronic systolic CHF (congestive heart failure) (HCC) 11/07/2017  . Hypothyroidism 11/07/2017  . HTN (hypertension) 11/07/2017  . COPD (chronic obstructive pulmonary disease) (HCC) 11/07/2017    Orientation RESPIRATION BLADDER Height & Weight     Self, Time, Situation, Place  O2(o2 3L)   Weight: 242 lb 1 oz (109.8 kg) Height:     BEHAVIORAL SYMPTOMS/MOOD NEUROLOGICAL BOWEL NUTRITION STATUS        Diet(DYS 2  HEART HEALTHY)  AMBULATORY STATUS COMMUNICATION OF NEEDS Skin   Limited Assist   Normal                       Personal Care Assistance Level of Assistance  Bathing, Feeding, Dressing Bathing Assistance: Limited assistance Feeding assistance: Independent Dressing Assistance: Limited assistance     Functional Limitations Info  Sight, Hearing, Speech Sight Info: Adequate Hearing Info: Adequate Speech Info: Adequate    SPECIAL CARE FACTORS FREQUENCY  PT (By licensed PT), OT (By licensed OT)     PT Frequency: 5x OT Frequency: 3x             Contractures Contractures Info: Not present    Additional Factors Info  Code Status, Allergies Code Status Info: Full Code Allergies Info: No known allergies           Current Medications (10/19/2018):  This is the current hospital active medication list Current Facility-Administered Medications  Medication Dose Route Frequency Provider Last Rate Last Dose  . apixaban (ELIQUIS) tablet 5 mg  5 mg Oral BID Arbie Cookey, MD   5 mg at 10/19/18 1024  . atorvastatin (LIPITOR) tablet 40 mg  40 mg Oral q1800 Arbie Cookey, MD   40 mg at 10/18/18 1700  . budesonide (PULMICORT) nebulizer solution 0.5 mg  0.5 mg Nebulization BID Eugenie Norrie, NP   0.5 mg at 10/19/18 0746  . ceFEPIme (MAXIPIME) 2 g in sodium chloride 0.9 % 100 mL IVPB  2 g Intravenous Q12H Little Ishikawa, RPH   Stopped at 10/19/18 1255  . dextrose 5 % solution   Intravenous Continuous Arbie Cookey, MD 40 mL/hr at 10/19/18 1300    . docusate sodium (COLACE) capsule 100 mg  100 mg Oral BID Arbie Cookey, MD   100 mg at 10/19/18 1024  . famotidine (PEPCID) IVPB 20 mg premix  20 mg Intravenous Q24H Eugenie Norrie, NP 100 mL/hr at 10/18/18 1639 20 mg at 10/18/18 1639  . furosemide (LASIX) injection 20 mg  20 mg Intravenous Q12H Arbie Cookey, MD   20 mg  at 10/19/18 1024  . hydrALAZINE (APRESOLINE) injection 10-20 mg  10-20 mg Intravenous Q4H PRN Eugenie Norrie, NP   20 mg at 10/19/18 1222  . insulin aspart (novoLOG) injection 0-9 Units  0-9 Units Subcutaneous Q4H Eugenie Norrie, NP   1 Units at 10/17/18 1700  . ipratropium-albuterol (DUONEB) 0.5-2.5 (3) MG/3ML nebulizer solution 3 mL  3 mL Nebulization Q6H PRN Eugenie Norrie, NP      . ipratropium-albuterol (DUONEB) 0.5-2.5 (3) MG/3ML nebulizer solution 3 mL  3 mL Nebulization Q4H Arbie Cookey, MD   3 mL at 10/19/18 1155  . levothyroxine (SYNTHROID, LEVOTHROID) tablet 100 mcg  100 mcg Oral Q0600 Arbie Cookey, MD   100 mcg at 10/19/18 0525  . MEDLINE  mouth rinse  15 mL Mouth Rinse BID Arbie Cookey, MD   15 mL at 10/19/18 1024  . methylPREDNISolone sodium succinate (SOLU-MEDROL) 40 mg/mL injection 40 mg  40 mg Intravenous Q12H Eugenie Norrie, NP   40 mg at 10/19/18 1222  . ondansetron (ZOFRAN) tablet 4 mg  4 mg Oral Q6H PRN Gouru, Aruna, MD       Or  . ondansetron (ZOFRAN) injection 4 mg  4 mg Intravenous Q6H PRN Gouru, Aruna, MD         Discharge Medications: Please see discharge summary for a list of discharge medications.  Relevant Imaging Results:  Relevant Lab Results:   Additional Information SSN: [id number]  Larwance Rote, LCSW

## 2018-10-19 NOTE — Progress Notes (Signed)
[employer] [facility] [facility] [place] [facility]   PATIENT NAME: Danielle OtterMarian Munoz    MR#:  [id number]  DATE OF BIRTH:  04/05/1941  SUBJECTIVE:  CHIEF COMPLAINT:  No chief complaint on file.  -Extubated on 10/17/2018.  Currently on BiPAP -Feels some better.  REVIEW OF SYSTEMS:  Review of Systems  Constitutional: Positive for malaise/fatigue. Negative for chills and fever.  HENT: Negative for congestion, ear discharge, hearing loss and nosebleeds.   Eyes: Negative for blurred vision and double vision.  Respiratory: Positive for shortness of breath and wheezing. Negative for cough.   Cardiovascular: Positive for leg swelling. Negative for chest pain and palpitations.  Gastrointestinal: Negative for abdominal pain, constipation, diarrhea, nausea and vomiting.  Genitourinary: Negative for dysuria.  Musculoskeletal: Negative for myalgias.  Neurological: Negative for dizziness, focal weakness, seizures, weakness and headaches.  Psychiatric/Behavioral: Negative for depression.       Confusion     DRUG ALLERGIES:  No Known Allergies  VITALS:  Blood pressure (!) 184/70, pulse 98, temperature 98.4 F (36.9 C), temperature source Oral, resp. rate (!) 23, weight 109.8 kg, SpO2 97 %.  PHYSICAL EXAMINATION:  Physical Exam   GENERAL:  78 y.o.-year-old obese patient lying in the bed with no acute distress.  EYES: Pupils equal, round, reactive to light and accommodation. No scleral icterus. Extraocular muscles intact.  HEENT: Head atraumatic, normocephalic. Oropharynx and nasopharynx clear.  Has a BiPAP mask on NECK:  Supple, no jugular venous distention. No thyroid enlargement, no tenderness.  LUNGS: Scant breath sounds bilaterally with decreased at the bases.  No wheezing, rales,rhonchi or crepitation. No use of accessory muscles of respiration.  CARDIOVASCULAR: S1, S2 normal. No  rubs, or gallops.  2/6 systolic murmur is present ABDOMEN: Soft, nontender, nondistended. Bowel sounds  present. No organomegaly or mass.  EXTREMITIES: No  cyanosis, or clubbing.  1+ lower extremity edema noted, evidence of skin changes from chronic lower extremity edema NEUROLOGIC: Cranial nerves II through XII are intact. Muscle strength 5/5 in all extremities. Sensation intact. Gait not checked.  PSYCHIATRIC: The patient is alert and oriented x 3.  Intermittent confusion noted SKIN: No obvious rash, lesion, or ulcer.    LABORATORY PANEL:   CBC Recent Labs  Lab 10/19/18 0509  WBC 17.2*  HGB 12.2  HCT 43.4  PLT 213   ------------------------------------------------------------------------------------------------------------------  Chemistries  Recent Labs  Lab 10/16/18 1200  10/19/18 0509  NA 144   < > 146*  K 4.5   < > 3.5  CL 101   < > 103  CO2 38*   < > 36*  GLUCOSE 150*   < > 118*  BUN 31*   < > 49*  CREATININE 1.65*   < > 1.18*  CALCIUM 8.7*   < > 8.7*  AST 21  --   --   ALT 10  --   --   ALKPHOS 94  --   --   BILITOT 0.8  --   --    < > = values in this interval not displayed.   ------------------------------------------------------------------------------------------------------------------  Cardiac Enzymes Recent Labs  Lab 10/16/18 2314  TROPONINI 0.04*   ------------------------------------------------------------------------------------------------------------------  RADIOLOGY:  Dg Chest Port 1 View  Result Date: 10/19/2018 CLINICAL DATA:  Acute respiratory failure EXAM: PORTABLE CHEST 1 VIEW COMPARISON:  10/18/2018 chest radiograph. FINDINGS: Stable configuration of single lead left subclavian pacemaker. Stable cardiomediastinal silhouette with mild cardiomegaly. No pneumothorax. Stable small left pleural effusion. Mild-to-moderate pulmonary edema, stable. IMPRESSION: 1. Stable  mild-to-moderate congestive heart failure. 2. Stable small left pleural effusion. Electronically Signed   By: Delbert Phenix M.D.   On: 10/19/2018 07:16   Dg Chest Port 1  View  Result Date: 10/18/2018 CLINICAL DATA:  Acute respiratory failure EXAM: PORTABLE CHEST 1 VIEW COMPARISON:  Chest radiograph from one day prior. FINDINGS: Interval extubation with removal of enteric tube. Stable configuration of single lead left subclavian pacemaker. Stable cardiomediastinal silhouette with mild cardiomegaly. No pneumothorax. Stable small left pleural effusion. No significant right pleural effusion. Mild-to-moderate pulmonary edema not appreciably changed. Mild bibasilar atelectasis, decreased. IMPRESSION: 1. Stable mild-to-moderate congestive heart failure. 2. Stable small left pleural effusion. 3. Mild bibasilar atelectasis, decreased. Electronically Signed   By: Delbert Phenix M.D.   On: 10/18/2018 07:46    EKG:   Orders placed or performed during the hospital encounter of 10/16/18  . ED EKG 12-Lead  . ED EKG 12-Lead    ASSESSMENT AND PLAN:   78 year old female with past medical history significant for a flutter/fib on Eliquis, sick sinus syndrome status post pacemaker, COPD, CKD, hypertension, anemia and obesity who was just discharged from the hospital comes back again with respiratory distress.  1.  Acute hypoxic and hypercarbic respiratory failure-secondary to COPD exacerbation and pneumonia -Was intubated and extubated on 10/17/2018 -Still needing BiPAP on and off -Appreciate pulmonary input -Continue IV steroids, nebs and inhalers -Remains on antibiotics  2.  Sepsis-secondary to H CAP and aspiration pneumonia-follow-up with speech therapy consult  -Currently on cefepime.  Continue antibiotics -MRSA PCR is negative  3.  CHF-acute on chronic diastolic dysfunction- -see with IV Lasix yesterday.  BNP still elevated.  Chest x-ray with pulmonary edema -Restart Lasix daily  4.  Elevated troponin-demand ischemia.  No further cardiac work-up needed  5.  Hypothyroidism-Synthroid  6.  Chronic atrial flutter/fib-sick sinus syndrome status post pacemaker as  well -Rate controlled. -Continue Eliquis for anticoagulation  Physical therapy consult once off BiPAP recommended    All the records are reviewed and case discussed with Care Management/Social Workerr. Management plans discussed with the patient, family and they are in agreement.  CODE STATUS: Full code  TOTAL TIME TAKING CARE OF THIS PATIENT: 38 minutes.   POSSIBLE D/C IN 2 DAYS, DEPENDING ON CLINICAL CONDITION.   Enid Baas M.D on 10/19/2018 at 8:45 AM  Between 7am to 6pm - Pager - [phone]  After 6pm go to [email or web address] - Social research officer, government  [facility]  Office  [phone]  CC: Primary care physician; Clinic, [facility]

## 2018-10-19 NOTE — Consult Note (Signed)
Pharmacy Antibiotic Note  Danielle Munoz is a 78 y.o. female admitted on 10/16/2018 with pneumonia.  Pharmacy has been consulted for cefepime dosing.  Vancomycin d/c 10/17/2018 Extubated 10/17/2018  Plan:  Day 4 - Cefepime 2 gm IV every 12 hours for Crcl 45.6 ml/min    Weight: 242 lb 1 oz (109.8 kg)  Temp (24hrs), Avg:98.1 F (36.7 C), Min:97.8 F (36.6 C), Max:98.5 F (36.9 C)  Recent Labs  Lab 10/16/18 1200 10/16/18 1856 10/17/18 0409 10/17/18 0526 10/18/18 0355 10/19/18 0509  WBC 23.7*  --   --  16.7* 23.8* 17.2*  CREATININE 1.65*  --  1.67*  --  1.25* 1.18*  LATICACIDVEN 1.1 1.3  --   --   --   --     Estimated Creatinine Clearance: 45.6 mL/min (A) (by C-G formula based on SCr of 1.18 mg/dL (H)).    No Known Allergies  Antimicrobials this admission: Cefepime 1/16 >>  Vancomycin 1/16 >> 1/17  Dose adjustments this admission:   Microbiology results: 1/16 BCx: pending  1/16 MRSA PCR: pending collection  Thank you for allowing pharmacy to be a part of this patient's care.  Angelique BlonderMerrill,Lyndal Reggio A, PharmD Clinical Pharmacist 10/19/2018 11:40 AM

## 2018-10-20 DIAGNOSIS — J810 Acute pulmonary edema: Secondary | ICD-10-CM

## 2018-10-20 DIAGNOSIS — J9622 Acute and chronic respiratory failure with hypercapnia: Secondary | ICD-10-CM

## 2018-10-20 LAB — CBC WITH DIFFERENTIAL/PLATELET
Abs Immature Granulocytes: 0.11 10*3/uL — ABNORMAL HIGH (ref 0.00–0.07)
Basophils Absolute: 0 10*3/uL (ref 0.0–0.1)
Basophils Relative: 0 %
EOS ABS: 0 10*3/uL (ref 0.0–0.5)
Eosinophils Relative: 0 %
HEMATOCRIT: 41.6 % (ref 36.0–46.0)
Hemoglobin: 11.8 g/dL — ABNORMAL LOW (ref 12.0–15.0)
Immature Granulocytes: 1 %
Lymphocytes Relative: 3 %
Lymphs Abs: 0.3 10*3/uL — ABNORMAL LOW (ref 0.7–4.0)
MCH: 21.7 pg — ABNORMAL LOW (ref 26.0–34.0)
MCHC: 28.4 g/dL — ABNORMAL LOW (ref 30.0–36.0)
MCV: 76.3 fL — ABNORMAL LOW (ref 80.0–100.0)
Monocytes Absolute: 0.3 10*3/uL (ref 0.1–1.0)
Monocytes Relative: 3 %
NEUTROS PCT: 93 %
NRBC: 0 % (ref 0.0–0.2)
Neutro Abs: 8.6 10*3/uL — ABNORMAL HIGH (ref 1.7–7.7)
Platelets: 148 10*3/uL — ABNORMAL LOW (ref 150–400)
RBC: 5.45 MIL/uL — ABNORMAL HIGH (ref 3.87–5.11)
RDW: 24.8 % — ABNORMAL HIGH (ref 11.5–15.5)
Smear Review: NORMAL
WBC: 9.2 10*3/uL (ref 4.0–10.5)

## 2018-10-20 LAB — BASIC METABOLIC PANEL
Anion gap: 5 (ref 5–15)
BUN: 49 mg/dL — ABNORMAL HIGH (ref 8–23)
CO2: 34 mmol/L — AB (ref 22–32)
Calcium: 8.5 mg/dL — ABNORMAL LOW (ref 8.9–10.3)
Chloride: 105 mmol/L (ref 98–111)
Creatinine, Ser: 1.12 mg/dL — ABNORMAL HIGH (ref 0.44–1.00)
GFR calc non Af Amer: 48 mL/min — ABNORMAL LOW (ref >60)
GFR, EST AFRICAN AMERICAN: 55 mL/min — AB (ref >60)
Glucose, Bld: 144 mg/dL — ABNORMAL HIGH (ref 70–99)
Potassium: 4.2 mmol/L (ref 3.5–5.1)
Sodium: 144 mmol/L (ref 135–145)

## 2018-10-20 LAB — GLUCOSE, CAPILLARY
GLUCOSE-CAPILLARY: 123 mg/dL — AB (ref 70–99)
GLUCOSE-CAPILLARY: 116 mg/dL — AB (ref 70–99)
GLUCOSE-CAPILLARY: 109 mg/dL — AB (ref 70–99)
Glucose-Capillary: 129 mg/dL — ABNORMAL HIGH (ref 70–99)
Glucose-Capillary: 106 mg/dL — ABNORMAL HIGH (ref 70–99)
Glucose-Capillary: 113 mg/dL — ABNORMAL HIGH (ref 70–99)

## 2018-10-20 LAB — LEGIONELLA PNEUMOPHILA SEROGP 1 UR AG: L. pneumophila Serogp 1 Ur Ag: NEGATIVE

## 2018-10-20 MED ORDER — ADULT MULTIVITAMIN W/MINERALS CH
1.0000 | ORAL_TABLET | Freq: Every day | ORAL | Status: DC
  Administered 2018-10-21 – 2018-10-22 (×2): 1 via ORAL
  Filled 2018-10-20 (×2): qty 1

## 2018-10-20 MED ORDER — FAMOTIDINE 20 MG PO TABS
20.0000 mg | ORAL_TABLET | Freq: Every day | ORAL | Status: DC
  Administered 2018-10-20: 20 mg via ORAL
  Filled 2018-10-20: qty 1

## 2018-10-20 MED ORDER — ALPRAZOLAM 0.25 MG PO TABS
0.2500 mg | ORAL_TABLET | Freq: Once | ORAL | Status: AC
  Administered 2018-10-20: 0.25 mg via ORAL
  Filled 2018-10-20: qty 1

## 2018-10-20 MED ORDER — TRAZODONE HCL 50 MG PO TABS
50.0000 mg | ORAL_TABLET | Freq: Once | ORAL | Status: AC
  Administered 2018-10-20: 50 mg via ORAL
  Filled 2018-10-20: qty 1

## 2018-10-20 MED ORDER — ENSURE MAX PROTEIN PO LIQD
11.0000 [oz_av] | Freq: Two times a day (BID) | ORAL | Status: DC
  Administered 2018-10-20 – 2018-10-22 (×3): 11 [oz_av] via ORAL
  Filled 2018-10-20: qty 330

## 2018-10-20 MED ORDER — DEXTROSE 5 % IV SOLN
INTRAVENOUS | Status: DC
  Administered 2018-10-20: 14:00:00 via INTRAVENOUS

## 2018-10-20 MED ORDER — TRAZODONE HCL 50 MG PO TABS
50.0000 mg | ORAL_TABLET | Freq: Every evening | ORAL | Status: DC | PRN
  Administered 2018-10-20: 50 mg via ORAL
  Filled 2018-10-20 (×2): qty 1

## 2018-10-20 NOTE — Progress Notes (Signed)
Per Dr. Sung Amabile' order, Pt's Danielle Munoz decreased from 2.5L to 1L, so that SaO2 would drop from 95%.

## 2018-10-20 NOTE — Evaluation (Signed)
Physical Therapy Evaluation Patient Details Name: Danielle Munoz MRN: [id number] DOB: 13-Oct-1940 Today's Date: 10/20/2018   History of Present Illness  Pt is a 78 y.o. female with a known history of sick sinus syndrome status post recent pacemaker placement, CHF, CKD, COPD, hypertension and hypothyroidism and other medical problems came into the ED for respiratory distress.  EMS was called as the patient was not breathing well and the hospital nursing home staff has noticed froth from her mouth.  By the time patient arrived to the hospital she was unresponsive but had a pulse.  Pt intubated/extubated 1/17.  Assessment includes: Acute hypoxic and hypercarbic respiratory failure-secondary to COPD exacerbation and pneumonia, sepsis, acute on chronic CHF, elevated troponin due to demand ischemia, hypothyroidism, and acute delirium secondary to metabolic encephalopathy.      Clinical Impression  Pt presents with deficits in strength, transfers, mobility, gait, balance, and activity tolerance.  Pt was able to follow simple commands well during the session but was somewhat confused with difficulty providing a reliable history and was mildly agitated at times.  Pt required extensive +2 assist with bed mobility tasks and +2 Mod A with transfers from an elevated surface.  Pt with min instability upon initial stand but was able to amb 1 x 2' and 1 x 3' with a RW without physical assistance.  Nursing requested that pt be left in the recliner at the end of the session.  Pt will benefit from PT services in a SNF setting upon discharge to safely address above deficits for decreased caregiver assistance and eventual return to PLOF.     Follow Up Recommendations SNF    Equipment Recommendations  Other (comment)(TBD at next venue of care)    Recommendations for Other Services       Precautions / Restrictions Precautions Precautions: Fall Precaution Comments: Pt with pacemaker precautions, per chart, not allowed  to raise LUE overhead Restrictions Weight Bearing Restrictions: No      Mobility  Bed Mobility Overal bed mobility: Needs Assistance Bed Mobility: Supine to Sit     Supine to sit: +2 for physical assistance;Max assist     General bed mobility comments: Max A for BLE management and for trunk to full upright position  Transfers Overall transfer level: Needs assistance Equipment used: Rolling walker (2 wheeled) Transfers: Sit to/from Stand Sit to Stand: Mod assist;+2 physical assistance         General transfer comment: Mod verbal cues for sequencing  Ambulation/Gait Ambulation/Gait assistance: Min guard Gait Distance (Feet): 3 Feet x 1, 2 Feet x 1 Assistive device: Rolling walker (2 wheeled) Gait Pattern/deviations: Step-to pattern;Decreased step length - right;Decreased step length - left Gait velocity: Decreased   General Gait Details: Short B step length with flexed trunk posture but no LOB/instability noted  Stairs            Wheelchair Mobility    Modified Rankin (Stroke Patients Only)       Balance Overall balance assessment: Needs assistance Sitting-balance support: Feet supported Sitting balance-Leahy Scale: Good     Standing balance support: Bilateral upper extremity supported Standing balance-Leahy Scale: Fair                               Pertinent Vitals/Pain Pain Assessment: No/denies pain    Home Living Family/patient expects to be discharged to:: Skilled nursing facility(Pt is an unreliable historian, history from chart review and pt, needs verification; per  chart pt was at a SNF for STR prior to this admission) Living Arrangements: Other relatives               Additional Comments: Pt lives with grandson but is home alone during the day    Prior Function Level of Independence: Independent with assistive device(s)         Comments: Mod I with amb with a RW     Hand Dominance   Dominant Hand: Right     Extremity/Trunk Assessment   Upper Extremity Assessment Upper Extremity Assessment: Generalized weakness;LUE deficits/detail RUE Deficits / Details: overall WFLs for tasked assessed LUE Deficits / Details: LUE not assessed due to pacemaker precautions    Lower Extremity Assessment Lower Extremity Assessment: Generalized weakness       Communication   Communication: No difficulties  Cognition Arousal/Alertness: Awake/alert Behavior During Therapy: Impulsive;Agitated Overall Cognitive Status: No family/caregiver present to determine baseline cognitive functioning                                 General Comments: Pt able to follow commands throughout the session but was mildly confused with diffuculty providing clear history      General Comments      Exercises Total Joint Exercises Ankle Circles/Pumps: AROM;Both;10 reps Hip ABduction/ADduction: AAROM;Both;5 reps Straight Leg Raises: AAROM;Both;5 reps Long Arc Quad: AROM;Both;10 reps Knee Flexion: AROM;Both;10 reps Marching in Standing: AROM;Both;5 reps Other Exercises Other Exercises: Multiple sit to/from stand training from elevated surface with cues for sequencing   Assessment/Plan    PT Assessment Patient needs continued PT services  PT Problem List Decreased strength;Decreased activity tolerance;Decreased balance;Decreased mobility;Decreased knowledge of use of DME       PT Treatment Interventions DME instruction;Therapeutic exercise;Gait training;Balance training;Functional mobility training;Therapeutic activities;Patient/family education;Stair training    PT Goals (Current goals can be found in the Care Plan section)  Acute Rehab PT Goals Patient Stated Goal: To get stronger PT Goal Formulation: With patient Time For Goal Achievement: 11/02/18 Potential to Achieve Goals: Good    Frequency Min 2X/week   Barriers to discharge Inaccessible home environment;Decreased caregiver support       Co-evaluation               AM-PAC PT "6 Clicks" Mobility  Outcome Measure Help needed turning from your back to your side while in a flat bed without using bedrails?: A Lot Help needed moving from lying on your back to sitting on the side of a flat bed without using bedrails?: A Lot Help needed moving to and from a bed to a chair (including a wheelchair)?: A Lot Help needed standing up from a chair using your arms (e.g., wheelchair or bedside chair)?: A Lot Help needed to walk in hospital room?: Total Help needed climbing 3-5 steps with a railing? : Total 6 Click Score: 10    End of Session Equipment Utilized During Treatment: Gait belt;Oxygen;Other (comment) Activity Tolerance: Patient tolerated treatment well Patient left: in chair;with call bell/phone within reach;with nursing/sitter in room(Pt left with nursing who was completing chair set-up for patient at end of session) Nurse Communication: Mobility status PT Visit Diagnosis: Muscle weakness (generalized) (M62.81);Difficulty in walking, not elsewhere classified (R26.2)    Time: 1110-1140 PT Time Calculation (min) (ACUTE ONLY): 30 min   Charges:   PT Evaluation $PT Eval Moderate Complexity: 1 Mod PT Treatments $Therapeutic Activity: 8-22 mins  Ovidio Hanger PT, DPT 10/20/18, 1:05 PM

## 2018-10-20 NOTE — Progress Notes (Signed)
Somewhat lethargic on BiPAP and dexmedetomidine infusion.  No distress.  Follows commands  Vitals:   10/20/18 0818 10/20/18 0900 10/20/18 1000 10/20/18 1100  BP:  (!) 162/56 (!) 149/64 (!) 154/60  Pulse: (!) 53 (!) 57 (!) 58 60  Resp: 18 14 13  (!) 26  Temp:      TempSrc:      SpO2: 97% 99% 99% 96%  Weight:      Height:       NAD on BIPAP HEENT WNL No JVD noted Chest clear anteriorly RRR, no M NABS, soft ext warm, no edema No focal deficits noted  BMP Latest Ref Rng & Units 10/20/2018 10/19/2018 10/18/2018  Glucose 70 - 99 mg/dL 633(H) 545(G) 256(L)  BUN 8 - 23 mg/dL 89(H) 73(S) 28(J)  Creatinine 0.44 - 1.00 mg/dL 6.81(L) 5.72(I) 2.03(T)  Sodium 135 - 145 mmol/L 144 146(H) 147(H)  Potassium 3.5 - 5.1 mmol/L 4.2 3.5 3.7  Chloride 98 - 111 mmol/L 105 103 104  CO2 22 - 32 mmol/L 34(H) 36(H) 35(H)  Calcium 8.9 - 10.3 mg/dL 5.9(R) 4.1(U) 8.9   CBC Latest Ref Rng & Units 10/20/2018 10/19/2018 10/18/2018  WBC 4.0 - 10.5 K/uL 9.2 17.2(H) 23.8(H)  Hemoglobin 12.0 - 15.0 g/dL 11.8(L) 12.2 12.4  Hematocrit 36.0 - 46.0 % 41.6 43.4 44.3  Platelets 150 - 400 K/uL 148(L) 213 220   CXR: No new film  IMP: Acute/chronic hypoxic/hypercarbic respiratory failure Pulmonary edema COPD exacerbation Doubt pneumonia  Combined systolic/diastolic CHF Chronic atrial fibrillation Permanent pacemaker Acute/chronic renal insufficiency Acute encephalopathy  PLAN/REC: Discontinue dexmedetomidine Wean BiPAP to off as tolerated Supplemental oxygen as needed to maintain SPO2 >90% Discontinue antibiotics Monitor and SDU through today.  If able to tolerate off BiPAP all day, will transfer to MedSurg 1/21  Billy Fischer, MD PCCM service Mobile [phone] Pager [phone] 10/20/2018 12:52 PM

## 2018-10-20 NOTE — Progress Notes (Signed)
   10/20/18 2059  Clinical Encounter Type  Visited With Patient  Visit Type Follow-up  Referral From Nurse  Consult/Referral To Chaplain  Spiritual Encounters  Spiritual Needs Prayer;Emotional  CH entered room. Pt was very talkative. Desired someone to sit and talk with her all night. CH shared that he cannot do so but will spend time with patient. Patient struggled to hold a meaningful conversation. Provided pastoral care through music, singing, scripture, devotional thought and prayer. Promised a CH will visit in morning.

## 2018-10-20 NOTE — Progress Notes (Signed)
PHARMACIST - PHYSICIAN COMMUNICATION  CONCERNING: IV to Oral Route Change Policy  RECOMMENDATION: This patient is receiving famotidine by the intravenous route.  Based on criteria approved by the Pharmacy and Therapeutics Committee, the intravenous medication(s) is/are being converted to the equivalent oral dose form(s).   DESCRIPTION: These criteria include:  The patient is eating (either orally or via tube) and/or has been taking other orally administered medications for a least 24 hours  The patient has no evidence of active gastrointestinal bleeding or impaired GI absorption (gastrectomy, short bowel, patient on TNA or NPO).  If you have questions about this conversion, please contact the Pharmacy Department  []   [phone] )  [facility]  [place], [facility] 10/20/2018 9:42 AM

## 2018-10-20 NOTE — Progress Notes (Signed)
Per Dr. Sung Amabile' order, Pt's Danielle Munoz O2 set to 1.5 L, SaO2 goal is 88-92%

## 2018-10-20 NOTE — Progress Notes (Signed)
Nutrition Follow-up  DOCUMENTATION CODES:   Morbid obesity  INTERVENTION:  Provide Ensure Max protein supplement BID, each supplement provides 150kcal and 30g of protein.  Provide daily MVI.  Consider increasing bowel regimen as patient has still not had a BM this admission.  NUTRITION DIAGNOSIS:   Inadequate oral intake related to inability to eat(pt sedated and ventilated ) as evidenced by NPO status.  Resolving - patient was extubated and started on a diet.  GOAL:   Patient will meet greater than or equal to 90% of their needs  Progressing.  MONITOR:   PO intake, Supplement acceptance, Diet advancement, Labs, Weight trends, I & O's  REASON FOR ASSESSMENT:   Ventilator    ASSESSMENT:   79 yo female admitted with acute encephalopathy of unknown etiology and acute on chronic respiratory failure secondary to vascular congestion, AECOPD, and pneumonia requiring mechanical intubation   Patient was extubated on 1/17. On 1/19 diet was advanced to dysphagia 2 with thin liquids. No documentation of intake from 1/19. Patient has been on BiPAP this morning. She did not eat breakfast today. Plan is to try to come off BiPAP some today. Patient has still not had a BM this admission.  Medications reviewed and include: Eliquis, Colace 100 mg BID, famotidine, Lasix 20 mg IV 2 doses today, Novolog 0-9 units Q4hrs, levothyroxine.  Labs reviewed: CBG 116-129, CO2 34, BUN 49, Creatinine 1.12.  Discussed with RN and on rounds.  Diet Order:   Diet Order            DIET DYS 2 Room service appropriate? Yes; Fluid consistency: Thin  Diet effective now             EDUCATION NEEDS:   Not appropriate for education at this time  Skin:  Skin Assessment: Reviewed RN Assessment(cellulitis, incision chest )  Last BM:  PTA  Height:   Ht Readings from Last 1 Encounters:  10/19/18 5' (1.524 m)   Weight:   Wt Readings from Last 1 Encounters:  10/16/18 109.8 kg   Ideal Body  Weight:  45.4 kg  BMI:  Body mass index is 47.28 kg/m.  Estimated Nutritional Needs:   Kcal:  1800-2000  Protein:  80-90 grams  Fluid:  1.4L/day or per MD  Helane Rima, MS, RD, LDN Office: [phone] Pager: [phone] After Hours/Weekend Pager: [phone]

## 2018-10-20 NOTE — Progress Notes (Signed)
[employer] [facility] [facility] [place] at [facility]   PATIENT NAME: Danielle Munoz    MR#:  [id number]  DATE OF BIRTH:  11-20-1940  SUBJECTIVE:  CHIEF COMPLAINT:  No chief complaint on file.  -Extubated on 10/17/2018.  Currently on BiPAP -Very confused.  On Precedex drip.  REVIEW OF SYSTEMS:  Review of Systems  Unable to perform ROS: Mental status change    DRUG ALLERGIES:  No Known Allergies  VITALS:  Blood pressure (!) 167/65, pulse (!) 53, temperature 97.6 F (36.4 C), temperature source Axillary, resp. rate 18, height 5' (1.524 m), weight 109.8 kg, SpO2 97 %.  PHYSICAL EXAMINATION:  Physical Exam   GENERAL:  78 y.o.-year-old obese patient lying in the bed with no acute distress.  EYES: Pupils equal, round, reactive to light and accommodation. No scleral icterus. Extraocular muscles intact.  HEENT: Head atraumatic, normocephalic. Oropharynx and nasopharynx clear.  Has a BiPAP mask on NECK:  Supple, no jugular venous distention. No thyroid enlargement, no tenderness.  LUNGS: Moving air bilaterally with decreased at the bases.  No wheezing, rales,rhonchi or crepitation. No use of accessory muscles of respiration.  CARDIOVASCULAR: S1, S2 normal. No  rubs, or gallops.  2/6 systolic murmur is present ABDOMEN: Soft, nontender, nondistended. Bowel sounds present. No organomegaly or mass.  EXTREMITIES: No  cyanosis, or clubbing.  1+ lower extremity edema noted, evidence of skin changes from chronic lower extremity edema NEUROLOGIC: Cranial nerves appear intact.  Able to move all extremities in bed.  Appears very confused and not following commands.  Talking to herself this morning.  PSYCHIATRIC: The patient is alert but confused and moving arms and talking to herself. SKIN: No obvious rash, lesion, or ulcer.    LABORATORY PANEL:   CBC Recent Labs  Lab 10/20/18 0430  WBC 9.2  HGB 11.8*  HCT 41.6  PLT 148*    ------------------------------------------------------------------------------------------------------------------  Chemistries  Recent Labs  Lab 10/16/18 1200  10/20/18 0430  NA 144   < > 144  K 4.5   < > 4.2  CL 101   < > 105  CO2 38*   < > 34*  GLUCOSE 150*   < > 144*  BUN 31*   < > 49*  CREATININE 1.65*   < > 1.12*  CALCIUM 8.7*   < > 8.5*  AST 21  --   --   ALT 10  --   --   ALKPHOS 94  --   --   BILITOT 0.8  --   --    < > = values in this interval not displayed.   ------------------------------------------------------------------------------------------------------------------  Cardiac Enzymes Recent Labs  Lab 10/16/18 2314  TROPONINI 0.04*   ------------------------------------------------------------------------------------------------------------------  RADIOLOGY:  Dg Chest Port 1 View  Result Date: 10/19/2018 CLINICAL DATA:  Acute respiratory failure EXAM: PORTABLE CHEST 1 VIEW COMPARISON:  10/18/2018 chest radiograph. FINDINGS: Stable configuration of single lead left subclavian pacemaker. Stable cardiomediastinal silhouette with mild cardiomegaly. No pneumothorax. Stable small left pleural effusion. Mild-to-moderate pulmonary edema, stable. IMPRESSION: 1. Stable mild-to-moderate congestive heart failure. 2. Stable small left pleural effusion. Electronically Signed   By: Delbert Phenix M.D.   On: 10/19/2018 07:16    EKG:   Orders placed or performed during the hospital encounter of 10/16/18  . ED EKG 12-Lead  . ED EKG 12-Lead    ASSESSMENT AND PLAN:   78 year old female with past medical history significant for a flutter/fib on Eliquis, sick sinus syndrome status post pacemaker, COPD, CKD,  hypertension, anemia and obesity who was just discharged from the hospital comes back again with respiratory distress.  1.  Acute hypoxic and hypercarbic respiratory failure-secondary to COPD exacerbation and pneumonia -Was intubated and extubated on 10/17/2018 -Still  needing BiPAP on and off -Appreciate pulmonary input -on IV steroids, nebs and inhalers -Remains on antibiotics  2.  Sepsis-secondary to HCAP and aspiration pneumonia-follow-up with speech therapy consult  -Currently on cefepime.  Continue antibiotics -MRSA PCR is negative  3.  CHF-acute on chronic diastolic dysfunction- -see with IV Lasix yesterday.  BNP still elevated.  Chest x-ray with pulmonary edema -Restarted Lasix   4.  Elevated troponin-demand ischemia.  No further cardiac work-up needed  5.  Hypothyroidism-Synthroid  6.  Chronic atrial flutter/fib-sick sinus syndrome status post pacemaker as well -Rate controlled. -Continue Eliquis for anticoagulation  7.  Acute delirium-secondary to metabolic encephalopathy and being in hospital -Remains on Precedex drip.  Wean steroids as tolerated.  Monitor closely.  No focal neurological deficits  Physical therapy consult once off BiPAP recommended    All the records are reviewed and case discussed with Care Management/Social Workerr. Management plans discussed with the patient, family and they are in agreement.  CODE STATUS: Full code  TOTAL TIME TAKING CARE OF THIS PATIENT: 32 minutes.   POSSIBLE D/C IN 2 DAYS, DEPENDING ON CLINICAL CONDITION.   Enid Baas M.D on 10/20/2018 at 8:35 AM  Between 7am to 6pm - Pager - [phone]  After 6pm go to [email or web address] - Social research officer, government  [facility]  Office  [phone]  CC: Primary care physician; Clinic, [facility]

## 2018-10-21 ENCOUNTER — Inpatient Hospital Stay: Payer: Medicare Other

## 2018-10-21 DIAGNOSIS — R410 Disorientation, unspecified: Secondary | ICD-10-CM

## 2018-10-21 LAB — CBC
HCT: 42.3 % (ref 36.0–46.0)
Hemoglobin: 11.8 g/dL — ABNORMAL LOW (ref 12.0–15.0)
MCH: 21.6 pg — ABNORMAL LOW (ref 26.0–34.0)
MCHC: 27.9 g/dL — ABNORMAL LOW (ref 30.0–36.0)
MCV: 77.5 fL — ABNORMAL LOW (ref 80.0–100.0)
Platelets: 230 10*3/uL (ref 150–400)
RBC: 5.46 MIL/uL — ABNORMAL HIGH (ref 3.87–5.11)
RDW: 24.5 % — ABNORMAL HIGH (ref 11.5–15.5)
WBC: 15 10*3/uL — AB (ref 4.0–10.5)
nRBC: 0 % (ref 0.0–0.2)

## 2018-10-21 LAB — BASIC METABOLIC PANEL
ANION GAP: 6 (ref 5–15)
BUN: 47 mg/dL — ABNORMAL HIGH (ref 8–23)
CO2: 38 mmol/L — ABNORMAL HIGH (ref 22–32)
Calcium: 8.6 mg/dL — ABNORMAL LOW (ref 8.9–10.3)
Chloride: 101 mmol/L (ref 98–111)
Creatinine, Ser: 1.06 mg/dL — ABNORMAL HIGH (ref 0.44–1.00)
GFR calc Af Amer: 59 mL/min — ABNORMAL LOW (ref >60)
GFR calc non Af Amer: 51 mL/min — ABNORMAL LOW (ref >60)
Glucose, Bld: 104 mg/dL — ABNORMAL HIGH (ref 70–99)
Potassium: 4.1 mmol/L (ref 3.5–5.1)
Sodium: 145 mmol/L (ref 135–145)

## 2018-10-21 LAB — CULTURE, BLOOD (ROUTINE X 2)
Culture: NO GROWTH
Culture: NO GROWTH
SPECIAL REQUESTS: ADEQUATE
Special Requests: ADEQUATE

## 2018-10-21 LAB — GLUCOSE, CAPILLARY
Glucose-Capillary: 94 mg/dL (ref 70–99)
Glucose-Capillary: 72 mg/dL (ref 70–99)

## 2018-10-21 MED ORDER — POLYETHYLENE GLYCOL 3350 17 G PO PACK: 17.0000 g | PACK | Freq: Every day | ORAL | Status: DC | PRN

## 2018-10-21 MED ORDER — BUDESONIDE 0.25 MG/2ML IN SUSP
0.2500 mg | Freq: Two times a day (BID) | RESPIRATORY_TRACT | Status: DC
  Administered 2018-10-21 – 2018-10-22 (×2): 0.25 mg via RESPIRATORY_TRACT
  Filled 2018-10-21 (×3): qty 2

## 2018-10-21 MED ORDER — ACETAMINOPHEN 325 MG PO TABS
650.0000 mg | ORAL_TABLET | Freq: Four times a day (QID) | ORAL | Status: DC | PRN
  Administered 2018-10-21: 650 mg via ORAL
  Filled 2018-10-21: qty 2

## 2018-10-21 MED ORDER — RISPERIDONE 0.5 MG PO TABS
0.5000 mg | ORAL_TABLET | Freq: Every day | ORAL | Status: DC
  Administered 2018-10-21: 23:00:00 0.5 mg via ORAL
  Filled 2018-10-21 (×3): qty 1

## 2018-10-21 MED ORDER — FUROSEMIDE 40 MG PO TABS
40.0000 mg | ORAL_TABLET | Freq: Every day | ORAL | Status: DC
  Administered 2018-10-22: 40 mg via ORAL
  Filled 2018-10-21: qty 1

## 2018-10-21 NOTE — Progress Notes (Signed)
[employer] - [place] at [facility]   PATIENT NAME: Danielle Munoz    MR#:  [id number]  DATE OF BIRTH:  1940/12/19  SUBJECTIVE:  CHIEF COMPLAINT:  No chief complaint on file.  -Off BiPAP this morning.  On 3 L oxygen.  Still some shortness of breath noted. -Mental status is much more clear  REVIEW OF SYSTEMS:  Review of Systems  Constitutional: Negative for chills, fever and malaise/fatigue.  HENT: Negative for congestion, ear discharge, hearing loss and nosebleeds.   Eyes: Negative for blurred vision and double vision.  Respiratory: Positive for cough and shortness of breath. Negative for wheezing.   Cardiovascular: Negative for chest pain and palpitations.  Gastrointestinal: Negative for abdominal pain, constipation, diarrhea, nausea and vomiting.  Genitourinary: Negative for dysuria.  Neurological: Negative for dizziness, seizures and headaches.    DRUG ALLERGIES:  No Known Allergies  VITALS:  Blood pressure 137/77, pulse 60, temperature 99 F (37.2 C), temperature source Oral, resp. rate 15, height 5' (1.524 m), weight 109.8 kg, SpO2 99 %.  PHYSICAL EXAMINATION:  Physical Exam   GENERAL:  78 y.o.-year-old obese patient lying in the bed with no acute distress.  EYES: Pupils equal, round, reactive to light and accommodation. No scleral icterus. Extraocular muscles intact.  HEENT: Head atraumatic, normocephalic. Oropharynx and nasopharynx clear.  NECK:  Supple, no jugular venous distention. No thyroid enlargement, no tenderness.  LUNGS: Moving air bilaterally with decreased at the bases.  No wheezing, rales,rhonchi or crepitation. No use of accessory muscles of respiration.  CARDIOVASCULAR: S1, S2 normal. No  rubs, or gallops.  2/6 systolic murmur is present ABDOMEN: Soft, nontender, nondistended. Bowel sounds present. No organomegaly or mass.  EXTREMITIES: No  cyanosis, or clubbing.  1+ lower extremity edema noted, evidence of skin changes from chronic  lower extremity edema NEUROLOGIC: Cranial nerves appear intact.  Muscle strength is 5/5 in all extremities.  Sensation is intact.  Gait is not tested at this time PSYCHIATRIC: The patient is alert and oriented x3.  Minimal confusion in between SKIN: No obvious rash, lesion, or ulcer.    LABORATORY PANEL:   CBC Recent Labs  Lab 10/21/18 0324  WBC 15.0*  HGB 11.8*  HCT 42.3  PLT 230   ------------------------------------------------------------------------------------------------------------------  Chemistries  Recent Labs  Lab 10/16/18 1200  10/21/18 0324  NA 144   < > 145  K 4.5   < > 4.1  CL 101   < > 101  CO2 38*   < > 38*  GLUCOSE 150*   < > 104*  BUN 31*   < > 47*  CREATININE 1.65*   < > 1.06*  CALCIUM 8.7*   < > 8.6*  AST 21  --   --   ALT 10  --   --   ALKPHOS 94  --   --   BILITOT 0.8  --   --    < > = values in this interval not displayed.   ------------------------------------------------------------------------------------------------------------------  Cardiac Enzymes Recent Labs  Lab 10/16/18 2314  TROPONINI 0.04*   ------------------------------------------------------------------------------------------------------------------  RADIOLOGY:  Dg Chest Port 1 View  Result Date: 10/21/2018 CLINICAL DATA:  Respiratory failure. EXAM: PORTABLE CHEST 1 VIEW COMPARISON:  10/19/2018. FINDINGS: Cardiac pacer with lead tip over the right ventricle. Cardiomegaly. Diffuse bilateral interstitial prominence consistent with CHF. Progressive left pleural effusion. No pneumothorax. IMPRESSION: 1. Cardiac pacer with lead tip over the right ventricle. Cardiomegaly. Diffuse bilateral interstitial prominence consistent CHF. 2.  Progressive left  pleural effusion. Electronically Signed   By: Maisie Fus  Register   On: 10/21/2018 06:07    EKG:   Orders placed or performed during the hospital encounter of 10/16/18  . ED EKG 12-Lead  . ED EKG 12-Lead    ASSESSMENT AND PLAN:     78 year old female with past medical history significant for a flutter/fib on Eliquis, sick sinus syndrome status post pacemaker, COPD, CKD, hypertension, anemia and obesity who was just discharged from the hospital comes back again with respiratory distress.  1.  Acute hypoxic and hypercarbic respiratory failure-secondary to COPD exacerbation and pneumonia -Was intubated and extubated on 10/17/2018 -Was needing BiPAP after extubation, none since last night.  Currently on 3 L oxygen acutely.  Patient uses nocturnal 2 L home O2 -Appreciate pulmonary input -on IV steroids, nebs and inhalers -Off antibiotics at this time.  Would recommend restarting her steroids considering her tight breath sounds on exam  2.  Sepsis-secondary to HCAP and aspiration pneumonia- -off antibiotics at this time.  Received cefepime in the hospital -MRSA PCR is negative  3.  CHF-acute on chronic diastolic dysfunction- -finished Lasix.  Follow-up most recent chest x-ray  4.  Elevated troponin-demand ischemia.  No further cardiac work-up needed  5.  Hypothyroidism-Synthroid  6.  Chronic atrial flutter/fib-sick sinus syndrome status post pacemaker as well -Rate controlled. -Continue Eliquis for anticoagulation  7.  Acute delirium-secondary to metabolic encephalopathy and being in hospital -Off Precedex drip.  Doing well.  Back to baseline  Physical therapy consulted and recommended rehab. -Encourage ambulation.  Most likely patient will be transferred to floor today    All the records are reviewed and case discussed with Care Management/Social Workerr. Management plans discussed with the patient, family and they are in agreement.  CODE STATUS: Full code  TOTAL TIME TAKING CARE OF THIS PATIENT: 37 minutes.   POSSIBLE D/C IN 1-2 DAYS, DEPENDING ON CLINICAL CONDITION.   Enid Baas M.D on 10/21/2018 at 8:43 AM  Between 7am to 6pm - Pager - [phone]  After 6pm go to [email or web address] - Geophysicist/field seismologist  [facility]  Office  [phone]  CC: Primary care physician; Clinic, [facility]

## 2018-10-21 NOTE — Progress Notes (Signed)
Report given to Joy RN for patient to be transferred to room 123, no personal belongings found at bedside. Patient stated she didn't think she had anything with her. Patient was asked if she needed the RN to call anyone about her transfer, patient stated no.

## 2018-10-21 NOTE — Progress Notes (Signed)
Physical Therapy Treatment Patient Details Name: Danielle Munoz MRN: [id number] DOB: [birth year] Today's Date: 10/21/2018    History of Present Illness Pt is a 78 y.o. female with a known history of sick sinus syndrome status post recent pacemaker placement, CHF, CKD, COPD, hypertension and hypothyroidism and other medical problems came into the ED for respiratory distress.  EMS was called as the patient was not breathing well and the hospital nursing home staff has noticed froth from her mouth.  By the time patient arrived to the hospital she was unresponsive but had a pulse.  Pt intubated/extubated 1/17.  Assessment includes: Acute hypoxic and hypercarbic respiratory failure-secondary to COPD exacerbation and pneumonia, sepsis, acute on chronic CHF, elevated troponin due to demand ischemia, hypothyroidism, and acute delirium secondary to metabolic encephalopathy.      PT Comments    Pt agreeable to PT; reports vague chronic pain B knees to feet. Pt reports feeling better overall, but weak. Participates in seated and stand exercises demonstrating increased time and Min A to stand. Requires BUE support to stand and to weight shift/perform limited stand exercises with quick fatigue. Increased reliance on BUEs with weight shift and lifting of 1 LE. Pt HR increases with short bouts of stand exercises from 63 to 97. Further concentration on seated exercises. Continue PT to progress strength and endurance to improve all functional mobility.    Follow Up Recommendations  SNF     Equipment Recommendations       Recommendations for Other Services       Precautions / Restrictions Precautions Precautions: Fall Restrictions Weight Bearing Restrictions: No    Mobility  Bed Mobility               General bed mobility comments: Not tested; up in chair  Transfers Overall transfer level: Needs assistance Equipment used: 1 person hand held assist Transfers: Sit to/from Stand Sit to Stand:  Min assist         General transfer comment: Performed 2x. Cues for sequencing; takes time to process/initiate movement  Ambulation/Gait                 Stairs             Wheelchair Mobility    Modified Rankin (Stroke Patients Only)       Balance Overall balance assessment: Needs assistance Sitting-balance support: Feet supported Sitting balance-Leahy Scale: Good     Standing balance support: Bilateral upper extremity supported Standing balance-Leahy Scale: Fair(moderate to heavy reliance on UEs ) Standing balance comment: Mod reliance on UEs with BLE stance; heavy with single                            Cognition Arousal/Alertness: Awake/alert Behavior During Therapy: WFL for tasks assessed/performed Overall Cognitive Status: Within Functional Limits for tasks assessed                                        Exercises General Exercises - Lower Extremity Long Arc Quad: AROM;Both;10 reps;Seated Straight Leg Raises: Strengthening;Both;5 reps;Standing Hip Flexion/Marching: Strengthening;Both;10 reps;Standing(also seated) Toe Raises: AROM;Both;10 reps;Seated Heel Raises: AROM;Both;10 reps;Seated    General Comments        Pertinent Vitals/Pain Pain Assessment: (Reports vague chronic knee to feet pain)    Home Living  Prior Function            PT Goals (current goals can now be found in the care plan section) Progress towards PT goals: Progressing toward goals    Frequency    Min 2X/week      PT Plan Current plan remains appropriate    Co-evaluation              AM-PAC PT "6 Clicks" Mobility   Outcome Measure  Help needed turning from your back to your side while in a flat bed without using bedrails?: A Little Help needed moving from lying on your back to sitting on the side of a flat bed without using bedrails?: A Lot Help needed moving to and from a bed to a chair  (including a wheelchair)?: A Little Help needed standing up from a chair using your arms (e.g., wheelchair or bedside chair)?: A Little Help needed to walk in hospital room?: A Lot Help needed climbing 3-5 steps with a railing? : Total 6 Click Score: 14    End of Session Equipment Utilized During Treatment: Gait belt;Oxygen Activity Tolerance: Patient limited by fatigue;Patient tolerated treatment well Patient left: in chair;with call bell/phone within reach   PT Visit Diagnosis: Muscle weakness (generalized) (M62.81);Difficulty in walking, not elsewhere classified (R26.2)     Time: 0174-9449 PT Time Calculation (min) (ACUTE ONLY): 24 min  Charges:  $Therapeutic Exercise: 8-22 mins $Therapeutic Activity: 8-22 mins                      Scot Dock, PTA 10/21/2018, 3:16 PM

## 2018-10-21 NOTE — Progress Notes (Signed)
She looks much better today.  She is sitting up in a chair.  Very conversant.  No distress.  No new complaints.  Vitals:   10/21/18 0910 10/21/18 1000 10/21/18 1235 10/21/18 1243  BP:  125/62 (!) 159/60   Pulse: 64 65 88   Resp: (!) 25 (!) 22 20   Temp:   98.4 F (36.9 C)   TempSrc:   Oral   SpO2: 90% 91% (!) 76% 95%  Weight:      Height:      1 LPM [place]  Mildly confused, no distress HEENT WNL Neck supple JVP not visualized Faint bibasilar crackles Few scattered wheezes Regular, no M Chronic lower extremity edema changes No focal neurologic deficits   BMP Latest Ref Rng & Units 10/21/2018 10/20/2018 10/19/2018  Glucose 70 - 99 mg/dL 628(Z) 662(H) 476(L)  BUN 8 - 23 mg/dL 46(T) 03(T) 46(F)  Creatinine 0.44 - 1.00 mg/dL 6.81(E) 7.51(Z) 0.01(V)  Sodium 135 - 145 mmol/L 145 144 146(H)  Potassium 3.5 - 5.1 mmol/L 4.1 4.2 3.5  Chloride 98 - 111 mmol/L 101 105 103  CO2 22 - 32 mmol/L 38(H) 34(H) 36(H)  Calcium 8.9 - 10.3 mg/dL 4.9(S) 4.9(Q) 7.5(F)   CBC Latest Ref Rng & Units 10/21/2018 10/20/2018 10/19/2018  WBC 4.0 - 10.5 K/uL 15.0(H) 9.2 17.2(H)  Hemoglobin 12.0 - 15.0 g/dL 11.8(L) 11.8(L) 12.2  Hematocrit 36.0 - 46.0 % 42.3 41.6 43.4  Platelets 150 - 400 K/uL 230 148(L) 213   CXR: Cardiomegaly, low lung volumes, bibasilar opacities, left pleural effusion  IMP: Acute/chronic hypoxic/hypercarbic respiratory failure Chronic pulmonary edema Small left pleural effusion COPD with mild exacerbation Combined systolic/diastolic CHF Chronic atrial fibrillation Permanent pacemaker Acute/chronic renal insufficiency Acute encephalopathy - improved  PLAN/REC: Supplemental oxygen as needed to maintain SPO2 88-93% Continue nebulized steroids and bronchodilators Change furosemide to daily dose of 40 mg Continue nocturnal BiPAP Low-dose risperidone at HS initiated 1/21 Palliative care consultation requested.  She is a very poor candidate for mechanical ventilation or ACLS Transfer to  MedSurg floor  After transfer, PCCM will sign off. Please call if we can be of further assistance  Billy Fischer, MD PCCM service Mobile [phone] Pager [phone] 10/21/2018 2:09 PM

## 2018-10-21 NOTE — Clinical Social Work Note (Signed)
CSW noted in chart review that patient is from [place] for short term rehab. CSW contacted Raiford Noble, admissions coordinator at [facility] to verify if patient can return. Raiford Noble states that patient can return when medically ready and will continue with rehab. CSW will continue to follow for transition of care.   Ruthe Mannan MSW, [address] [phone]

## 2018-10-21 NOTE — Progress Notes (Signed)
Keep pt on n/c and keep sat up >92% per dana Blakeney np

## 2018-10-22 ENCOUNTER — Ambulatory Visit: Payer: Medicare Other | Admitting: Family

## 2018-10-22 DIAGNOSIS — Z7189 Other specified counseling: Secondary | ICD-10-CM

## 2018-10-22 DIAGNOSIS — Z515 Encounter for palliative care: Secondary | ICD-10-CM

## 2018-10-22 LAB — BASIC METABOLIC PANEL
Anion gap: 4 — ABNORMAL LOW (ref 5–15)
BUN: 37 mg/dL — AB (ref 8–23)
CO2: 39 mmol/L — ABNORMAL HIGH (ref 22–32)
Calcium: 8.5 mg/dL — ABNORMAL LOW (ref 8.9–10.3)
Chloride: 101 mmol/L (ref 98–111)
Creatinine, Ser: 0.92 mg/dL (ref 0.44–1.00)
GFR calc Af Amer: >60 mL/min (ref >60)
GFR calc non Af Amer: >60 mL/min (ref >60)
GLUCOSE: 82 mg/dL (ref 70–99)
Potassium: 3.8 mmol/L (ref 3.5–5.1)
SODIUM: 144 mmol/L (ref 135–145)

## 2018-10-22 LAB — GLUCOSE, CAPILLARY
GLUCOSE-CAPILLARY: 65 mg/dL — AB (ref 70–99)
GLUCOSE-CAPILLARY: 93 mg/dL (ref 70–99)
Glucose-Capillary: 87 mg/dL (ref 70–99)
Glucose-Capillary: 95 mg/dL (ref 70–99)

## 2018-10-22 MED ORDER — RISPERIDONE 0.5 MG PO TABS: 0.5000 mg | ORAL_TABLET | Freq: Every day | ORAL | 0 refills | Status: DC

## 2018-10-22 NOTE — Progress Notes (Signed)
New referral for outpatient Palliative to follow at [facility] received from CSW Mora. Plan is for discharge today. Patient information faxed to referral. Dayna Barker RN, BSN, [facility] [place], [facility] [phone]

## 2018-10-22 NOTE — Consult Note (Signed)
Consultation Note Date: 10/22/2018   Patient Name: Danielle Munoz  DOB: September 24, 1941  MRN: [id number]  Age / Sex: 78 y.o., female  PCP: [facility] Referring Physician: Sela Hua, MD  Reason for Consultation: Establishing goals of care  HPI/Patient Profile: 78 y.o. 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 Patient was intubated in ED and transferred to ICU. She was treated for aspiration pna and sepsis initially. She was also treated for COPD exacerbation and CHF exacerbation. Extubated and transferred to floor. Patient has had multiple admissions - one was only 2 weeks ago. PMT consulted for [place].   Clinical Assessment and Goals of Care: I have reviewed medical records including EPIC notes, labs and imaging, received report from RN, assessed the patient and then met with the patient  to discuss diagnosis prognosis, GOC, EOL wishes, disposition and options.  I introduced Palliative Medicine as specialized medical care for people living with serious illness. It focuses on providing relief from the symptoms and stress of a serious illness. The goal is to improve quality of life for both the patient and the family.  We discussed a brief life review of the patient. She speaks a lot about her childhood. She also tells me a lot about her favorite foods and how they bring her joy. She tells me how much she loves to cook. She tells me of her career working at the [employer]. She has 2 daughters - Danielle Munoz and Danielle Munoz. She also has a sister, Danielle Munoz, who is involved in her care. Prior to going to rehab she was living at home with her grandson.   As far as functional and nutritional status, she tells me she feels very weak and has difficulty ambulating. She requires a walker for ambulation. She needs  assistance with ADLs. She speaks of a great appetite.    We discussed her current illness and what it means in the larger context of her on-going co-morbidities.  Natural disease trajectory and expectations at EOL were discussed. Specifically discussed COPD and CHF. We discussed how theese are not disease that we can fix, we can attempt to manage them, but they are chronic and progressive. Also discussed her debility.   I attempted to elicit values and goals of care important to the patient.  She tells me she is ready [place] She clarified and told me she was ready to die when the time comes.   The difference between aggressive medical intervention and comfort care was considered in light of the patient's goals of care. At present, patient would like to continue aggressive medical care - including repeated hospitalizations and rehab. However, she did set limitations to her care as discussed below.   Advance directives, concepts specific to code status, artifical feeding and hydration, and rehospitalization were considered and discussed. I discussed the differences of FULL code and DNR with Danielle Munoz - she tells me she would not want resuscitation attempts and want to pass naturally. I confirmed that she had elected DNR status.   I asked Danielle Munoz who she would want to be her surrogate decision maker if she were unable to make decisions for herself and she shares she would want her 2 daughters to this together. We discussed that they would be her legal decision makers as they are next of kin and HCPOA documentation is  not needed.   Hospice and Palliative Care services outpatient were explained and offered. Patient agrees to palliative care follow up at [facility].  I called patient's daughter, Danielle Munoz, to share the above conversation. Provided clinical update and answered questions. I shared with Danielle Munoz that patient had elected DNR status. Danielle Munoz agrees and tells me the patient has always  stated she would not want CPR or ventilator support. I also shared that palliative care would continue to see patient at rehab. She agrees to this as well.   Questions and concerns were addressed. The family was encouraged to call with questions or concerns.    Primary Decision Maker PATIENT - did confirm patient's decision's with daughter, Danielle Munoz  SUMMARY OF RECOMMENDATIONS   - Code status changed to DNR - Continue full scope treatment - D/c to rehab with outpatient palliative to follow - Patient and daughter educated about chronic, progressive nature of diseases and expectations   Code Status/Advance Care Planning:  DNR   Symptom Management:   Per primary - patient denies pain or shortness of breath  Palliative Prophylaxis:   Aspiration, Delirium Protocol and Frequent Pain Assessment  Additional Recommendations (Limitations, Scope, Preferences):  Full Scope Treatment  Psycho-social/Spiritual:   Desire for further Chaplaincy support:no  Additional Recommendations: Education on Hospice  Prognosis:   Unable to determine  Discharge Planning: [place] for rehab with Palliative care service follow-up      Primary Diagnoses: Present on Admission: . Acute respiratory failure ([place])   I have reviewed the medical record, interviewed the patient and family, and examined the patient. The following aspects are pertinent.  Past Medical History:  Diagnosis Date  . Atrial flutter ([place])    a. s/p TEE/DCCV 09/38 @ [facility] complicated by asystole and junctional rhythm requiring epi, atropine, and CPR x < 1 minute; b. amio and Eliquis; c. CHADS2VASC => 6 (CHF, HTN, age x 2, vascular disease, female)  . Chronic combined systolic and diastolic CHF (congestive heart failure) ([place])   . CKD (chronic kidney disease), stage II   . COPD (chronic obstructive pulmonary disease) ([place])   . Hypertension   . Hypothyroidism   . Iron deficiency anemia   . Morbid obesity ([place])     . Noncompliance    Social History   Socioeconomic History  . Marital status: Single    Spouse name: Not on file  . Number of children: Not on file  . Years of education: Not on file  . Highest education level: Not on file  Occupational History  . Not on file  Social Needs  . Financial resource strain: Not on file  . Food insecurity:    Worry: Not on file    Inability: Not on file  . Transportation needs:    Medical: Not on file    Non-medical: Not on file  Tobacco Use  . Smoking status: Former Smoker    Types: Cigarettes  . Smokeless tobacco: Never Used  Substance and Sexual Activity  . Alcohol use: No    Frequency: Never  . Drug use: No  . Sexual activity: Not on file  Lifestyle  . Physical activity:    Days per week: Not on file    Minutes per session: Not on file  . Stress: Not on file  Relationships  . Social connections:    Talks on phone: Not on file    Gets together: Not on file    Attends religious service: Not on file    Active member  of club or organization: Not on file    Attends meetings of clubs or organizations: Not on file    Relationship status: Not on file  Other Topics Concern  . Not on file  Social History Narrative  . Not on file   Family History  Problem Relation Age of Onset  . Diabetes Brother   . CAD Mother   . CAD Father   . Breast cancer Sister    Scheduled Meds: . apixaban  5 mg Oral BID  . atorvastatin  40 mg Oral q1800  . budesonide (PULMICORT) nebulizer solution  0.25 mg Nebulization BID  . docusate sodium  100 mg Oral BID  . furosemide  40 mg Oral Daily  . ipratropium-albuterol  3 mL Nebulization Q6H  . levothyroxine  100 mcg Oral Q0600  . mouth rinse  15 mL Mouth Rinse BID  . multivitamin with minerals  1 tablet Oral Daily  . ENSURE MAX PROTEIN  11 oz Oral BID  . risperiDONE  0.5 mg Oral QHS   Continuous Infusions: PRN Meds:.acetaminophen, albuterol, hydrALAZINE, [DISCONTINUED] ondansetron **OR** ondansetron  (ZOFRAN) IV, polyethylene glycol, traZODone No Known Allergies Review of Systems  Cardiovascular: Positive for leg swelling.  All other systems reviewed and are negative.   Physical Exam Constitutional:      General: She is not in acute distress.    Appearance: She is obese.  HENT:     Head: Normocephalic and atraumatic.  Cardiovascular:     Rate and Rhythm: Normal rate and regular rhythm.  Pulmonary:     Effort: Pulmonary effort is normal.     Breath sounds: Decreased air movement present.  Abdominal:     Palpations: Abdomen is soft.  Musculoskeletal:     Right lower leg: Edema present.     Left lower leg: Edema present.     Comments: Red BLE  Skin:    General: Skin is warm and dry.  Neurological:     Mental Status: She is alert and oriented to person, place, and time.     Vital Signs: BP (!) 153/68 (BP Location: Left Arm)   Pulse 76   Temp 98.2 F (36.8 C) (Oral)   Resp 18   Ht 5' (1.524 m)   Wt 109.8 kg   SpO2 96%   BMI 47.28 kg/m  Pain Scale: 0-10 POSS *See Group Information*: S-Acceptable,Sleep, easy to arouse Pain Score: Asleep   SpO2: SpO2: 96 % O2 Device:SpO2: 96 % O2 Flow Rate: .O2 Flow Rate (L/min): 2 L/min  IO: Intake/output summary:   Intake/Output Summary (Last 24 hours) at 10/22/2018 1224 Last data filed at 10/21/2018 2220 Gross per 24 hour  Intake -  Output 1000 ml  Net -1000 ml    LBM: Last BM Date: (PTA) Baseline Weight: Weight: 109.8 kg Most recent weight: Weight: 109.8 kg     Palliative Assessment/Data: PPS 40%   Flowsheet Rows     Most Recent Value  Intake Tab  Referral Department  Critical care  Unit at Time of Referral  ICU  Palliative Care Primary Diagnosis  Cardiac  Date Notified  10/21/18  Palliative Care Type  New Palliative care  Reason for referral  Clarify Goals of Care  Date of Admission  10/16/18  # of days IP prior to Palliative referral  5  Clinical Assessment  Psychosocial & Spiritual Assessment  Palliative  Care Outcomes      Time Total: 80 minutes Greater than 50%  of this time was spent  counseling and coordinating care related to the above assessment and plan.  Juel Burrow, DNP, AGNP-C Palliative Medicine Team [phone] Pager: [phone]

## 2018-10-22 NOTE — Discharge Summary (Signed)
[facility] NAME: Antonia Jicha    MR#:  [id number]  DATE OF BIRTH:  15-Dec-1940  DATE OF ADMISSION:  10/16/2018   ADMITTING PHYSICIAN: Nicholes Mango, MD  DATE OF DISCHARGE: 10/22/18  PRIMARY CARE PHYSICIAN: [facility]   ADMISSION DIAGNOSIS:  Acute respiratory failure with hypoxia ([place]) [J96.01] HCAP (healthcare-associated pneumonia) [J18.9] Sepsis with acute hypoxic respiratory failure without septic shock, due to unspecified organism ([place]) [A41.9, R65.20, J96.01] DISCHARGE DIAGNOSIS:  Active Problems:   Acute respiratory failure ([place])  SECONDARY DIAGNOSIS:   Past Medical History:  Diagnosis Date  . Atrial flutter ([place])    a. s/p TEE/DCCV 11/57 @ [facility] complicated by asystole and junctional rhythm requiring epi, atropine, and CPR x < 1 minute; b. amio and Eliquis; c. CHADS2VASC => 6 (CHF, HTN, age x 2, vascular disease, female)  . Chronic combined systolic and diastolic CHF (congestive heart failure) ([place])   . CKD (chronic kidney disease), stage II   . COPD (chronic obstructive pulmonary disease) ([place])   . Hypertension   . Hypothyroidism   . Iron deficiency anemia   . Morbid obesity ([place])   . Noncompliance    HOSPITAL COURSE:   Oviya is a 78 year old female who presented to the ED with shortness of breath.  She was felt to have likely aspiration pneumonia.  X-ray with patchy consolidation of the left lung base.  Started on IV antibiotics. She is admitted to the ICU.  1.  Acute hypoxic and hypercarbic respiratory failure- secondary to COPD exacerbation and pneumonia -Was intubated and extubated on 10/17/2018 -Required BiPAP briefly after extubation, but was stable on 2 L O2 at the time of discharge. -Received 5-day course of steroids -Received 5-day course of IV antibiotics -Seen by palliative care this admission- will need to continue outpatient palliative care on discharge  2.  Sepsis- secondary to HCAP  and aspiration pneumonia -Met sepsis criteria on admission with leukocytosis and hypothermia -Sepsis resolved -Received 5-day course of IV antibiotics -Blood cultures negative  3.  CHF- acute on chronic diastolic dysfunction- improved -Treated with IV Lasix, then transition to home torsemide at the time of discharge.  4.  Elevated troponin- felt to be secondary to demand ischemia.  No further cardiac work-up needed.  5.  Hypothyroidism- Continued synthroid  6.  Chronic atrial flutter/fib-sick sinus syndrome status post pacemaker as well -Continued metoprolol on discharge -Continued Eliquis for anticoagulation -Amiodarone held during hospitalization and was not restarted on discharge, due to patient being adequately rate controlled.  Can restart as an outpatient if needed.  7.  Acute delirium- secondary to metabolic encephalopathy and being in hospital.  -Required Precedex drip at this hospitalization -Started on risperidone qhs  DISCHARGE CONDITIONS:  COPD Chronic diastolic congestive heart failure Hypothyroidism Chronic atrial flutter/fib CONSULTS OBTAINED:  Treatment Team:  Sela Hua, MD DRUG ALLERGIES:  No Known Allergies DISCHARGE MEDICATIONS:   Allergies as of 10/22/2018   No Known Allergies     Medication List    STOP taking these medications   amiodarone 200 MG tablet Commonly known as:  PACERONE   cephALEXin 250 MG capsule Commonly known as:  KEFLEX     TAKE these medications   acetaminophen 325 MG tablet Commonly known as:  TYLENOL Take 650 mg by mouth every 6 (six) hours as needed for mild pain, moderate pain or fever.   albuterol 108 (90 Base) MCG/ACT inhaler Commonly known as:  PROVENTIL HFA;VENTOLIN  HFA Inhale 2 puffs into the lungs every 6 (six) hours as needed for wheezing or shortness of breath.   atorvastatin 40 MG tablet Commonly known as:  LIPITOR Take 40 mg by mouth at bedtime.   clotrimazole 1 % cream Commonly known as:   LOTRIMIN Apply 1 application topically 2 (two) times daily.   docusate sodium 100 MG capsule Commonly known as:  COLACE Take 200 mg by mouth daily.   ELIQUIS 5 MG Tabs tablet Generic drug:  apixaban Take 5 mg by mouth 2 (two) times daily.   ferrous sulfate 325 (65 FE) MG tablet Take 325 mg by mouth daily with breakfast.   guaifenesin 100 MG/5ML syrup Commonly known as:  ROBITUSSIN Take 100 mg by mouth every 4 (four) hours as needed for cough.   levothyroxine 100 MCG tablet Commonly known as:  SYNTHROID, LEVOTHROID Take 100 mcg by mouth daily before breakfast.   LORazepam 0.5 MG tablet Commonly known as:  ATIVAN Take 0.5 tablets (0.25 mg total) by mouth 2 (two) times daily as needed for anxiety (agitation).   Melatonin 3 MG Tabs Take 3 mg by mouth at bedtime as needed (sleep).   metoprolol succinate 50 MG 24 hr tablet Commonly known as:  TOPROL-XL Take 1 tablet (50 mg total) by mouth daily. Take with or immediately following a meal.   polyethylene glycol packet Commonly known as:  MIRALAX / GLYCOLAX Take 17 g by mouth daily as needed for mild constipation or moderate constipation.   REFRESH PLUS 0.5 % Soln Generic drug:  Carboxymethylcellulose Sod PF Place 1 drop into both eyes 3 (three) times daily as needed (dryness).   risperiDONE 0.5 MG tablet Commonly known as:  RISPERDAL Take 1 tablet (0.5 mg total) by mouth at bedtime.   senna-docusate 8.6-50 MG tablet Commonly known as:  Senokot-S Take 2 tablets by mouth 2 (two) times daily as needed for mild constipation or moderate constipation.   STIOLTO RESPIMAT 2.5-2.5 MCG/ACT Aers Generic drug:  Tiotropium Bromide-Olodaterol Inhale 2 puffs into the lungs daily.   torsemide 20 MG tablet Commonly known as:  DEMADEX Take 40 mg by mouth daily.        DISCHARGE INSTRUCTIONS:  1.  Follow-up with PCP in 5 days 2.  Follow-up with cardiology in 1 week 3.  Amiodarone not given this hospitalization and was not  restarted on discharge due to patient being rate controlled.  Can be restarted as needed as an outpatient. DIET:  Dysphagia II diet DISCHARGE CONDITION:  Stable ACTIVITY:  Activity as tolerated OXYGEN:  Home Oxygen: Yes.    Oxygen Delivery: 2 liters/min via Patient connected to nasal cannula oxygen DISCHARGE LOCATION:  nursing home   If you experience worsening of your admission symptoms, develop shortness of breath, life threatening emergency, suicidal or homicidal thoughts you must seek medical attention immediately by calling 911 or calling your MD immediately  if symptoms less severe.  You Must read complete instructions/literature along with all the possible adverse reactions/side effects for all the Medicines you take and that have been prescribed to you. Take any new Medicines after you have completely understood and accpet all the possible adverse reactions/side effects.   Please note  You were cared for by a hospitalist during your hospital stay. If you have any questions about your discharge medications or the care you received while you were in the hospital after you are discharged, you can call the unit and asked to speak with the hospitalist on call if the  hospitalist that took care of you is not available. Once you are discharged, your primary care physician will handle any further medical issues. Please note that NO REFILLS for any discharge medications will be authorized once you are discharged, as it is imperative that you return to your primary care physician (or establish a relationship with a primary care physician if you do not have one) for your aftercare needs so that they can reassess your need for medications and monitor your lab values.    On the day of Discharge:  VITAL SIGNS:  Blood pressure (!) 154/75, pulse 87, temperature 98.4 F (36.9 C), temperature source Oral, resp. rate 20, height 5' (1.524 m), weight 109.8 kg, SpO2 97 %. PHYSICAL EXAMINATION:  GENERAL:   78 y.o.-year-old patient lying in the bed with no acute distress.  EYES: Pupils equal, round, reactive to light and accommodation. No scleral icterus. Extraocular muscles intact.  HEENT: Head atraumatic, normocephalic. Oropharynx and nasopharynx clear.  NECK:  Supple, no jugular venous distention. No thyroid enlargement, no tenderness.  LUNGS: + Mild diffuse expiratory wheezing present, no use of accessory muscles of respiration.  Nasal cannula in place. CARDIOVASCULAR: RRR, S1, S2 normal. No murmurs, rubs, or gallops.  ABDOMEN: Soft, non-tender, non-distended. Bowel sounds present. No organomegaly or mass.  EXTREMITIES: No pedal edema, cyanosis, or clubbing.  NEUROLOGIC: Cranial nerves II through XII are intact. Muscle strength 5/5 in all extremities. Sensation intact. Gait not checked.  PSYCHIATRIC: The patient is alert and oriented x 3.  SKIN: No obvious rash, lesion, or ulcer.  DATA REVIEW:   CBC Recent Labs  Lab 10/21/18 0324  WBC 15.0*  HGB 11.8*  HCT 42.3  PLT 230    Chemistries  Recent Labs  Lab 10/16/18 1200  10/22/18 0639  NA 144   < > 144  K 4.5   < > 3.8  CL 101   < > 101  CO2 38*   < > 39*  GLUCOSE 150*   < > 82  BUN 31*   < > 37*  CREATININE 1.65*   < > 0.92  CALCIUM 8.7*   < > 8.5*  AST 21  --   --   ALT 10  --   --   ALKPHOS 94  --   --   BILITOT 0.8  --   --    < > = values in this interval not displayed.     Microbiology Results  Results for orders placed or performed during the hospital encounter of 10/16/18  Blood culture (routine x 2)     Status: None   Collection Time: 10/16/18 12:24 PM  Result Value Ref Range Status   Specimen Description BLOOD LEFT [facility]  Final   Special Requests   Final    BOTTLES DRAWN AEROBIC AND ANAEROBIC Blood Culture adequate volume   Culture   Final    NO GROWTH 5 DAYS Performed at [facility], [address] [place] [address] [place], [place] [address]    Report Status 10/21/2018 FINAL  Final  Blood culture (routine x  2)     Status: None   Collection Time: 10/16/18 12:25 PM  Result Value Ref Range Status   Specimen Description BLOOD LEFT ARM  Final   Special Requests   Final    BOTTLES DRAWN AEROBIC AND ANAEROBIC Blood Culture adequate volume   Culture   Final    NO GROWTH 5 DAYS Performed at [facility], [address], [place], [place] [address]  Report Status 10/21/2018 FINAL  Final  Culture, respiratory (non-expectorated)     Status: None   Collection Time: 10/16/18  5:07 PM  Result Value Ref Range Status   Specimen Description   Final    TRACHEAL ASPIRATE Performed at [facility], [address], [place], [place] [address]    Special Requests   Final    NONE Performed at [facility], [address] [place], [place] [address]    Gram Stain   Final    ABUNDANT WBC PRESENT,BOTH PMN AND MONONUCLEAR NO ORGANISMS SEEN    Culture   Final    NO GROWTH 2 DAYS Performed at [facility], [place] [address], [place], [place] [address]    Report Status 10/19/2018 FINAL  Final    RADIOLOGY:  No results found.   Management plans discussed with the patient, family and they are in agreement.  CODE STATUS: Full Code   TOTAL TIME TAKING CARE OF THIS PATIENT: 40 minutes.    Berna Spare Mayo M.D on 10/22/2018 at 8:02 AM  Between 7am to 6pm - Pager - [phone]  After 6pm go to [email or web address] - Proofreader  [facility] [place]  Office  [phone]  CC: Primary care physician; [facility]   Note: This dictation was prepared with Dragon dictation along with smaller phrase technology. Any transcriptional errors that result from this process are unintentional.

## 2018-10-22 NOTE — Discharge Instructions (Signed)
It was so nice to meet you during this hospitalization!  We have made the following medication changes: 1. STOP amiodarone for now. This medication may be restarted by your cardiologist. 2. START risperidone every night  Take care, Dr. Nancy Marus

## 2018-10-22 NOTE — Clinical Social Work Note (Signed)
Patient is medically ready for discharge today back to [facility]. CSW notified patient of discharge today. CSW also notified patient's daughter Luberta Mutter [phone] of discharge today. CSW also notified Rick at [facility] of discharge today. Patient will transport by EMS. RN to call report and call for transport.   Ruthe Mannan MSW, [address] [phone]

## 2018-10-22 NOTE — Care Management Important Message (Signed)
Important Message  Patient Details  Name: Danielle Munoz MRN: [id number] Date of Birth: [birth year]   Medicare Important Message Given:  Yes    Olegario Messier A Kevan Prouty 10/22/2018, 9:10 AM

## 2018-10-29 ENCOUNTER — Ambulatory Visit: Payer: Medicare Other | Admitting: Family

## 2018-11-04 ENCOUNTER — Non-Acute Institutional Stay: Payer: Medicare Other | Admitting: Student

## 2018-11-05 ENCOUNTER — Non-Acute Institutional Stay: Payer: Medicare Other | Admitting: Student

## 2018-11-05 VITALS — BP 130/80 | HR 56 | Resp 18 | Wt 228.6 lb

## 2018-11-05 DIAGNOSIS — Z515 Encounter for palliative care: Secondary | ICD-10-CM

## 2018-11-05 NOTE — Progress Notes (Signed)
[facility] Telephone: [phone] Fax: [phone]  PATIENT NAME: Danielle Munoz DOB: May 10, 1941 MRN: [id number]  PRIMARY CARE PROVIDER:   Dr. Drue Flirt  REFERRING PROVIDER: Harvest Dark, AGNP  RESPONSIBLE PARTY:  Daughter Danielle Munoz  ASSESSMENT: Patient observed sitting up to wheel chair; she had just completed physical therapy. Pleasant affect. She welcomes visit. Danielle Munoz provides some medical and life history review during visit. Explained role of Palliative Medicine. Danielle Munoz give verbal consent for Palliative Medicine to be involved in her care. Daughter Danielle Munoz, via phone also give consent. Discussed goals of care. Discussed symptom management. Discussed code status.     RECOMMENDATIONS and PLAN:  1. Code Status: DNR. 2. Medical goals of therapy: Danielle Munoz would like to continue therapy and be able to ambulate with walker again. 3. Symptom management: dyspnea-continue albuterol inhaler 2 puffs every 6 hours prn, continue Stiolto inhaler 2 puffs daily, oxygen via nasal canual at 2 liters per minute as directed. Edema-continue torsemide 40mg  BID. 4. Discharge planning: uncertain for final disposition; may stay at facility long-term. 5. Emotional/spiritual support: Discussed with Danielle Munoz, facility nurse Greggory Brandy and daughter Danielle Munoz via phone; they are encouraged to call with questions.   Palliative Care to continue to follow for emotional/spiritual support, ongoing discussions trajectory of chronic disease progression, medical goals of therapy, monitor for symptoms with management, and reduce ED and hospitalizations with recommendations.  Palliative Medicine to follow up in 4 weeks or sooner, if needed.   I spent 35 minutes providing this consultation,  from 10:55am  to 11:30am. More than 50% of the time in this consultation was spent coordinating communication.   HISTORY OF PRESENT ILLNESS:  Danielle Munoz is a 78 y.o.  female with multiple medical problems  including acute respiratory failure, COPD, bradycardia, sepsis, acute on chronic diastolic and systolic heart failure, CKD stage 2, hypothyroidism, hypertension, iron deficiency anemia, obesity. She was recently hospitalized 1/16-1/22/2020 due to acute respiratory failure with hypoxia, HCAP, sepsis and acute diastolic and systolic heart failure. She has been at [facility] for rehab. Per her daughter, Danielle Leash, Ms. Eggert may stay at facility long term. She previously lived at home with her grandson. She is receiving OT, PT and ST. She does require assistance with adl's. She requires assist x 1 for transfers. She has occasional episodes of bladder incontinence. She denies pain. She reports dyspnea with exertion; she has been wearing her oxygen prn; she states she wears it when working with therapy. She denies needing oxygen reapplied during visit. She reports a good appetite. She is being weight daily. New order on 10/30/2018 for torsemide 40mg  BID. She states edema to lower extremities have improved. Palliative Care was consulted to address goals of care.   CODE STATUS: DNR  PPS: 40% HOSPICE ELIGIBILITY/DIAGNOSIS: TBD  PAST MEDICAL HISTORY:  Past Medical History:  Diagnosis Date  . Atrial flutter (HCC)    a. s/p TEE/DCCV 10/18 @ [facility] complicated by asystole and junctional rhythm requiring epi, atropine, and CPR x < 1 minute; b. amio and Eliquis; c. CHADS2VASC => 6 (CHF, HTN, age x 2, vascular disease, female)  . Chronic combined systolic and diastolic CHF (congestive heart failure) (HCC)   . CKD (chronic kidney disease), stage II   . COPD (chronic obstructive pulmonary disease) (HCC)   . Hypertension   . Hypothyroidism   . Iron deficiency anemia   . Morbid obesity (HCC)   . Noncompliance     SOCIAL HX:  Social History  Tobacco Use  . Smoking status: Former Smoker    Types: Cigarettes  . Smokeless tobacco: Never Used  Substance Use Topics  . Alcohol use: No    Frequency: Never     ALLERGIES: No Known Allergies   PERTINENT MEDICATIONS:  Outpatient Encounter Medications as of 11/05/2018  Medication Sig  . acetaminophen (TYLENOL) 325 MG tablet Take 650 mg by mouth every 6 (six) hours as needed for mild pain, moderate pain or fever.  Marland Kitchen. albuterol (PROVENTIL HFA;VENTOLIN HFA) 108 (90 Base) MCG/ACT inhaler Inhale 2 puffs into the lungs every 6 (six) hours as needed for wheezing or shortness of breath.  Marland Kitchen. apixaban (ELIQUIS) 5 MG TABS tablet Take 5 mg by mouth 2 (two) times daily.  Marland Kitchen. atorvastatin (LIPITOR) 40 MG tablet Take 40 mg by mouth at bedtime.   . Carboxymethylcellulose Sod PF (REFRESH PLUS) 0.5 % SOLN Place 1 drop into both eyes 3 (three) times daily as needed (dryness).  . clotrimazole (LOTRIMIN) 1 % cream Apply 1 application topically 2 (two) times daily.  Marland Kitchen. docusate sodium (COLACE) 100 MG capsule Take 200 mg by mouth daily.  . ferrous sulfate 325 (65 FE) MG tablet Take 325 mg by mouth daily with breakfast.  . guaifenesin (ROBITUSSIN) 100 MG/5ML syrup Take 100 mg by mouth every 4 (four) hours as needed for cough.  . levothyroxine (SYNTHROID, LEVOTHROID) 100 MCG tablet Take 100 mcg by mouth daily before breakfast.  . LORazepam (ATIVAN) 0.5 MG tablet Take 0.5 tablets (0.25 mg total) by mouth 2 (two) times daily as needed for anxiety (agitation).  . Melatonin 3 MG TABS Take 3 mg by mouth at bedtime as needed (sleep).  . metoprolol succinate (TOPROL-XL) 50 MG 24 hr tablet Take 1 tablet (50 mg total) by mouth daily. Take with or immediately following a meal.  . polyethylene glycol (MIRALAX / GLYCOLAX) packet Take 17 g by mouth daily as needed for mild constipation or moderate constipation.  . risperiDONE (RISPERDAL) 0.5 MG tablet Take 1 tablet (0.5 mg total) by mouth at bedtime.  . senna-docusate (SENOKOT-S) 8.6-50 MG tablet Take 2 tablets by mouth 2 (two) times daily as needed for mild constipation or moderate constipation.  . Tiotropium Bromide-Olodaterol (STIOLTO  RESPIMAT) 2.5-2.5 MCG/ACT AERS Inhale 2 puffs into the lungs daily.  Marland Kitchen. torsemide (DEMADEX) 20 MG tablet Take 40 mg by mouth 2 (two) times daily.    No facility-administered encounter medications on file as of 11/05/2018.     PHYSICAL EXAM:   General: NAD Cardiovascular: bradycardia noted, regular rhythm Pulmonary: clear ant fields Abdomen: soft, nontender, + bowel sounds GU: no suprapubic tenderness Extremities: 2+ non-pitting edema Skin: no rashes Neurological: Weakness but otherwise nonfocal  Luella CookLaToya S Yunuen Mordan, NP

## 2018-11-13 ENCOUNTER — Ambulatory Visit: Payer: Medicare Other | Admitting: Family

## 2018-11-28 ENCOUNTER — Ambulatory Visit: Payer: Medicare Other | Admitting: Family

## 2018-11-29 ENCOUNTER — Emergency Department: Payer: Medicare Other

## 2018-11-29 ENCOUNTER — Emergency Department
Admission: EM | Admit: 2018-11-29 | Discharge: 2018-11-29 | Disposition: A | Discharge status: Home / Self Care | Payer: Medicare Other | Attending: Emergency Medicine | Admitting: Emergency Medicine

## 2018-11-29 DIAGNOSIS — I130 Hypertensive heart and chronic kidney disease with heart failure and stage 1 through stage 4 chronic kidney disease, or unspecified chronic kidney disease: Secondary | ICD-10-CM | POA: Diagnosis not present

## 2018-11-29 DIAGNOSIS — E039 Hypothyroidism, unspecified: Secondary | ICD-10-CM | POA: Insufficient documentation

## 2018-11-29 DIAGNOSIS — R079 Chest pain, unspecified: Secondary | ICD-10-CM | POA: Diagnosis present

## 2018-11-29 DIAGNOSIS — I5033 Acute on chronic diastolic (congestive) heart failure: Secondary | ICD-10-CM | POA: Diagnosis not present

## 2018-11-29 DIAGNOSIS — J449 Chronic obstructive pulmonary disease, unspecified: Secondary | ICD-10-CM | POA: Diagnosis not present

## 2018-11-29 DIAGNOSIS — N182 Chronic kidney disease, stage 2 (mild): Secondary | ICD-10-CM | POA: Diagnosis not present

## 2018-11-29 DIAGNOSIS — Z79899 Other long term (current) drug therapy: Secondary | ICD-10-CM | POA: Insufficient documentation

## 2018-11-29 DIAGNOSIS — I5023 Acute on chronic systolic (congestive) heart failure: Secondary | ICD-10-CM | POA: Insufficient documentation

## 2018-11-29 DIAGNOSIS — Z87891 Personal history of nicotine dependence: Secondary | ICD-10-CM | POA: Diagnosis not present

## 2018-11-29 LAB — CBC
HCT: 40 % (ref 36.0–46.0)
Hemoglobin: 11.8 g/dL — ABNORMAL LOW (ref 12.0–15.0)
MCH: 24.3 pg — ABNORMAL LOW (ref 26.0–34.0)
MCHC: 29.5 g/dL — ABNORMAL LOW (ref 30.0–36.0)
MCV: 82.5 fL (ref 80.0–100.0)
Platelets: 253 10*3/uL (ref 150–400)
RBC: 4.85 MIL/uL (ref 3.87–5.11)
RDW: 25.2 % — ABNORMAL HIGH (ref 11.5–15.5)
WBC: 11.5 10*3/uL — ABNORMAL HIGH (ref 4.0–10.5)
nRBC: 0 % (ref 0.0–0.2)

## 2018-11-29 LAB — BASIC METABOLIC PANEL
Anion gap: 8 (ref 5–15)
BUN: 27 mg/dL — ABNORMAL HIGH (ref 8–23)
CO2: 34 mmol/L — ABNORMAL HIGH (ref 22–32)
Calcium: 8.9 mg/dL (ref 8.9–10.3)
Chloride: 104 mmol/L (ref 98–111)
Creatinine, Ser: 1.24 mg/dL — ABNORMAL HIGH (ref 0.44–1.00)
GFR calc Af Amer: 49 mL/min — ABNORMAL LOW (ref >60)
GFR calc non Af Amer: 42 mL/min — ABNORMAL LOW (ref >60)
Glucose, Bld: 102 mg/dL — ABNORMAL HIGH (ref 70–99)
Potassium: 4.3 mmol/L (ref 3.5–5.1)
Sodium: 146 mmol/L — ABNORMAL HIGH (ref 135–145)

## 2018-11-29 LAB — TROPONIN I
TROPONIN I: 0.03 ng/mL — AB (ref <0.03)
Troponin I: 0.03 ng/mL (ref <0.03)

## 2018-11-29 LAB — PROTIME-INR
INR: 1.3 — AB (ref 0.8–1.2)
Prothrombin Time: 16.3 seconds — ABNORMAL HIGH (ref 11.4–15.2)

## 2018-11-29 MED ORDER — SODIUM CHLORIDE 0.9% FLUSH
3.0000 mL | Freq: Once | INTRAVENOUS | Status: AC
  Administered 2018-11-29: 3 mL via INTRAVENOUS

## 2018-11-29 MED ORDER — METOPROLOL SUCCINATE ER 50 MG PO TB24
50.0000 mg | ORAL_TABLET | Freq: Every day | ORAL | Status: DC
  Administered 2018-11-29: 50 mg via ORAL
  Filled 2018-11-29: qty 1

## 2018-11-29 NOTE — Discharge Instructions (Signed)
Patient is refusing to stay in the hospital.  I offered her admission but she refused.  I did give her her metoprolol for the day as her blood pressure was up here in the emergency room.

## 2018-11-29 NOTE — ED Notes (Signed)
Continue to await EMS transport.  PT sitting in chair at bedside with no c/o at this time.  Will continue to monitor.

## 2018-11-29 NOTE — ED Triage Notes (Signed)
Pt here via ems / from [facility] with report of chest pain .

## 2018-11-29 NOTE — ED Notes (Signed)
To room to check on pt who has slide out of end of bed despite bilateral rails up.  Pt standing at end of bed, pulling off monitors.  States she is leaving.  Explained to pt that staff was working on returning her home.  This RN spoke with daughter who states pt lives at [place].  She reports pt does not have diagnosis of dementia, but that she has noted some memory loss.  This RN spoke with care provider at [facility] and gave report, she states no transport.  Pt insists on sitting in chair and that she is going to leave and call 911 to pick her up if we don't get her home in 30 minutes.  Will check on pt frequently until EMS arrives.

## 2018-11-29 NOTE — ED Notes (Signed)
IVs removed, monitor d/c'd, pt changed into clean gown.  Pt sitting in chair at bedside with O2 in place waiting for EMS transport.  Pt aware of same, seems calmer at this time.  Pt agrees to wait in chair for EMS to arrive.  Will continue to monitor.

## 2018-11-29 NOTE — ED Notes (Signed)
Continue to await EMS transport.  Pt aware of same, voices no c/o at this time.  Will continue to monitor.

## 2018-11-29 NOTE — ED Provider Notes (Addendum)
[facility] Emergency Department Provider Note   ____________________________________________   First MD Initiated Contact with Patient 11/29/18 [phone]     (approximate)  I have reviewed the triage vital signs and the nursing notes.   HISTORY  Chief Complaint Chest Pain   HPI Danielle Munoz is a 78 y.o. female patient states she thinks she had sharp chest pain earlier today did not seem to last very long.  Few minutes or less she thinks which she does not remember for sure.  She feels fine now and wants to go home.  She will wait for me to get a second troponin. Patient says her breathing is pretty good for her right now.  Her legs are not as swollen as usual.  They are as red as usual.  Past Medical History:  Diagnosis Date  . Atrial flutter (HCC)    a. s/p TEE/DCCV 10/18 @ [facility] complicated by asystole and junctional rhythm requiring epi, atropine, and CPR x < 1 minute; b. amio and Eliquis; c. CHADS2VASC => 6 (CHF, HTN, age x 2, vascular disease, female)  . Chronic combined systolic and diastolic CHF (congestive heart failure) (HCC)   . CKD (chronic kidney disease), stage II   . COPD (chronic obstructive pulmonary disease) (HCC)   . Hypertension   . Hypothyroidism   . Iron deficiency anemia   . Morbid obesity (HCC)   . Noncompliance     Patient Active Problem List   Diagnosis Date Noted  . Palliative care by specialist   . Goals of care, counseling/discussion   . Acute respiratory failure (HCC) 10/16/2018  . Bradycardia 10/06/2018  . Sepsis (HCC) 09/12/2018  . Acute on chronic diastolic heart failure (HCC)   . Acute on chronic systolic CHF (congestive heart failure) (HCC) 11/07/2017  . Hypothyroidism 11/07/2017  . HTN (hypertension) 11/07/2017  . COPD (chronic obstructive pulmonary disease) (HCC) 11/07/2017    Past Surgical History:  Procedure Laterality Date  . HERNIA REPAIR    . PACEMAKER IMPLANT N/A 10/09/2018   Procedure: INSERTION  PACEMAKER-SINGLE CHAMBER;  Surgeon: Marcina MillardParaschos, Alexander, MD;  Location: [facility] ORS;  Service: Cardiovascular;  Laterality: N/A;    Prior to Admission medications   Medication Sig Start Date End Date Taking? Authorizing Provider  acetaminophen (TYLENOL) 325 MG tablet Take 650 mg by mouth every 6 (six) hours as needed for mild pain, moderate pain or fever.   Yes [provider]  albuterol (PROVENTIL HFA;VENTOLIN HFA) 108 (90 Base) MCG/ACT inhaler Inhale 2 puffs into the lungs every 6 (six) hours as needed for wheezing or shortness of breath.   Yes [provider]  apixaban (ELIQUIS) 5 MG TABS tablet Take 5 mg by mouth 2 (two) times daily. 07/16/17  Yes [provider]  atorvastatin (LIPITOR) 40 MG tablet Take 40 mg by mouth at bedtime.    Yes [provider]  Carboxymethylcellulose Sod PF (REFRESH PLUS) 0.5 % SOLN Place 1 drop into both eyes 3 (three) times daily as needed (dryness).   Yes [provider]  clotrimazole (LOTRIMIN) 1 % cream Apply 1 application topically 2 (two) times daily.   Yes [provider]  docusate sodium (COLACE) 100 MG capsule Take 200 mg by mouth daily.   Yes [provider]  ferrous sulfate 325 (65 FE) MG tablet Take 325 mg by mouth daily with breakfast.   Yes [provider]  guaifenesin (ROBITUSSIN) 100 MG/5ML syrup Take 100 mg by mouth every 4 (four) hours as  needed for cough.   Yes [provider]  levothyroxine (SYNTHROID, LEVOTHROID) 100 MCG tablet Take 100 mcg by mouth daily before breakfast.   Yes [provider]  LORazepam (ATIVAN) 0.5 MG tablet Take 0.5 tablets (0.25 mg total) by mouth 2 (two) times daily as needed for anxiety (agitation). 10/10/18  Yes Wieting, Richard, MD  Melatonin 3 MG TABS Take 3 mg by mouth at bedtime as needed (sleep).   Yes [provider]  metoprolol succinate (TOPROL-XL) 50 MG 24 hr tablet Take 1 tablet (50 mg total) by mouth daily. Take with  or immediately following a meal. 10/10/18  Yes Wieting, Richard, MD  polyethylene glycol (MIRALAX / GLYCOLAX) packet Take 17 g by mouth daily as needed for mild constipation or moderate constipation.   Yes [provider]  senna-docusate (SENOKOT-S) 8.6-50 MG tablet Take 2 tablets by mouth 2 (two) times daily as needed for mild constipation or moderate constipation.   Yes [provider]  Tiotropium Bromide-Olodaterol (STIOLTO RESPIMAT) 2.5-2.5 MCG/ACT AERS Inhale 2 puffs into the lungs daily.   Yes [provider]  torsemide (DEMADEX) 20 MG tablet Take 40-60 mg by mouth 2 (two) times daily.    Yes [provider]  risperiDONE (RISPERDAL) 0.5 MG tablet Take 1 tablet (0.5 mg total) by mouth at bedtime. Patient not taking: Reported on 11/29/2018 10/22/18   MayoAllyn Kenner, MD    Allergies Patient has no known allergies.  Family History  Problem Relation Age of Onset  . Diabetes Brother   . CAD Mother   . CAD Father   . Breast cancer Sister     Social History Social History   Tobacco Use  . Smoking status: Former Smoker    Types: Cigarettes  . Smokeless tobacco: Never Used  Substance Use Topics  . Alcohol use: No    Frequency: Never  . Drug use: No    Review of Systems  Constitutional: No fever/chills Eyes: No visual changes. ENT: No sore throat. Cardiovascular: Denies chest pain at present. Respiratory: Denies shortness of breath. Gastrointestinal: No abdominal pain.  No nausea, no vomiting.  No diarrhea.  No constipation. Genitourinary: Negative for dysuria. Musculoskeletal: Negative for back pain. Skin: Negative for rash. Neurological: Negative for headaches, focal weakness  ____________________________________________   PHYSICAL EXAM:  VITAL SIGNS: ED Triage Vitals  Enc Vitals Group     BP 11/29/18 0523 (!) 172/69     Pulse Rate 11/29/18 0523 62     Resp 11/29/18 0523 19     Temp 11/29/18 0523 98.2 F (36.8 C)     Temp  Source 11/29/18 0523 Oral     SpO2 11/29/18 0523 100 %     Weight 11/29/18 0508 231 lb 7.7 oz (105 kg)     Height 11/29/18 0508 5\' 5"  (1.651 m)     Head Circumference --      Peak Flow --      Pain Score --      Pain Loc --      Pain Edu? --      Excl. in GC? --     Constitutional: Alert and oriented. Well appearing and in no acute distress.  Was sleeping comfortably when I walked in the room with good sats on her usual 2 L of oxygen Eyes: Conjunctivae are normal.  Head: Atraumatic. Nose: No congestion/rhinnorhea. Mouth/Throat: Mucous membranes are moist.  Oropharynx non-erythematous. Neck: No stridor. Cardiovascular: Normal rate, regular rhythm. Grossly normal heart sounds.  Good  peripheral circulation. Respiratory: Normal respiratory effort.  No retractions. Lungs CTAB. Gastrointestinal: Soft and nontender. No distention. No abdominal bruits. No CVA tenderness. Musculoskeletal: No lower extremity tenderness red around the ankles with patient reports this is normal there is some edema present there is also some wrinkling it looks like the edema is less than usual. Neurologic:  Normal speech and language. No gross focal neurologic deficits are appreciated.  Skin:  Skin is warm, dry and intact. No rash noted. Psychiatric: Mood and affect are normal. Speech and behavior are normal.  ____________________________________________   LABS (all labs ordered are listed, but only abnormal results are displayed)  Labs Reviewed  BASIC METABOLIC PANEL - Abnormal; Notable for the following components:      Result Value   Sodium 146 (*)    CO2 34 (*)    Glucose, Bld 102 (*)    BUN 27 (*)    Creatinine, Ser 1.24 (*)    GFR calc non Af Amer 42 (*)    GFR calc Af Amer 49 (*)    All other components within normal limits  CBC - Abnormal; Notable for the following components:   WBC 11.5 (*)    Hemoglobin 11.8 (*)    MCH 24.3 (*)    MCHC 29.5 (*)    RDW 25.2 (*)    All other components  within normal limits  TROPONIN I - Abnormal; Notable for the following components:   Troponin I 0.03 (*)    All other components within normal limits  PROTIME-INR - Abnormal; Notable for the following components:   Prothrombin Time 16.3 (*)    INR 1.3 (*)    All other components within normal limits  TROPONIN I - Abnormal; Notable for the following components:   Troponin I 0.03 (*)    All other components within normal limits   ____________________________________________  EKG  EKG read interpreted by me very irregular baseline makes it difficult to interpret there intermittently paced beats the un-paced beats show what appears to be ST segment depression inferiorly and laterally.  This is new from prior EKGs repeat EKG done later also shows this but not quite as severe.   ____________________________________________  RADIOLOGY  ED MD interpretation: X-ray read by radiology reviewed by me does show left pleural effusion lingular opacity and cardiomegaly with vascular congestion as read.  Official radiology report(s): Dg Chest 2 View  Result Date: 11/29/2018 CLINICAL DATA:  78 year old female with chest pain. EXAM: CHEST - 2 VIEW COMPARISON:  Chest radiograph dated 10/21/2018 FINDINGS: Left pleural effusion with an area of opacity in the lingula similar to prior radiograph. Overall the appearance of the lungs is similar to prior radiograph. No pneumothorax. There is cardiomegaly with vascular congestion. Left pectoral pacemaker device. No acute osseous pathology. IMPRESSION: 1. Left pleural effusion and lingular opacity similar to prior radiograph. 2. Cardiomegaly with vascular congestion. Electronically Signed   By: Elgie Collard M.D.   On: 11/29/2018 06:22    ____________________________________________   PROCEDURES  Procedure(s) performed (including Critical Care):  Procedures   ____________________________________________   INITIAL IMPRESSION / ASSESSMENT AND PLAN / ED  COURSE  Patient wants to go home.  She saying her heart is not bothering her.  EKG is somewhat different than before troponins are negative.  I called the hospitalist to see if we can get her in the hospitalist overnight.  Patient is refusing to stay.  Nurse discussed this with her daughter.  Daughter says while the patient is at  half feels now.  Patient refuses to stay for the hospitalist refuses to stay for me she took all her leads off and says she is leaving in 30 minutes of renal get the ambulance here.  She is forgetful.  She does not remember that she saw me twice before.  But she does seem to be able to make decisions about whether or not she wants to stay in the hospital and she does seem to understand that her EKG is not normal and that this could be her heart.  I will let her go if she will not cooperate.  She does not have a diagnosis of dementia.  [facility] is aware of what is going on too.  Nurse called them.          ____________________________________________   FINAL CLINICAL IMPRESSION(S) / ED DIAGNOSES  Final diagnoses:  Chest pain, unspecified type     ED Discharge Orders    None       Note:  This document was prepared using Dragon voice recognition software and may include unintentional dictation errors.    Arnaldo Natal, MD 11/29/18 1554    Arnaldo Natal, MD 11/29/18 [id number]

## 2018-11-29 NOTE — ED Notes (Addendum)
PT has called out of room and on call bell several times asking when she is going to see a doctor.  Pt informed that she has seen a doctor and that she is awaiting test.  Pt does not remember seeing physician and is not easily reoriented.  Pt oriented to person and place, but not to situations.  Pt states she just wants to go home "without seeing a doctor".  Pt has denied CP since arrival to ED.

## 2018-12-02 NOTE — Progress Notes (Signed)
[name], female    DOB: 07/09/1941, 78 y.o.   MRN: [id number]  HPI  Danielle Munoz is a 78 y/o female with a history of HTN, CKD, thyroid disease, COPD, atrial fibrillation, anemia, previous tobacco use and chronic heart failure.   Echo report from 10/07/2018 reviewed and showed an EF of 50-55% along with mild MR, moderate TR and a PA pressure of 42 mm Hg.   Was in the ED 11/29/2018 due to chest pain where she was treated and released. Admitted 10/16/2018 due to COPD/HF exacerbation and pneumonia. Cardiology and palliative care consults obtained. Had to be intubated and then on bipap for short period after extubation. Given antibiotics. Initially needed IV lasix and then transitioned to oral diuretics. Elevated troponin thought to be due to demand ischemia. Discharged after 6 days.   She presents today for her initial visit with a chief complaint of moderate shortness of breath upon minimal exertion. She describes this as chronic in nature but is unable to state how long it's been going on. She has associated fatigue, pedal edema and light-headedness along with this. She denies any difficulty sleeping, abdominal distention, palpitations, chest pain or weight gain. Facility weight chart reviewed and she's lost 6 pounds in the last 5 days. Did take an increased dose of torsemide for a few days. Patient did not want to be weighed.   Past Medical History:  Diagnosis Date  . Atrial flutter (HCC)    a. s/p TEE/DCCV 10/18 @ [facility] complicated by asystole and junctional rhythm requiring epi, atropine, and CPR x < 1 minute; b. amio and Eliquis; c. CHADS2VASC => 6 (CHF, HTN, age x 2, vascular disease, female)  . Chronic combined systolic and diastolic CHF (congestive heart failure) (HCC)   . CKD (chronic kidney disease), stage II   . COPD (chronic obstructive pulmonary disease) (HCC)   . Hypertension   . Hypothyroidism   . Iron deficiency anemia   . Morbid obesity (HCC)   . Noncompliance     Past Surgical History:  Procedure Laterality Date  . HERNIA REPAIR    . PACEMAKER IMPLANT N/A 10/09/2018   Procedure: INSERTION PACEMAKER-SINGLE CHAMBER;  Surgeon: Marcina Millard, MD;  Location: [facility] ORS;  Service: Cardiovascular;  Laterality: N/A;   Family History  Problem Relation Age of Onset  . Diabetes Brother   . CAD Mother   . CAD Father   . Breast cancer Sister    Social History   Tobacco Use  . Smoking status: Former Smoker    Types: Cigarettes  . Smokeless tobacco: Never Used  Substance Use Topics  . Alcohol use: No    Frequency: Never   No Known Allergies Prior to Admission medications   Medication Sig Start Date End Date Taking? Authorizing Provider  acetaminophen (TYLENOL) 325 MG tablet Take 650 mg by mouth every 6 (six) hours as needed for mild pain, moderate pain or fever.   Yes [provider]  albuterol (PROVENTIL HFA;VENTOLIN HFA) 108 (90 Base) MCG/ACT inhaler Inhale 2 puffs into the lungs every 6 (six) hours as needed for wheezing or shortness of breath.   Yes [provider]  apixaban (ELIQUIS) 5 MG TABS tablet Take 5 mg by mouth 2 (two) times daily. 07/16/17  Yes [provider]  atorvastatin (LIPITOR) 40 MG tablet Take 40 mg by mouth at bedtime.    Yes [provider]  Carboxymethylcellulose Sod PF (REFRESH PLUS) 0.5 % SOLN Place 1 drop into both eyes  3 (three) times daily as needed (dryness).   Yes [provider]  docusate sodium (COLACE) 100 MG capsule Take 200 mg by mouth daily.   Yes [provider]  ferrous sulfate 325 (65 FE) MG tablet Take 325 mg by mouth daily with breakfast.   Yes [provider]  guaifenesin (ROBITUSSIN) 100 MG/5ML syrup Take 100 mg by mouth every 4 (four) hours as needed for cough.   Yes [provider]  levothyroxine (SYNTHROID, LEVOTHROID) 100 MCG tablet Take 100 mcg by mouth daily before breakfast.   Yes [provider]  LORazepam (ATIVAN)  0.5 MG tablet Take 0.5 tablets (0.25 mg total) by mouth 2 (two) times daily as needed for anxiety (agitation). 10/10/18  Yes Wieting, Richard, MD  Melatonin 3 MG TABS Take 3 mg by mouth at bedtime as needed (sleep).   Yes [provider]  metoprolol tartrate (LOPRESSOR) 50 MG tablet Take 50 mg by mouth 2 (two) times daily.   Yes [provider]  polyethylene glycol (MIRALAX / GLYCOLAX) packet Take 17 g by mouth daily as needed for mild constipation or moderate constipation.   Yes [provider]  senna-docusate (SENOKOT-S) 8.6-50 MG tablet Take 2 tablets by mouth 2 (two) times daily as needed for mild constipation or moderate constipation.   Yes [provider]  Tiotropium Bromide-Olodaterol (STIOLTO RESPIMAT) 2.5-2.5 MCG/ACT AERS Inhale 2 puffs into the lungs daily.   Yes [provider]  torsemide (DEMADEX) 20 MG tablet Take 40-60 mg by mouth 2 (two) times daily.    Yes [provider]    Review of Systems  Constitutional: Positive for fatigue. Negative for appetite change.  HENT: Positive for hearing loss. Negative for congestion, postnasal drip and sore throat.   Eyes: Negative.   Respiratory: Positive for shortness of breath. Negative for chest tightness.   Cardiovascular: Positive for leg swelling. Negative for chest pain and palpitations.  Gastrointestinal: Negative for abdominal distention and abdominal pain.  Endocrine: Negative.   Genitourinary: Negative.   Musculoskeletal: Negative for back pain and neck pain.  Skin: Negative.   Allergic/Immunologic: Negative.   Neurological: Positive for light-headedness (on occasion). Negative for dizziness.  Hematological: Negative for adenopathy. Does not bruise/bleed easily.  Psychiatric/Behavioral: Negative for dysphoric mood and sleep disturbance (sleeping on 1 pillow with oxygen at 2-3L). The patient is not nervous/anxious.     Vitals:   12/04/18 1323  BP: (!) 148/57  Pulse: (!) 43   Resp: 18  SpO2: 97%   Wt Readings from Last 3 Encounters:  11/29/18 231 lb 7.7 oz (105 kg)  11/05/18 228 lb 9.6 oz (103.7 kg)  10/16/18 242 lb 1 oz (109.8 kg)   Lab Results  Component Value Date   CREATININE 1.24 (H) 11/29/2018   CREATININE 0.92 10/22/2018   CREATININE 1.06 (H) 10/21/2018    Physical Exam Vitals signs and nursing note reviewed.  Constitutional:      Appearance: Normal appearance.  HENT:     Head: Normocephalic and atraumatic.     Right Ear: Decreased hearing noted.     Left Ear: Decreased hearing noted.  Neck:     Musculoskeletal: Normal range of motion and neck supple.  Cardiovascular:     Rate and Rhythm: Bradycardia present. Rhythm irregular.  Pulmonary:     Effort: Pulmonary effort is normal. No respiratory distress.     Breath sounds: No wheezing or rales.  Abdominal:     General: There is no distension.  Palpations: Abdomen is soft.  Musculoskeletal:        General: No tenderness.     Right lower leg: Edema (3+ pitting) present.     Left lower leg: Edema (3+ pitting) present.  Skin:    General: Skin is warm and dry.  Neurological:     General: No focal deficit present.     Mental Status: She is alert. Mental status is at baseline.     Comments: Confused about where she's living at  Psychiatric:        Mood and Affect: Mood normal.        Behavior: Behavior normal.    Assessment & Plan:  1: Chronic heart failure with preserved ejection fraction- - NYHA class III - euvolemic today - being weighed daily with order in place to call for an overnight weight gain of >2 pounds or a weekly weight gain of >5 pounds - not adding salt to her food and doesn't think the facility food (@ [facility]) tastes salty - saw cardiology ([facility]) 11/19/2018 - wearing oxygen at bedtime at 2-3L [place] - BNP 10/19/2018 was 1313.0 - PharmD reconciled medications  - transportation person says that hospice came in today to evaluate patient but she doesn't know  the outcome of that meeting.   2: HTN- - BP mildly elevated today - seeing PCP that is over [facility] - BMP from 11/29/2018 reviewed and showed sodium 146, potassium 4.3, creatinine 1.24 and GFR 42  3: Atrial fibrillation- - single-chamber pacemaker placed 10/09/2018 - on apixaban  4: Lymphedema- - stage 2 - elevating her legs "some" and she was encouraged to elevate them when sitting for long periods of time - she isn't sure she's wearing TED hose so an order was written for them to be put on every morning with removal at bedtime - limited in her ability to exercise due to her shortness of breath - could consider lymphapress compression boots if edema persists and if the facility can put them on  Facility medication list was reviewed.   Return in 2 months or sooner for any questions/problems before then.

## 2018-12-04 ENCOUNTER — Encounter: Payer: Self-pay | Admitting: Family

## 2018-12-04 ENCOUNTER — Ambulatory Visit: Payer: Medicare Other | Attending: Family | Admitting: Family

## 2018-12-04 ENCOUNTER — Encounter: Payer: Self-pay | Admitting: Pharmacist

## 2018-12-04 VITALS — BP 148/57 | HR 43 | Resp 18

## 2018-12-04 DIAGNOSIS — I10 Essential (primary) hypertension: Secondary | ICD-10-CM

## 2018-12-04 DIAGNOSIS — Z79890 Hormone replacement therapy (postmenopausal): Secondary | ICD-10-CM | POA: Diagnosis not present

## 2018-12-04 DIAGNOSIS — I4892 Unspecified atrial flutter: Secondary | ICD-10-CM | POA: Diagnosis not present

## 2018-12-04 DIAGNOSIS — Z87891 Personal history of nicotine dependence: Secondary | ICD-10-CM | POA: Insufficient documentation

## 2018-12-04 DIAGNOSIS — Z950 Presence of cardiac pacemaker: Secondary | ICD-10-CM | POA: Insufficient documentation

## 2018-12-04 DIAGNOSIS — I4891 Unspecified atrial fibrillation: Secondary | ICD-10-CM | POA: Diagnosis not present

## 2018-12-04 DIAGNOSIS — J449 Chronic obstructive pulmonary disease, unspecified: Secondary | ICD-10-CM | POA: Insufficient documentation

## 2018-12-04 DIAGNOSIS — E039 Hypothyroidism, unspecified: Secondary | ICD-10-CM | POA: Insufficient documentation

## 2018-12-04 DIAGNOSIS — Z833 Family history of diabetes mellitus: Secondary | ICD-10-CM | POA: Diagnosis not present

## 2018-12-04 DIAGNOSIS — Z7901 Long term (current) use of anticoagulants: Secondary | ICD-10-CM | POA: Insufficient documentation

## 2018-12-04 DIAGNOSIS — I482 Chronic atrial fibrillation, unspecified: Secondary | ICD-10-CM | POA: Insufficient documentation

## 2018-12-04 DIAGNOSIS — N182 Chronic kidney disease, stage 2 (mild): Secondary | ICD-10-CM | POA: Diagnosis not present

## 2018-12-04 DIAGNOSIS — I890 Lymphedema, not elsewhere classified: Secondary | ICD-10-CM | POA: Diagnosis not present

## 2018-12-04 DIAGNOSIS — I5042 Chronic combined systolic (congestive) and diastolic (congestive) heart failure: Secondary | ICD-10-CM | POA: Diagnosis not present

## 2018-12-04 DIAGNOSIS — Z8249 Family history of ischemic heart disease and other diseases of the circulatory system: Secondary | ICD-10-CM | POA: Insufficient documentation

## 2018-12-04 DIAGNOSIS — I130 Hypertensive heart and chronic kidney disease with heart failure and stage 1 through stage 4 chronic kidney disease, or unspecified chronic kidney disease: Secondary | ICD-10-CM | POA: Diagnosis present

## 2018-12-04 DIAGNOSIS — I5032 Chronic diastolic (congestive) heart failure: Secondary | ICD-10-CM

## 2018-12-04 DIAGNOSIS — Z79899 Other long term (current) drug therapy: Secondary | ICD-10-CM | POA: Insufficient documentation

## 2018-12-04 DIAGNOSIS — Z803 Family history of malignant neoplasm of breast: Secondary | ICD-10-CM | POA: Diagnosis not present

## 2018-12-04 DIAGNOSIS — D509 Iron deficiency anemia, unspecified: Secondary | ICD-10-CM | POA: Insufficient documentation

## 2018-12-04 NOTE — Patient Instructions (Signed)
Continue weighing daily and call for an overnight weight gain of > 2 pounds or a weekly weight gain of >5 pounds. 

## 2018-12-04 NOTE — Progress Notes (Signed)
[facility] - HEART FAILURE CLINIC - PHARMACIST COUNSELING NOTE  ADHERENCE ASSESSMENT  Adherence strategy: facility administers medications   Do you ever forget to take your medication? Yes (1) No (0)  Do you ever skip doses due to side effects? Yes (1) No (0)  Do you have trouble affording your medicines? Yes (1) No (0)  Are you ever unable to pick up your medication due to transportation difficulties? Yes (1) No (0)  Do you ever stop taking your medications because you don't believe they are helping? Yes (1) No (0)  Total score _0______    Recommendations given to patient about increasing adherence: None. Facility administers medications  Guideline-Directed Medical Therapy/Evidence Based Medicine    ACE/ARB/ARNI: none   Beta Blocker: metoprolol tartrate 50 mg BID   Aldosterone Antagonist: none Diuretic: torsemide 40 mg BID    SUBJECTIVE   HPI: Here for initial visit to HF Clinic. She was recently seen in the ED but refused treatment. She denies medication concerns.  Past Medical History:  Diagnosis Date  . Atrial flutter (HCC)    a. s/p TEE/DCCV 10/18 @ [facility] complicated by asystole and junctional rhythm requiring epi, atropine, and CPR x < 1 minute; b. amio and Eliquis; c. CHADS2VASC => 6 (CHF, HTN, age x 2, vascular disease, female)  . Chronic combined systolic and diastolic CHF (congestive heart failure) (HCC)   . CKD (chronic kidney disease), stage II   . COPD (chronic obstructive pulmonary disease) (HCC)   . Hypertension   . Hypothyroidism   . Iron deficiency anemia   . Morbid obesity (HCC)   . Noncompliance         OBJECTIVE    Vital signs: HR 43, BP 148/57, weight (pounds) 231  ECHO: Date 10/07/18, EF 50-55%   BMP Latest Ref Rng & Units 11/29/2018 10/22/2018 10/21/2018  Glucose 70 - 99 mg/dL 960(A) 82 540(J)  BUN 8 - 23 mg/dL 81(X) 91(Y) 78(G)  Creatinine 0.44 - 1.00 mg/dL 9.56(O) 1.30 8.65(H)  Sodium 135 - 145  mmol/L 146(H) 144 145  Potassium 3.5 - 5.1 mmol/L 4.3 3.8 4.1  Chloride 98 - 111 mmol/L 104 101 101  CO2 22 - 32 mmol/L 34(H) 39(H) 38(H)  Calcium 8.9 - 10.3 mg/dL 8.9 8.4(O) 9.6(E)    ASSESSMENT 78 year old female with HFpEF. She was recently in the ED but refused treatment. She saw her Cardiologist last month. Per MAR from facility, patient currently on metoprolol tartrate and torsemide. Lisinopril marked out on MAR. BP elevated at today's visit. Patient denies concerns with her medications. She endorses occasional dizziness or light-headedness when she exerts herself, but this improves with rest. Her weight is recorded daily.   PLAN No adjustments today. Can consider future titration or addition of antihypertensive for BP control.    Time spent: 10 minutes   K , Pharm.D. 12/04/2018 1:40 PM    Current Outpatient Medications:  .  acetaminophen (TYLENOL) 325 MG tablet, Take 650 mg by mouth every 6 (six) hours as needed for mild pain, moderate pain or fever., Disp: , Rfl:  .  albuterol (PROVENTIL HFA;VENTOLIN HFA) 108 (90 Base) MCG/ACT inhaler, Inhale 2 puffs into the lungs every 6 (six) hours as needed for wheezing or shortness of breath., Disp: , Rfl:  .  apixaban (ELIQUIS) 5 MG TABS tablet, Take 5 mg by mouth 2 (two) times daily., Disp: , Rfl:  .  atorvastatin (LIPITOR) 40 MG tablet, Take 40 mg by mouth at bedtime. ,  Disp: , Rfl:  .  Carboxymethylcellulose Sod PF (REFRESH PLUS) 0.5 % SOLN, Place 1 drop into both eyes 3 (three) times daily as needed (dryness)., Disp: , Rfl:  .  docusate sodium (COLACE) 100 MG capsule, Take 200 mg by mouth daily., Disp: , Rfl:  .  ferrous sulfate 325 (65 FE) MG tablet, Take 325 mg by mouth daily with breakfast., Disp: , Rfl:  .  guaifenesin (ROBITUSSIN) 100 MG/5ML syrup, Take 100 mg by mouth every 4 (four) hours as needed for cough., Disp: , Rfl:  .  levothyroxine (SYNTHROID, LEVOTHROID) 100 MCG tablet, Take 100 mcg by mouth daily before  breakfast., Disp: , Rfl:  .  LORazepam (ATIVAN) 0.5 MG tablet, Take 0.5 tablets (0.25 mg total) by mouth 2 (two) times daily as needed for anxiety (agitation)., Disp: 30 tablet, Rfl: 0 .  Melatonin 3 MG TABS, Take 3 mg by mouth at bedtime as needed (sleep)., Disp: , Rfl:  .  metoprolol tartrate (LOPRESSOR) 50 MG tablet, Take 50 mg by mouth 2 (two) times daily., Disp: , Rfl:  .  polyethylene glycol (MIRALAX / GLYCOLAX) packet, Take 17 g by mouth daily as needed for mild constipation or moderate constipation., Disp: , Rfl:  .  senna-docusate (SENOKOT-S) 8.6-50 MG tablet, Take 2 tablets by mouth 2 (two) times daily as needed for mild constipation or moderate constipation., Disp: , Rfl:  .  Tiotropium Bromide-Olodaterol (STIOLTO RESPIMAT) 2.5-2.5 MCG/ACT AERS, Inhale 2 puffs into the lungs daily., Disp: , Rfl:  .  torsemide (DEMADEX) 20 MG tablet, Take 40-60 mg by mouth 2 (two) times daily. , Disp: , Rfl:    COUNSELING POINTS/CLINICAL PEARLS  Torsemide  Side effects may include excessive urination.  Tell patient to report symptoms of ototoxicity.  Instruct patient to report lightheadedness or syncope.  Warn patient to avoid use of nonprescription NSAID products without first discussing it with their healthcare provider.   DRUGS TO AVOID IN HEART FAILURE  Drug or Class Mechanism  Analgesics . NSAIDs . COX-2 inhibitors . Glucocorticoids  Sodium and water retention, increased systemic vascular resistance, decreased response to diuretics   Diabetes Medications . Metformin . Thiazolidinediones o Rosiglitazone (Avandia) o Pioglitazone (Actos) . DPP4 Inhibitors o Saxagliptin (Onglyza) o Sitagliptin (Januvia)   Lactic acidosis Possible calcium channel blockade   Unknown  Antiarrhythmics . Class I  o Flecainide o Disopyramide . Class III o Sotalol . Other o Dronedarone  Negative inotrope, proarrhythmic   Proarrhythmic, beta blockade  Negative inotrope   Antihypertensives . Alpha Blockers o Doxazosin . Calcium Channel Blockers o Diltiazem o Verapamil o Nifedipine . Central Alpha Adrenergics o Moxonidine . Peripheral Vasodilators o Minoxidil  Increases renin and aldosterone  Negative inotrope    Possible sympathetic withdrawal  Unknown  Anti-infective . Itraconazole . Amphotericin B  Negative inotrope Unknown  Hematologic . Anagrelide . Cilostazol   Possible inhibition of PD IV Inhibition of PD III causing arrhythmias  Neurologic/Psychiatric . Stimulants . Anti-Seizure Drugs o Carbamazepine o Pregabalin . Antidepressants o Tricyclics o Citalopram . Parkinsons o Bromocriptine o Pergolide o Pramipexole . Antipsychotics o Clozapine . Antimigraine o Ergotamine o Methysergide . Appetite suppressants . Bipolar o Lithium  Peripheral alpha and beta agonist activity  Negative inotrope and chronotrope Calcium channel blockade  Negative inotrope, proarrhythmic Dose-dependent QT prolongation  Excessive serotonin activity/valvular damage Excessive serotonin activity/valvular damage Unknown  IgE mediated hypersensitivy, calcium channel blockade  Excessive serotonin activity/valvular damage Excessive serotonin activity/valvular damage Valvular damage  Direct myofibrillar degeneration, adrenergic  stimulation  Antimalarials . Chloroquine . Hydroxychloroquine Intracellular inhibition of lysosomal enzymes  Urologic Agents . Alpha Blockers o Doxazosin o Prazosin o Tamsulosin o Terazosin  Increased renin and aldosterone  Adapted from Page RL, et al. "Drugs That May Cause or Exacerbate Heart Failure: A Scientific Statement from the American Heart  Association." Circulation 2016; 134:e32-e69. DOI: 10.1161/CIR.0000000000000426   MEDICATION ADHERENCES TIPS AND STRATEGIES 1. Taking medication as prescribed improves patient outcomes in heart failure (reduces hospitalizations, improves symptoms, increases  survival) 2. Side effects of medications can be managed by decreasing doses, switching agents, stopping drugs, or adding additional therapy. Please let someone in the Heart Failure Clinic know if you have having bothersome side effects so we can modify your regimen. Do not alter your medication regimen without talking to [place].  3. Medication reminders can help patients remember to take drugs on time. If you are missing or forgetting doses you can try linking behaviors, using pill boxes, or an electronic reminder like an alarm on your phone or an app. Some people can also get automated phone calls as medication reminders.

## 2019-02-03 ENCOUNTER — Telehealth: Payer: Medicare Other | Admitting: Family

## 2019-02-27 ENCOUNTER — Encounter: Payer: Self-pay | Admitting: Cardiology

## 2020-01-27 ENCOUNTER — Other Ambulatory Visit: Payer: Self-pay

## 2020-01-27 ENCOUNTER — Non-Acute Institutional Stay: Payer: Medicare Other | Admitting: Primary Care

## 2020-01-27 DIAGNOSIS — Z515 Encounter for palliative care: Secondary | ICD-10-CM

## 2020-01-27 NOTE — Progress Notes (Signed)
[facility] Consult Note Telephone: [phone]  Fax: [phone]  PATIENT NAME: Danielle Munoz [address] [place] [address] [phone] (home)  DOB: 11-14-1940 MRN: [id number]  PRIMARY CARE PROVIDER:    Marisa Hua, MD,  [place] [address] [phone]  REFERRING PROVIDER:   Marisa Hua, MD [address] [place],  [place] [address] [phone]  RESPONSIBLE PARTY:   Extended Emergency Contact Information Primary Emergency Contact: Peggye Form Mobile Phone: [phone] Relation: Daughter  I met with patient at the  facility.  ASSESSMENT AND RECOMMENDATIONS:   1. Advance Care Planning/Goals of Care: Goals include to maximize quality of life and symptom management. I called POA Peggye Form. States she is eldest daughter and makes health care decisions. There is no official POA and there is another daughter. They make decisions together. DNR is noted on SNF record.  I reviewed the following:  . The value and importance of advance care planning  . Exploration of goals of care in the event of a sudden injury or illness  . Identification and preparation of a healthcare agent  Daughter states there is advance directive at SNF.  2. Symptom Management:   I visited with Mrs. Edrington for the first time today. She voices no complaints. States family visits her. Staff endorses short term memory loss.She states she does not need assistance with decisions that she is in charge. Staff states periodic anxiety, well treated with lorazepam. She is followed by psychiatry.  3. Family /Caregiver/Community Supports: Local family, able to visit. Pt is  resident of [facility].  4. Cognitive / Functional decline: W/c bound. Needs assistance with transfers. Cognitive impairment. She may have been d/c from [facility] in Dec 2020. Will f/u as appropriate for hospice re referral. Does no appear to meet criteria  at this time.  5. Follow up Palliative Care Visit: Palliative care will continue to follow for goals of care clarification and symptom management. Return 8 weeks or prn.  I spent 25 minutes providing this consultation,  from 1330 to 1355. More than 50% of the time in this consultation was spent coordinating communication.   HISTORY OF PRESENT ILLNESS:  Danielle Munoz is a 79 y.o. year old female with multiple medical problems including COPD, CKD, obesity, h/o a fib. Palliative Care was asked to follow this patient by consultation request of Marisa Hua, MD to help address advance care planning and goals of care. This is the initial visit.  CODE STATUS: DNR per SNF record PPS: 30% HOSPICE ELIGIBILITY/DIAGNOSIS: TBD  PAST MEDICAL HISTORY:  Past Medical History:  Diagnosis Date  . Atrial flutter ([place])    a. s/p TEE/DCCV 37/16 @ [facility] complicated by asystole and junctional rhythm requiring epi, atropine, and CPR x < 1 minute; b. amio and Eliquis; c. CHADS2VASC => 6 (CHF, HTN, age x 2, vascular disease, female)  . Chronic combined systolic and diastolic CHF (congestive heart failure) ([place])   . CKD (chronic kidney disease), stage II   . COPD (chronic obstructive pulmonary disease) ([place])   . Hypertension   . Hypothyroidism   . Iron deficiency anemia   . Morbid obesity ([place])   . Noncompliance     SOCIAL HX:  Social History   Tobacco Use  . Smoking status: Former Smoker    Types: Cigarettes  . Smokeless tobacco: Never Used  Substance Use Topics  . Alcohol use: No    ALLERGIES: No  Known Allergies   PERTINENT MEDICATIONS:  Outpatient Encounter Medications as of 78/28/2021  Medication Sig  . acetaminophen (TYLENOL) 325 MG tablet Take 650 mg by mouth every 6 (six) hours as needed for mild pain, moderate pain or fever.  Marland Kitchen albuterol (PROVENTIL HFA;VENTOLIN HFA) 108 (90 Base) MCG/ACT inhaler Inhale 2 puffs into the lungs every 6 (six) hours as needed for wheezing or shortness of  breath.  . docusate sodium (COLACE) 100 MG capsule Take 100 mg by mouth daily.   Marland Kitchen guaifenesin (ROBITUSSIN) 100 MG/5ML syrup Take 100 mg by mouth every 4 (four) hours as needed for cough.  . levothyroxine (SYNTHROID, LEVOTHROID) 100 MCG tablet Take 100 mcg by mouth daily before breakfast.  . LORazepam (ATIVAN) 1 MG tablet Take 1 mg by mouth 3 (three) times daily.  . metoprolol tartrate (LOPRESSOR) 50 MG tablet Take 50 mg by mouth 2 (two) times daily.  Marland Kitchen nystatin (MYCOSTATIN/NYSTOP) powder Apply 1 application topically daily as needed. To affected areas.  . torsemide (DEMADEX) 20 MG tablet Take 80 mg by mouth 2 (two) times daily.   . [DISCONTINUED] apixaban (ELIQUIS) 5 MG TABS tablet Take 5 mg by mouth 2 (two) times daily.  . [DISCONTINUED] atorvastatin (LIPITOR) 40 MG tablet Take 40 mg by mouth at bedtime.   . [DISCONTINUED] Carboxymethylcellulose Sod PF (REFRESH PLUS) 0.5 % SOLN Place 1 drop into both eyes 3 (three) times daily as needed (dryness).  . [DISCONTINUED] ferrous sulfate 325 (65 FE) MG tablet Take 325 mg by mouth daily with breakfast.  . [DISCONTINUED] LORazepam (ATIVAN) 0.5 MG tablet Take 0.5 tablets (0.25 mg total) by mouth 2 (two) times daily as needed for anxiety (agitation).  . [DISCONTINUED] Melatonin 3 MG TABS Take 3 mg by mouth at bedtime as needed (sleep).  . [DISCONTINUED] polyethylene glycol (MIRALAX / GLYCOLAX) packet Take 17 g by mouth daily as needed for mild constipation or moderate constipation.  . [DISCONTINUED] senna-docusate (SENOKOT-S) 8.6-50 MG tablet Take 2 tablets by mouth 2 (two) times daily as needed for mild constipation or moderate constipation.  . [DISCONTINUED] Tiotropium Bromide-Olodaterol (STIOLTO RESPIMAT) 2.5-2.5 MCG/ACT AERS Inhale 2 puffs into the lungs daily.   No facility-administered encounter medications on file as of 01/27/2020.    PHYSICAL EXAM / ROS:   Current and past weights: 219 lbs. General: NAD, frail appearing, obese Cardiovascular:  no chest pain reported, no edema  Pulmonary: no cough, no increased SOB, room air Abdomen: appetite good, 75-100%, denies constipation, incontinent of bowel GU: denies dysuria, incontinent of urine MSK:  + joint and ROM abnormalities, non ambulatory Skin: no rashes or wounds reported Neurological: Weakness, cognitive impairment, h/o anxiety  Jason Coop, NP [facility]  COVID-19 PATIENT SCREENING TOOL  Person answering questions: ____________Staff_______ _____   1.  Is the patient or any family member in the home showing any signs or symptoms regarding respiratory infection?               Person with Symptom- __________NA_________________  a. Fever                                                                          Yes___ No___          ___________________  b. Shortness of breath                                                    Yes___ No___          ___________________ c. Cough/congestion                                       Yes___  No___         ___________________ d. Body aches/pains                                                         Yes___ No___        ____________________ e. Gastrointestinal symptoms (diarrhea, nausea)           Yes___ No___        ____________________  2. Within the past 14 days, has anyone living in the home had any contact with someone with or under investigation for COVID-19?    Yes___ No_X_   Person __________________

## 2020-02-19 ENCOUNTER — Other Ambulatory Visit: Payer: Self-pay

## 2020-02-19 ENCOUNTER — Non-Acute Institutional Stay: Payer: Medicare Other | Admitting: Primary Care

## 2020-02-19 DIAGNOSIS — Z515 Encounter for palliative care: Secondary | ICD-10-CM

## 2020-02-19 DIAGNOSIS — J449 Chronic obstructive pulmonary disease, unspecified: Secondary | ICD-10-CM

## 2020-02-19 NOTE — Progress Notes (Signed)
[facility] Consult Note Telephone: [phone]  Fax: [phone]  PATIENT NAME: Danielle Munoz [address] [place] [address] [phone] (home)  DOB: 1941/05/03 MRN: [id number]  PRIMARY CARE PROVIDER:    Marisa Hua, MD,  Danielle Munoz [address] [phone]  REFERRING PROVIDER:   Marisa Hua, MD [address] [place],  [place] [address] [phone]  RESPONSIBLE PARTY:   Extended Emergency Contact Information Primary Emergency Contact: Danielle Munoz Mobile Phone: [phone] Relation: Daughter  I met with patient in the facility.  ASSESSMENT AND RECOMMENDATIONS:   1. Advance Care Planning/Goals of Care: Goals include to maximize quality of life and symptom management. DNR noted on face sheet but not in acp chart. Informed staff. I will complete and send to SNF. Uploaded to [facility]  2. Symptom Management:   Patient in her room, resting but awoke and interacted. Denies discomfort. States she could not find call bell. This was placed in her reach. Staff reports some refusal on occasion to wear depends for incontinence. Currently Rx candidiasis in groin area.   3. Family /Caregiver/Community Supports: T/c to Danielle Munoz, daughter. No concerns presently. Pt lives in [place].  4. Cognitive / Functional decline: At baseline, can walk with walker on occasion. Needs help with most adls.  5. Follow up Palliative Care Visit: Palliative care will continue to follow for goals of care clarification and symptom management. Return 8 weeks or prn.  I spent 25 minutes providing this consultation,  from 0930 to 0955. More than 50% of the time in this consultation was spent coordinating communication.   HISTORY OF PRESENT ILLNESS:  Danielle Munoz is a 79 y.o. year old female with multiple medical problems including cognitive impairment, COPD, a fib, obesity. Palliative Care was asked to follow this patient by  consultation request of Danielle Hua, MD to help address advance care planning and goals of care. This is a follow up visit.  CODE STATUS: DNR   PPS: 30% HOSPICE ELIGIBILITY/DIAGNOSIS: no  PAST MEDICAL HISTORY:  Past Medical History:  Diagnosis Date  . Atrial flutter ([place])    a. s/p TEE/DCCV 22/97 @ [facility] complicated by asystole and junctional rhythm requiring epi, atropine, and CPR x < 1 minute; b. amio and Eliquis; c. CHADS2VASC => 6 (CHF, HTN, age x 2, vascular disease, female)  . Chronic combined systolic and diastolic CHF (congestive heart failure) ([place])   . CKD (chronic kidney disease), stage II   . COPD (chronic obstructive pulmonary disease) ([place])   . Hypertension   . Hypothyroidism   . Iron deficiency anemia   . Morbid obesity ([place])   . Noncompliance     SOCIAL HX:  Social History   Tobacco Use  . Smoking status: Former Smoker    Types: Cigarettes  . Smokeless tobacco: Never Used  Substance Use Topics  . Alcohol use: No    ALLERGIES: No Known Allergies   PERTINENT MEDICATIONS:  Outpatient Encounter Medications as of 02/19/2020  Medication Sig  . acetaminophen (TYLENOL) 325 MG tablet Take 650 mg by mouth every 6 (six) hours as needed for mild pain, moderate pain or fever.  Marland Kitchen albuterol (PROVENTIL HFA;VENTOLIN HFA) 108 (90 Base) MCG/ACT inhaler Inhale 2 puffs into the lungs every 6 (six) hours as needed for wheezing or shortness of breath.  . docusate sodium (COLACE) 100 MG capsule Take 100 mg by mouth daily.   Marland Kitchen guaifenesin (ROBITUSSIN) 100 MG/5ML syrup Take  100 mg by mouth every 4 (four) hours as needed for cough.  Marland Kitchen ketoconazole (NIZORAL) 2 % cream Apply 1 application topically 2 (two) times daily. To left groin until healed.  Marland Kitchen levothyroxine (SYNTHROID, LEVOTHROID) 100 MCG tablet Take 100 mcg by mouth daily before breakfast.  . LORazepam (ATIVAN) 1 MG tablet Take 1 mg by mouth 3 (three) times daily.  . metoprolol tartrate (LOPRESSOR) 50 MG tablet Take 50 mg by  mouth 2 (two) times daily.  . nitroGLYCERIN (NITROSTAT) 0.3 MG SL tablet Place 0.3 mg under the tongue every 5 (five) minutes as needed for chest pain. If no relief call MD.  . nystatin (NYSTATIN) powder Apply 1 application topically 3 (three) times daily as needed. Under breasts prn itching  . torsemide (DEMADEX) 20 MG tablet Take 80 mg by mouth 2 (two) times daily.   . [DISCONTINUED] nystatin (MYCOSTATIN/NYSTOP) powder Apply 1 application topically daily as needed. To affected areas.   No facility-administered encounter medications on file as of 02/19/2020.    PHYSICAL EXAM / ROS:   Current and past weights: 211 lbs General: NAD, frail appearing, obese Cardiovascular: HR regular, no chest pain reported, no edema in LE Pulmonary: no cough, no increased SOB, room air, lungs CTA Abdomen: appetite good , denies constipation, incontinent of bowel at times GU: denies dysuria, incontinent of urine MSK:  + joint and ROM abnormalities, ambulatory with walker Skin: groin rash reported Neurological: Weakness, denies pain. Denies insomnia  Danielle Coop, NP [facility]  COVID-19 PATIENT SCREENING TOOL  Person answering questions: ____________Staff_______ _____   1.  Is the patient or any family member in the home showing any signs or symptoms regarding respiratory infection?               Person with Symptom- __________NA_________________  a. Fever                                                                          Yes___ No___          ___________________  b. Shortness of breath                                                    Yes___ No___          ___________________ c. Cough/congestion                                       Yes___  No___         ___________________ d. Body aches/pains                                                         Yes___ No___        ____________________ e. Gastrointestinal symptoms (diarrhea, nausea)  Yes___ No___         ____________________  2. Within the past 14 days, has anyone living in the home had any contact with someone with or under investigation for COVID-19?    Yes___ No_X_   Person __________________

## 2020-04-08 ENCOUNTER — Other Ambulatory Visit: Payer: Self-pay

## 2020-04-08 ENCOUNTER — Non-Acute Institutional Stay: Payer: Medicare Other | Admitting: Primary Care

## 2020-04-08 DIAGNOSIS — Z515 Encounter for palliative care: Secondary | ICD-10-CM

## 2020-04-08 DIAGNOSIS — J449 Chronic obstructive pulmonary disease, unspecified: Secondary | ICD-10-CM

## 2020-04-08 NOTE — Progress Notes (Signed)
[facility] Consult Note Telephone: [phone]  Fax: [phone]  PATIENT NAME: Danielle Munoz [address] [place] [address] [phone] (home)  DOB: 1941/07/30 MRN: [id number]  PRIMARY CARE PROVIDER:    Marisa Hua, MD,  [place] [address] [phone]  REFERRING PROVIDER:   Marisa Hua, MD [address] [place],  [place] [address] [phone]  RESPONSIBLE PARTY:   Extended Emergency Contact Information Primary Emergency Contact: Peggye Form Mobile Phone: [phone] Relation: Daughter  I met with patient in the facility.  ASSESSMENT AND RECOMMENDATIONS:   1. Advance Care Planning/Goals of Care: Goals include to maximize quality of life and symptom management. Advance directives reviewed on file, DNR on file at SNF. POA called, no answer, message left. Patient states goals are to feel better and be more mobile.  2. Symptom Management:   Mobility: States she does not like to ambulate on cue but wants to show me how she walks. She states she does not  Need a walker and attempts to get up from bed without assistance. I brought over the walker and cued her to use her hands to push up from the bed vs pulling up on the walker. She ambulated easily to the end of her hall and back.  Denies falls. I cautioned her to be careful on the floor and use skid proof socks and shoes. She denies chest pain after ambulation and also does not exhibit DOE.   Nutrition: Likes large amounts of water. States good appetite. Stable weights.  Heart/Angina: Has had some recent angina, relieved with NTG. She has irregular heart rate and rhythm. Denies angina  after walking. No recent TSH in [facility] chart, request review of TSH.    3. Family /Caregiver/Community Supports: States family does not visit much, lives in [place].  4. Cognitive / Functional decline:  A and On x2. States she wants to go home.Can do own pan  bath, feeding and some toileting, ambulates with stand by and walker.  5. Follow up Palliative Care Visit: Palliative care will continue to follow for goals of care clarification and symptom management. Return 4-6 weeks or prn.  I spent 35 minutes providing this consultation,  from 1000 to 1035. More than 50% of the time in this consultation was spent coordinating communication.   Chief Complaint: Recent chest pian, mobility needs assessment.  HISTORY OF PRESENT ILLNESS:  Danielle Munoz is a 79 y.o. year old female with multiple medical problems including immobility, fall risk, cognitive impairment, angina.  Palliative Care was asked to follow this patient by consultation request of Marisa Hua, MD to help address advance care planning and goals of care. This is a follow up visit.  CODE STATUS: DNR PPS:40%  HOSPICE ELIGIBILITY/DIAGNOSIS: TBD  PAST MEDICAL HISTORY:  Past Medical History:  Diagnosis Date   Atrial flutter ([facility])    a. s/p TEE/DCCV 81/77 @ [facility] complicated by asystole and junctional rhythm requiring epi, atropine, and CPR x < 1 minute; b. amio and Eliquis; c. CHADS2VASC => 6 (CHF, HTN, age x 2, vascular disease, female)   Chronic combined systolic and diastolic CHF (congestive heart failure) ([place])    CKD (chronic kidney disease), stage II    COPD (chronic obstructive pulmonary disease) ([place])    Hypertension    Hypothyroidism    Iron deficiency anemia    Morbid obesity ([place])    Noncompliance     SOCIAL HX:  Social  History   Tobacco Use   Smoking status: Former Smoker    Types: Cigarettes   Smokeless tobacco: Never Used  Substance Use Topics   Alcohol use: No    ALLERGIES: No Known Allergies   PERTINENT MEDICATIONS:  Outpatient Encounter Medications as of 04/08/2020  Medication Sig   acetaminophen (TYLENOL) 325 MG tablet Take 650 mg by mouth every 6 (six) hours as needed for mild pain, moderate pain or fever.   albuterol (PROVENTIL  HFA;VENTOLIN HFA) 108 (90 Base) MCG/ACT inhaler Inhale 2 puffs into the lungs every 6 (six) hours as needed for wheezing or shortness of breath.   docusate sodium (COLACE) 100 MG capsule Take 100 mg by mouth daily.    ketoconazole (NIZORAL) 2 % cream Apply 1 application topically 2 (two) times daily. To left groin until healed.   levothyroxine (SYNTHROID, LEVOTHROID) 100 MCG tablet Take 100 mcg by mouth daily before breakfast.   LORazepam (ATIVAN) 1 MG tablet Take 1 mg by mouth 3 (three) times daily.   metoprolol tartrate (LOPRESSOR) 50 MG tablet Take 50 mg by mouth 2 (two) times daily.   morphine 20 MG/5ML solution Take 2 mg by mouth every 2 (two) hours as needed for pain. Give 2 mg (0.5 ml) by mouth every 2 hours as needed for pain.   nitroGLYCERIN (NITROSTAT) 0.3 MG SL tablet Place 0.3 mg under the tongue every 5 (five) minutes as needed for chest pain. If no relief call MD.   nystatin (NYSTATIN) powder Apply 1 application topically 3 (three) times daily as needed. Under breasts prn itching   torsemide (DEMADEX) 20 MG tablet Take 80 mg by mouth 2 (two) times daily.    [DISCONTINUED] guaifenesin (ROBITUSSIN) 100 MG/5ML syrup Take 100 mg by mouth every 4 (four) hours as needed for cough.   No facility-administered encounter medications on file as of 04/08/2020.     PHYSICAL EXAM / ROS:   Current and past weights: 211 lbs, stable  General: NAD, frail appearing, obese Cardiovascular: Irreg rate and rhythm, rate 88 bmp, no chest pain reported,ede 1-2 + LE edema  Pulmonary: no cough, no increased SOB,  No DOE, room air, lungs CTA Abdomen: appetite good, drinks a lot of water, continent of bowel GU: denies dysuria, incontinent of urine  At times  MSK: ++  joint and ROM abnormalities, denies falls, ambulatory with  Walker and stand by Skin: no rashes or wounds reported Neurological: Weakness, memory loss  Jason Coop, NP [facility]  COVID-19 PATIENT SCREENING TOOL  Person  answering questions: ____________Staff_______ _____   1.  Is the patient or any family member in the home showing any signs or symptoms regarding respiratory infection?               Person with Symptom- __________NA_________________  a. Fever                                                                          Yes___ No___          ___________________  b. Shortness of breath  Yes___ No___          ___________________ °c. Cough/congestion                                       Yes___  No___         ___________________ °d. Body aches/pains                                                         Yes___ No___        ____________________ °e. Gastrointestinal symptoms (diarrhea, nausea)           Yes___ No___        ____________________ ° °2. Within the past 14 days, has anyone living in the home had any contact with someone with or under investigation for COVID-19?    °Yes___ No_X_   Person __________________ ° ° °

## 2020-04-18 ENCOUNTER — Encounter: Payer: Self-pay | Admitting: Emergency Medicine

## 2020-04-18 ENCOUNTER — Emergency Department: Payer: Medicare Other

## 2020-04-18 ENCOUNTER — Emergency Department
Admission: EM | Admit: 2020-04-18 | Discharge: 2020-04-18 | Disposition: A | Discharge status: Skilled Nursing Facility | Payer: Medicare Other | Attending: Emergency Medicine | Admitting: Emergency Medicine

## 2020-04-18 DIAGNOSIS — S0990XA Unspecified injury of head, initial encounter: Secondary | ICD-10-CM | POA: Diagnosis not present

## 2020-04-18 DIAGNOSIS — J449 Chronic obstructive pulmonary disease, unspecified: Secondary | ICD-10-CM | POA: Insufficient documentation

## 2020-04-18 DIAGNOSIS — R000 Tachycardia, unspecified: Secondary | ICD-10-CM | POA: Insufficient documentation

## 2020-04-18 DIAGNOSIS — W19XXXA Unspecified fall, initial encounter: Secondary | ICD-10-CM

## 2020-04-18 DIAGNOSIS — Y939 Activity, unspecified: Secondary | ICD-10-CM | POA: Insufficient documentation

## 2020-04-18 DIAGNOSIS — Y929 Unspecified place or not applicable: Secondary | ICD-10-CM | POA: Diagnosis not present

## 2020-04-18 DIAGNOSIS — J9811 Atelectasis: Secondary | ICD-10-CM | POA: Diagnosis not present

## 2020-04-18 DIAGNOSIS — Y999 Unspecified external cause status: Secondary | ICD-10-CM | POA: Diagnosis not present

## 2020-04-18 DIAGNOSIS — I517 Cardiomegaly: Secondary | ICD-10-CM | POA: Diagnosis not present

## 2020-04-18 DIAGNOSIS — E039 Hypothyroidism, unspecified: Secondary | ICD-10-CM | POA: Diagnosis not present

## 2020-04-18 DIAGNOSIS — Z950 Presence of cardiac pacemaker: Secondary | ICD-10-CM | POA: Insufficient documentation

## 2020-04-18 DIAGNOSIS — W1839XA Other fall on same level, initial encounter: Secondary | ICD-10-CM | POA: Insufficient documentation

## 2020-04-18 DIAGNOSIS — R451 Restlessness and agitation: Secondary | ICD-10-CM | POA: Diagnosis not present

## 2020-04-18 DIAGNOSIS — S0003XA Contusion of scalp, initial encounter: Secondary | ICD-10-CM | POA: Diagnosis present

## 2020-04-18 LAB — URINALYSIS, COMPLETE (UACMP) WITH MICROSCOPIC
Bacteria, UA: NONE SEEN
Bilirubin Urine: NEGATIVE
Glucose, UA: NEGATIVE mg/dL
Ketones, ur: NEGATIVE mg/dL
Leukocytes,Ua: NEGATIVE
Nitrite: NEGATIVE
Protein, ur: NEGATIVE mg/dL
Specific Gravity, Urine: 1.011 (ref 1.005–1.030)
pH: 6 (ref 5.0–8.0)

## 2020-04-18 LAB — CBC WITH DIFFERENTIAL/PLATELET
Abs Immature Granulocytes: 0.07 10*3/uL (ref 0.00–0.07)
Basophils Absolute: 0.1 10*3/uL (ref 0.0–0.1)
Basophils Relative: 0 %
Eosinophils Absolute: 0.1 10*3/uL (ref 0.0–0.5)
Eosinophils Relative: 1 %
HCT: 47.1 % — ABNORMAL HIGH (ref 36.0–46.0)
Hemoglobin: 14.9 g/dL (ref 12.0–15.0)
Immature Granulocytes: 1 %
Lymphocytes Relative: 8 %
Lymphs Abs: 1.1 10*3/uL (ref 0.7–4.0)
MCH: 27 pg (ref 26.0–34.0)
MCHC: 31.6 g/dL (ref 30.0–36.0)
MCV: 85.5 fL (ref 80.0–100.0)
Monocytes Absolute: 0.8 10*3/uL (ref 0.1–1.0)
Monocytes Relative: 6 %
Neutro Abs: 10.5 10*3/uL — ABNORMAL HIGH (ref 1.7–7.7)
Neutrophils Relative %: 84 %
Platelets: 256 10*3/uL (ref 150–400)
RBC: 5.51 MIL/uL — ABNORMAL HIGH (ref 3.87–5.11)
RDW: 15.7 % — ABNORMAL HIGH (ref 11.5–15.5)
WBC: 12.6 10*3/uL — ABNORMAL HIGH (ref 4.0–10.5)
nRBC: 0 % (ref 0.0–0.2)

## 2020-04-18 LAB — COMPREHENSIVE METABOLIC PANEL
ALT: 12 U/L (ref 0–44)
AST: 20 U/L (ref 15–41)
Albumin: 3.9 g/dL (ref 3.5–5.0)
Alkaline Phosphatase: 136 U/L — ABNORMAL HIGH (ref 38–126)
Anion gap: 11 (ref 5–15)
BUN: 30 mg/dL — ABNORMAL HIGH (ref 8–23)
CO2: 29 mmol/L (ref 22–32)
Calcium: 8.7 mg/dL — ABNORMAL LOW (ref 8.9–10.3)
Chloride: 100 mmol/L (ref 98–111)
Creatinine, Ser: 1.46 mg/dL — ABNORMAL HIGH (ref 0.44–1.00)
GFR calc Af Amer: 40 mL/min — ABNORMAL LOW (ref >60)
GFR calc non Af Amer: 34 mL/min — ABNORMAL LOW (ref >60)
Glucose, Bld: 96 mg/dL (ref 70–99)
Potassium: 3.9 mmol/L (ref 3.5–5.1)
Sodium: 140 mmol/L (ref 135–145)
Total Bilirubin: 1.1 mg/dL (ref 0.3–1.2)
Total Protein: 7.9 g/dL (ref 6.5–8.1)

## 2020-04-18 LAB — TROPONIN I (HIGH SENSITIVITY): Troponin I (High Sensitivity): 25 ng/L — ABNORMAL HIGH (ref <18)

## 2020-04-18 MED ORDER — LORAZEPAM 1 MG PO TABS
1.0000 mg | ORAL_TABLET | Freq: Once | ORAL | Status: AC
  Administered 2020-04-18: 1 mg via ORAL
  Filled 2020-04-18: qty 1

## 2020-04-18 MED ORDER — METOPROLOL TARTRATE 50 MG PO TABS
50.0000 mg | ORAL_TABLET | Freq: Once | ORAL | Status: AC
  Administered 2020-04-18: 50 mg via ORAL
  Filled 2020-04-18: qty 1

## 2020-04-18 NOTE — ED Provider Notes (Signed)
ER Provider Note       Time seen: 6:57 AM    I have reviewed the vital signs and the nursing notes.  HISTORY   Chief Complaint Fall    HPI Danielle Munoz is a 79 y.o. female with a history of atrial flutter, CHF, CKD, COPD, hypertension, hypothyroidism who presents today for an unwitnessed fall.  Patient is unsure what made her fall, presents with a hematoma to the scalp but denies any pain.  Past Medical History:  Diagnosis Date  . Atrial flutter ([facility])    a. s/p TEE/DCCV 10/18 @ [facility] complicated by asystole and junctional rhythm requiring epi, atropine, and CPR x < 1 minute; b. amio and Eliquis; c. CHADS2VASC => 6 (CHF, HTN, age x 2, vascular disease, female)  . Chronic combined systolic and diastolic CHF (congestive heart failure) ([facility])   . CKD (chronic kidney disease), stage II   . COPD (chronic obstructive pulmonary disease) ([facility])   . Hypertension   . Hypothyroidism   . Iron deficiency anemia   . Morbid obesity ([facility])   . Noncompliance     Past Surgical History:  Procedure Laterality Date  . HERNIA REPAIR    . PACEMAKER IMPLANT N/A 10/09/2018   Procedure: INSERTION PACEMAKER-SINGLE CHAMBER;  Surgeon: Marcina Millard, MD;  Location: [facility] ORS;  Service: Cardiovascular;  Laterality: N/A;    Allergies Patient has no known allergies.  Review of Systems Constitutional: Negative for fever. Cardiovascular: Negative for chest pain. Respiratory: Negative for shortness of breath. Gastrointestinal: Negative for abdominal pain, vomiting and diarrhea. Musculoskeletal: Negative for back pain. Skin: Positive for hematoma Neurological: Negative for headaches, focal weakness or numbness.  All systems negative/normal/unremarkable except as stated in the HPI  ____________________________________________   PHYSICAL EXAM:  VITAL SIGNS: Vitals:   04/18/20 0623  BP: (!) 159/105  Pulse: 94  Resp: 18  Temp: 98.3 F (36.8 C)  SpO2: 95%    Constitutional:  Well  appearing and in no distress. Eyes: Conjunctivae are normal. Normal extraocular movements. ENT      Head: Normocephalic, no obvious scalp hematoma noted      Nose: No congestion/rhinnorhea.      Mouth/Throat: Mucous membranes are moist.      Neck: No stridor. Cardiovascular: Rapid rate, regular rhythm. No murmurs, rubs, or gallops. Respiratory: Normal respiratory effort without tachypnea nor retractions. Breath sounds are clear and equal bilaterally. No wheezes/rales/rhonchi. Gastrointestinal: Soft and nontender. Normal bowel sounds Musculoskeletal: Nontender with normal range of motion in extremities. No lower extremity tenderness nor edema. Neurologic:  Normal speech and language. No gross focal neurologic deficits are appreciated.  Skin:  Skin is warm, dry and intact. No rash noted. Psychiatric: Speech and behavior are normal.  ____________________________________________  EKG: Interpreted by me.  Indeterminate rhythm, rate is 109 bpm, nonspecific ST segment changes, long QT  ____________________________________________   LABS (pertinent positives/negatives)  Labs Reviewed  CBC WITH DIFFERENTIAL/PLATELET - Abnormal; Notable for the following components:      Result Value   WBC 12.6 (*)    RBC 5.51 (*)    HCT 47.1 (*)    RDW 15.7 (*)    Neutro Abs 10.5 (*)    All other components within normal limits  COMPREHENSIVE METABOLIC PANEL - Abnormal; Notable for the following components:   BUN 30 (*)    Creatinine, Ser 1.46 (*)    Calcium 8.7 (*)    Alkaline Phosphatase 136 (*)    GFR calc non Af Amer 34 (*)  GFR calc Af Amer 40 (*)    All other components within normal limits  URINALYSIS, COMPLETE (UACMP) WITH MICROSCOPIC - Abnormal; Notable for the following components:   Color, Urine YELLOW (*)    APPearance HAZY (*)    Hgb urine dipstick SMALL (*)    All other components within normal limits  TROPONIN I (HIGH SENSITIVITY) - Abnormal; Notable for the following components:    Troponin I (High Sensitivity) 25 (*)    All other components within normal limits    RADIOLOGY  Images were viewed by me CT head IMPRESSION: 1. No evidence of acute intracranial abnormality. 2. 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 IMPRESSION: Cardiomegaly and pulmonary vascular congestion with left basilar atelectasis or consolidation and a possible left pleural effusion.   DIFFERENTIAL DIAGNOSIS  Fall, minor head injury, subdural, CVA, subarachnoid hemorrhage, fracture  ASSESSMENT AND PLAN  Fall, minor head injury   Plan: The patient had presented for a fall. Patient's labs were grossly at her baseline and unremarkable.  Patient denies any complaints and wants to go to her place of residence.  She was given her morning dose of metoprolol and Ativan because she seemed somewhat agitated and she was mildly tachycardic.  She is cleared for outpatient follow-up.  Daryel November MD    Note: This note was generated in part or whole with voice recognition software. Voice recognition is usually quite accurate but there are transcription errors that can and very often do occur. I apologize for any typographical errors that were not detected and corrected.     Emily Filbert, MD 04/18/20 [phone]

## 2020-04-18 NOTE — ED Notes (Signed)
Patient to CT scan

## 2020-04-18 NOTE — ED Notes (Signed)
Report given to [place] at [facility] [employer] [facility]

## 2020-04-18 NOTE — ED Notes (Signed)
Topez not working, Pt verbally understands discharge instructrions

## 2020-04-18 NOTE — ED Triage Notes (Signed)
Patient to rm 26 via EMS from local nsg home after an unwitnessed fall.  Patient unsure what made her fall.  Patient with hematoma to scalp but patient denies any pain.

## 2020-04-29 ENCOUNTER — Other Ambulatory Visit: Payer: Self-pay

## 2020-04-29 ENCOUNTER — Non-Acute Institutional Stay: Payer: Medicare Other | Admitting: Primary Care

## 2020-04-29 DIAGNOSIS — I5032 Chronic diastolic (congestive) heart failure: Secondary | ICD-10-CM

## 2020-04-29 DIAGNOSIS — Z515 Encounter for palliative care: Secondary | ICD-10-CM

## 2020-04-29 NOTE — Progress Notes (Signed)
[facility] Consult Note Telephone: [phone]  Fax: [phone]  PATIENT NAME: Danielle Munoz [address] [place] [address] [phone] (home)  DOB: 1941/01/04 MRN: [id number]  PRIMARY CARE PROVIDER:    Marisa Hua, MD,  [place] [address] [phone]  REFERRING PROVIDER:   Marisa Hua, MD [place],  [place] [address] [phone]  RESPONSIBLE PARTY:   Extended Emergency Contact Information Primary Emergency Contact: Danielle Munoz Address: [address] [place]          [place], [place] [address] Danielle Munoz of [place] Phone: [phone] Mobile Phone: [phone] Relation: Daughter Secondary Emergency Contact: Danielle Munoz Address: [address]          [place],  [address] [place] Phone: [phone] Relation: Daughter  I met face to face with patient in the facility.  ASSESSMENT AND RECOMMENDATIONS:   1. Advance Care Planning/Goals of Care: Goals include to maximize quality of life and symptom management.  Has DNR on record. Recent D/c from hospice.  2. Symptom Management:   I met with Mrs Danielle Munoz who was lying in bed. She states she was just resting and was able to sit up herself and feed herself once her lunch arrived. She states she does not hear from her family or see them.    We discussed the covid vaccine which she thinks she's had but said she wanted a shot she's not had it. She endorses being covid tested.  Staff reports no recent changes or concerns but house NP had felt she may have been declining some. Today she appears in NAD and able to do basic adls and mobility maneuvers. She denies pain.  3. Follow up Palliative Care Visit: Palliative care will continue to follow for goals of care clarification and symptom management. Return 4-6 weeks or prn.  4. Family /Caregiver/Community Supports:  Daughters in area, message  left with Butch Penny as no answer by phone. Pt Lives in [place].  5. Cognitive / Functional decline: A and O x 2, forgetful, able to sit to stand, use w/c and do some basic adls. Needs assistance with iadls.  I spent 25 minutes providing this consultation,  from 1000 to 1025. More than 50% of the time in this consultation was spent coordinating communication.   CHIEF COMPLAINT: fatigue  HISTORY OF PRESENT ILLNESS:  Danielle Munoz is a 79 y.o. year old female with multiple medical problems including CHF, COPD and obesity . Palliative Care was asked to follow this patient by consultation request of Marisa Hua, MD to help address advance care planning and goals of care. This is a follow up visit.  CODE STATUS: DNR  PPS:50%  HOSPICE ELIGIBILITY/DIAGNOSIS: TBD  PAST MEDICAL HISTORY:  Past Medical History:  Diagnosis Date   Atrial flutter ([place])    a. s/p TEE/DCCV 77/11 @ [facility] complicated by asystole and junctional rhythm requiring epi, atropine, and CPR x < 1 minute; b. amio and Eliquis; c. CHADS2VASC => 6 (CHF, HTN, age x 2, vascular disease, female)   Chronic combined systolic and diastolic CHF (congestive heart failure) ([place])    CKD (chronic kidney disease), stage II    COPD (chronic obstructive pulmonary disease) ([place])    Hypertension    Hypothyroidism    Iron deficiency anemia    Morbid obesity ([place])    Noncompliance     SOCIAL HX:  Social History   Tobacco  Use   Smoking status: Former Smoker    Types: Cigarettes   Smokeless tobacco: Never Used  Substance Use Topics   Alcohol use: No   FAMILY HX:  Family History  Problem Relation Age of Onset   Diabetes Brother    CAD Mother    CAD Father    Breast cancer Sister     ALLERGIES: No Known Allergies   PERTINENT MEDICATIONS:  Outpatient Encounter Medications as of 04/29/2020  Medication Sig   acetaminophen (TYLENOL) 325 MG tablet Take 650 mg by mouth every 6 (six) hours as needed for mild pain, moderate  pain or fever.   albuterol (PROVENTIL HFA;VENTOLIN HFA) 108 (90 Base) MCG/ACT inhaler Inhale 2 puffs into the lungs every 6 (six) hours as needed for wheezing or shortness of breath.   docusate sodium (COLACE) 100 MG capsule Take 100 mg by mouth daily.    ketoconazole (NIZORAL) 2 % cream Apply 1 application topically 2 (two) times daily. To left groin until healed.   levothyroxine (SYNTHROID, LEVOTHROID) 100 MCG tablet Take 100 mcg by mouth daily before breakfast.   LORazepam (ATIVAN) 1 MG tablet Take 1 mg by mouth 3 (three) times daily.   metoprolol tartrate (LOPRESSOR) 50 MG tablet Take 50 mg by mouth 2 (two) times daily.   morphine 20 MG/5ML solution Take 2 mg by mouth every 2 (two) hours as needed for pain. Give 2 mg (0.5 ml) by mouth every 2 hours as needed for pain.   nitroGLYCERIN (NITROSTAT) 0.3 MG SL tablet Place 0.3 mg under the tongue every 5 (five) minutes as needed for chest pain. If no relief call MD.   nystatin (NYSTATIN) powder Apply 1 application topically 3 (three) times daily as needed. Under breasts prn itching   torsemide (DEMADEX) 20 MG tablet Take 80 mg by mouth 2 (two) times daily.    No facility-administered encounter medications on file as of 04/29/2020.    PHYSICAL EXAM / ROS:   Current and past weights: unchanged. General: NAD, frail appearing, obese Cardiovascular: Reg pulse, no chest pain reported, no  LE edema  Pulmonary: Lungs clear all fields, no cough, no increased SOB, room air Abdomen: appetite good, denies  constipation, continent of bowel GU: denies dysuria, incontinent of urine MSK:  ++ joint and ROM abnormalities, ambulatory with stand by, uses w/c Skin: no rashes or wounds reported Neurological: Weakness, endorses some fatigue, resting this AM  Jason Coop, NP , DNP, MPH, [facility]  COVID-19 PATIENT SCREENING TOOL  Person answering questions: ____________Staff_______ _____   1.  Is the patient or any family member in the home  showing any signs or symptoms regarding respiratory infection?               Person with Symptom- __________NA_________________  a. Fever                                                                          Yes___ No___          ___________________  b. Shortness of breath  Yes___ No___          ___________________ °c. Cough/congestion                                       Yes___  No___         ___________________ °d. Body aches/pains                                                         Yes___ No___        ____________________ °e. Gastrointestinal symptoms (diarrhea, nausea)           Yes___ No___        ____________________ ° °2. Within the past 14 days, has anyone living in the home had any contact with someone with or under investigation for COVID-19?    °Yes___ No_X_   Person __________________ ° ° °

## 2020-06-09 ENCOUNTER — Telehealth: Payer: Self-pay

## 2020-06-09 ENCOUNTER — Other Ambulatory Visit: Payer: Self-pay

## 2020-06-09 ENCOUNTER — Encounter: Payer: Self-pay | Admitting: Primary Care

## 2020-06-09 ENCOUNTER — Non-Acute Institutional Stay: Payer: Medicare Other | Admitting: Primary Care

## 2020-06-09 DIAGNOSIS — Z515 Encounter for palliative care: Secondary | ICD-10-CM

## 2020-06-09 DIAGNOSIS — J449 Chronic obstructive pulmonary disease, unspecified: Secondary | ICD-10-CM

## 2020-06-09 DIAGNOSIS — I5032 Chronic diastolic (congestive) heart failure: Secondary | ICD-10-CM

## 2020-06-09 NOTE — Telephone Encounter (Signed)
Received message from [place] at [facility] that patient has had a change and requesting a call. TC x 2 placed to facility. Transferred to nursing station, phone rang without answer. Palliative NP, Katie, aware and to make visit today.

## 2020-06-09 NOTE — Progress Notes (Addendum)
[facility] Consult Note Telephone: [phone]  Fax: [phone]  PATIENT NAME: Danielle Munoz [address] [place] [address] [phone] (home)  DOB: 06-07-1941 MRN: [id number]  PRIMARY CARE PROVIDER:    Marisa Hua, MD,  [place] [address] [phone]  REFERRING PROVIDER:   Marisa Hua, MD [place],  [place] [address] [phone]  RESPONSIBLE PARTY:   Extended Emergency Contact Information Primary Emergency Contact: Peggye Form Address: [address] [place]          [place], [place] [address] Johnnette Litter of [place] Phone: [phone] Mobile Phone: [phone] Relation: Daughter Secondary Emergency Contact: Doralee Albino Address: [address]          [place], [place] [address] [place] Phone: [phone] Relation: Daughter  I met face to face with patient and family in facility.  ASSESSMENT AND RECOMMENDATIONS:   1. Advance Care Planning/Goals of Care: Goals include to maximize quality of life and symptom management. Our advance care planning conversation included a discussion about:     Exploration of personal, cultural or spiritual beliefs that might influence medical decisions   Exploration of goals of care in the event of a sudden injury or illness   Identification  of a healthcare agent   Family at bedside, reiterate comfort goals and decline diagnostics due to lack of treatment options.   2. Symptom Management:  Staff called from the nursing home  and I visited today. Family at bedside. Patient has had  an event 2 days ago and now minimally responsive to stimulation. Her oxygen Sats have  dipped into the high 80s on 2 L of oxygen and were  still in the high 80s on 3 L. She was mildly dyspneic. Her primary provider said yesterday she was worse and they have treated her with rox;  it has been effective. Her blood pressure  114/66 and she does have tachycardia at 115. Her respiration rate is around 22.  She was given 5 mg of morphine at 4 AM but has had this PRN since her past hospice admission. The general consensus is that she has had an event, perhaps another MI. She did vomit this morning, which was new. She cannot reliably report pain or other symptoms. I have gotten feedback from the facility providers over the past few months that she has been gradually declining  for some weeks and less active but today there is an acute process. She is not eating now and is able to take a little bit of moisture off of a sponge ; PPS is a weak 30%.   There's a chance it's hypo delirium. She's being treated for skin candidiasis with Diflucan but getting a urine by cath would be extremely onerous due to her  debility. It may be reasonable to treat a possible UTI empirically if family would like. They have stated comfort goals. She is obese and has eaten up until several days ago so weights are not particularly reliable. No edema however. Her heart rate was rapid as above and a bit thready. She is also difficult to auscultate but was moving air fairly well. Family would like hospice again. She was previously with [facility]. Her diagnoses are coronary artery disease congestive heart failure and COPD mi.Facility provider Other diagnoses include Cognitive impairment moderate dementia.  Referral made to [facility] for admission in place.   3. Follow  up Palliative Care Visit: referred to hospice 4. Family /Caregiver/Community Supports: Family at bedside, daughters and sister, and sons in [employer]. Lives in [place].  5. Cognitive / Functional decline: Lethargic, opens eyes and states I'm dying, I am dead. Staff endorses this theme over some weeks.   I spent 60 minutes providing this consultation,  from 1100 to 1200. More than 50% of the time in this consultation was spent coordinating communication.   CHIEF COMPLAINT: lethargy,  tachycardia  HISTORY OF PRESENT ILLNESS:  Danielle Munoz is a 79 y.o. year old female with multiple medical problems including CAD, CKD Stage 2, CHF, oxygen dependence, h/o mi, dementia, obesity. Palliative Care was asked to follow this patient by consultation request of Marisa Hua, MD to help address advance care planning and goals of care. This is a follow up visit.  CODE STATUS: DNR  PPS: 30%  HOSPICE ELIGIBILITY/DIAGNOSIS: yes/ CAD  PAST MEDICAL HISTORY:  Past Medical History:  Diagnosis Date  . Atrial flutter ([place])    a. s/p TEE/DCCV 48/25 @ [facility] complicated by asystole and junctional rhythm requiring epi, atropine, and CPR x < 1 minute; b. amio and Eliquis; c. CHADS2VASC => 6 (CHF, HTN, age x 2, vascular disease, female)  . Chronic combined systolic and diastolic CHF (congestive heart failure) ([place])   . CKD (chronic kidney disease), stage II   . COPD (chronic obstructive pulmonary disease) ([place])   . Hypertension   . Hypothyroidism   . Iron deficiency anemia   . Morbid obesity ([place])   . Noncompliance     SOCIAL HX:  Social History   Tobacco Use  . Smoking status: Former Smoker    Types: Cigarettes  . Smokeless tobacco: Never Used  Substance Use Topics  . Alcohol use: No   FAMILY HX:  Family History  Problem Relation Age of Onset  . Diabetes Brother   . CAD Mother   . CAD Father   . Breast cancer Sister     ALLERGIES: No Known Allergies   PERTINENT MEDICATIONS:  Outpatient Encounter Medications as of 06/09/2020  Medication Sig  . acetaminophen (TYLENOL) 325 MG tablet Take 650 mg by mouth every 6 (six) hours as needed for mild pain, moderate pain or fever.  Marland Kitchen albuterol (PROVENTIL HFA;VENTOLIN HFA) 108 (90 Base) MCG/ACT inhaler Inhale 2 puffs into the lungs every 6 (six) hours as needed for wheezing or shortness of breath.  . docusate sodium (COLACE) 100 MG capsule Take 100 mg by mouth daily.   Marland Kitchen ketoconazole (NIZORAL) 2 % cream Apply 1 application topically  2 (two) times daily. To left groin until healed.  Marland Kitchen levothyroxine (SYNTHROID, LEVOTHROID) 100 MCG tablet Take 100 mcg by mouth daily before breakfast.  . LORazepam (ATIVAN) 1 MG tablet Take 1 mg by mouth 3 (three) times daily.  . metoprolol tartrate (LOPRESSOR) 50 MG tablet Take 50 mg by mouth 2 (two) times daily.  Marland Kitchen morphine 20 MG/5ML solution Take 2 mg by mouth every 2 (two) hours as needed for pain. Give 2 mg (0.5 ml) by mouth every 2 hours as needed for pain.  . nitroGLYCERIN (NITROSTAT) 0.3 MG SL tablet Place 0.3 mg under the tongue every 5 (five) minutes as needed for chest pain. If no relief call MD.  . nystatin (NYSTATIN) powder Apply 1 application topically 3 (three) times daily as needed. Under breasts prn itching  . torsemide (DEMADEX) 20 MG tablet Take 80 mg by mouth 2 (two) times daily.    No facility-administered  encounter medications on file as of 06/09/2020.    PHYSICAL EXAM / ROS:  VS HR 115, bp 114/66 RR 22-24 Current and past weights: 210 lbs, BMI 41, dropped from 49 in 2/21 General: NAD, frail appearing, obese Cardiovascular: S1S2, RRR, tachycardia, no chest pain reported, no LE edema  Pulmonary: Moving air on auscultation, No cyanosis, no cough, ++ increased SOB, Ox 87% on 3 L, breathing labor Abdomen: appetite fair prior, intake min now,  incontinent of bowel GU: no signs dysuria, incontinent of urine MSK:  + joint and ROM abnormalities, non ambulatory, bed bound Skin: LE cool to touch to mid tibia bil Neurological: Weakness, lethargic, dementia baseline. Minimally verbal  Jason Coop, NP , DNP, MPH, [facility]  COVID-19 PATIENT SCREENING TOOL  Person answering questions: ____________staff______ _____   1.  Is the patient or any family member in the home showing any signs or symptoms regarding respiratory infection?               Person with Symptom- __________NA_________________  a. Fever                                                                           Yes___ No___          ___________________  b. Shortness of breath                                                    Yes___ No___          ___________________ c. Cough/congestion                                       Yes___  No___         ___________________ d. Body aches/pains                                                         Yes___ No___        ____________________ e. Gastrointestinal symptoms (diarrhea, nausea)           Yes___ No___        ____________________  2. Within the past 14 days, has anyone living in the home had any contact with someone with or under investigation for COVID-19?    Yes___ No_X_   Person __________________

## 2020-07-01 DEATH — deceased

## 2020-11-11 IMAGING — DX DG CHEST 1V PORT
1 series · 1 of 1 positions shown · non-contrast
Comparison: Chest radiograph from one day prior.

CLINICAL DATA: Acute respiratory failure

EXAM:
PORTABLE CHEST 1 VIEW

[chest ap]
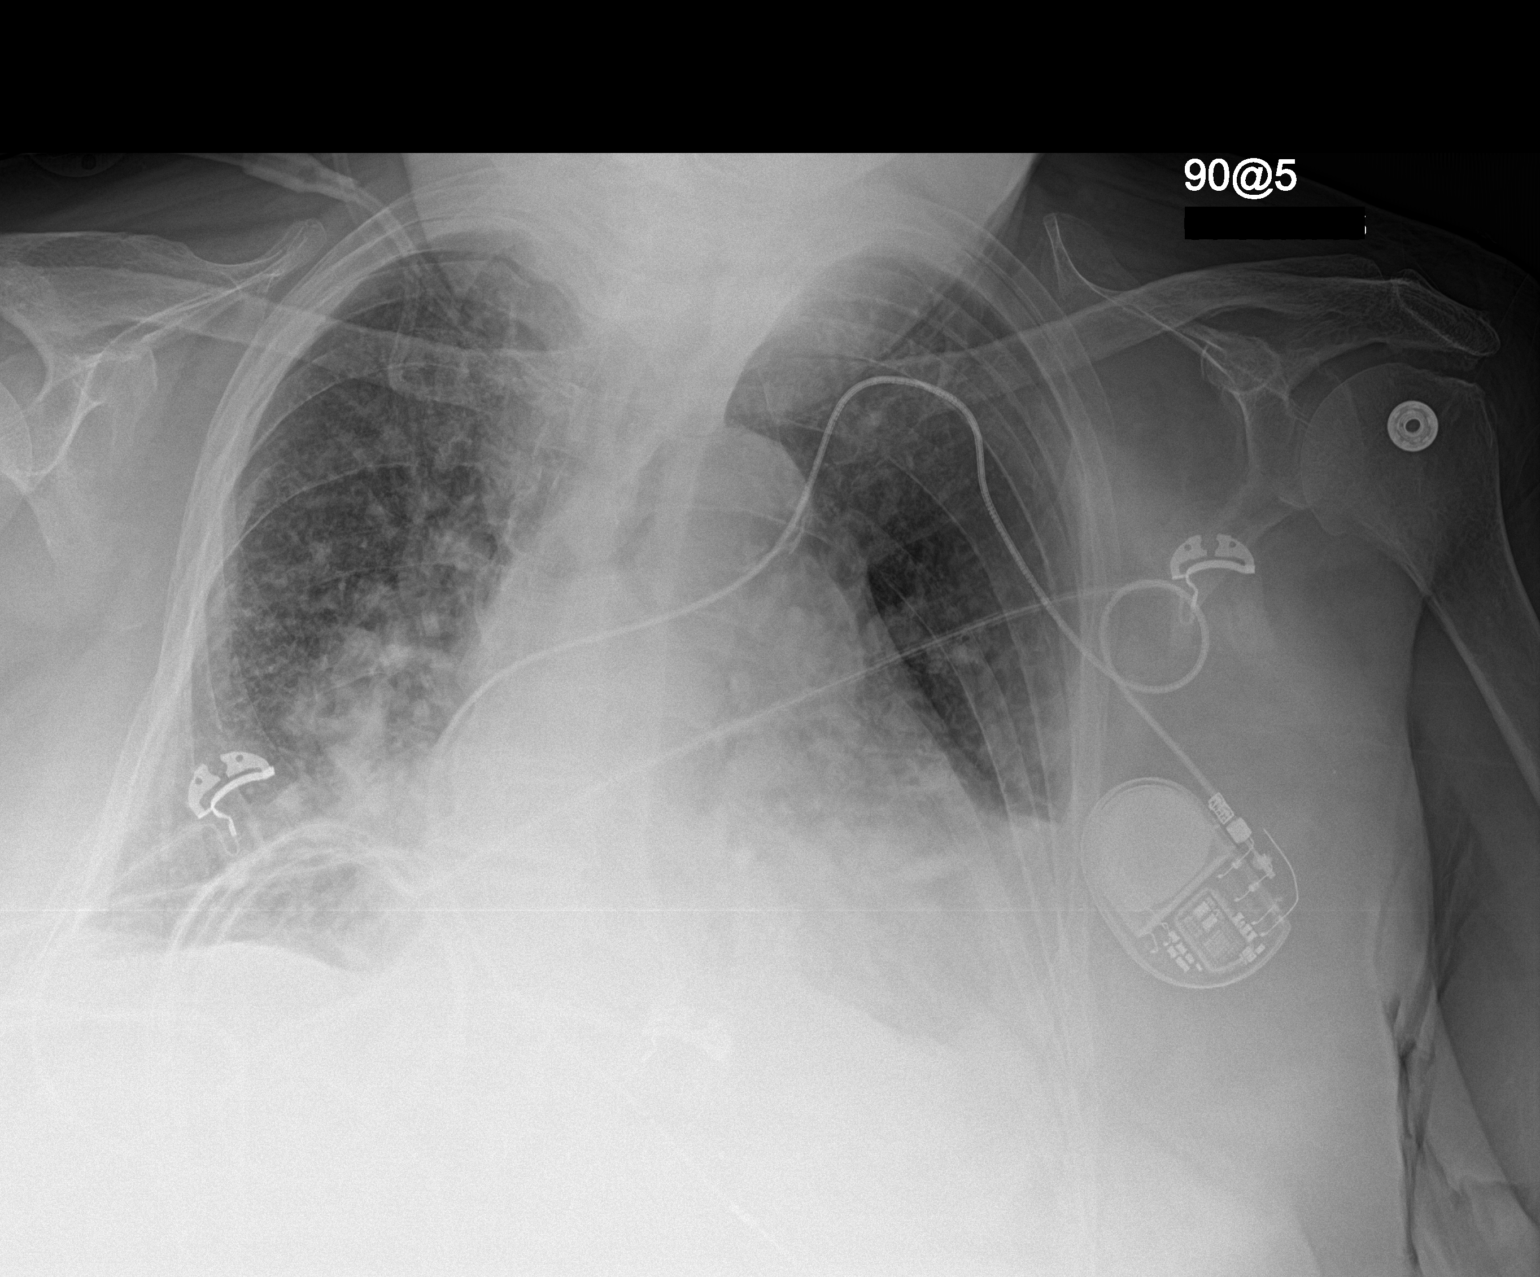

[1 of 1 positions shown; findings below may reference images not displayed]

FINDINGS: Interval extubation with removal of enteric tube. Stable
configuration of single lead left subclavian pacemaker. Stable
cardiomediastinal silhouette with mild cardiomegaly. No
pneumothorax. Stable small left pleural effusion. No significant
right pleural effusion. Mild-to-moderate pulmonary edema not
appreciably changed. Mild bibasilar atelectasis, decreased.
IMPRESSION: 1. Stable mild-to-moderate congestive heart failure.
2. Stable small left pleural effusion.
3. Mild bibasilar atelectasis, decreased.

## 2020-11-14 IMAGING — DX DG CHEST 1V PORT
1 series · 1 of 1 positions shown · non-contrast
Comparison: 10/19/2018.

CLINICAL DATA: Respiratory failure.

EXAM:
PORTABLE CHEST 1 VIEW

[chest ap]
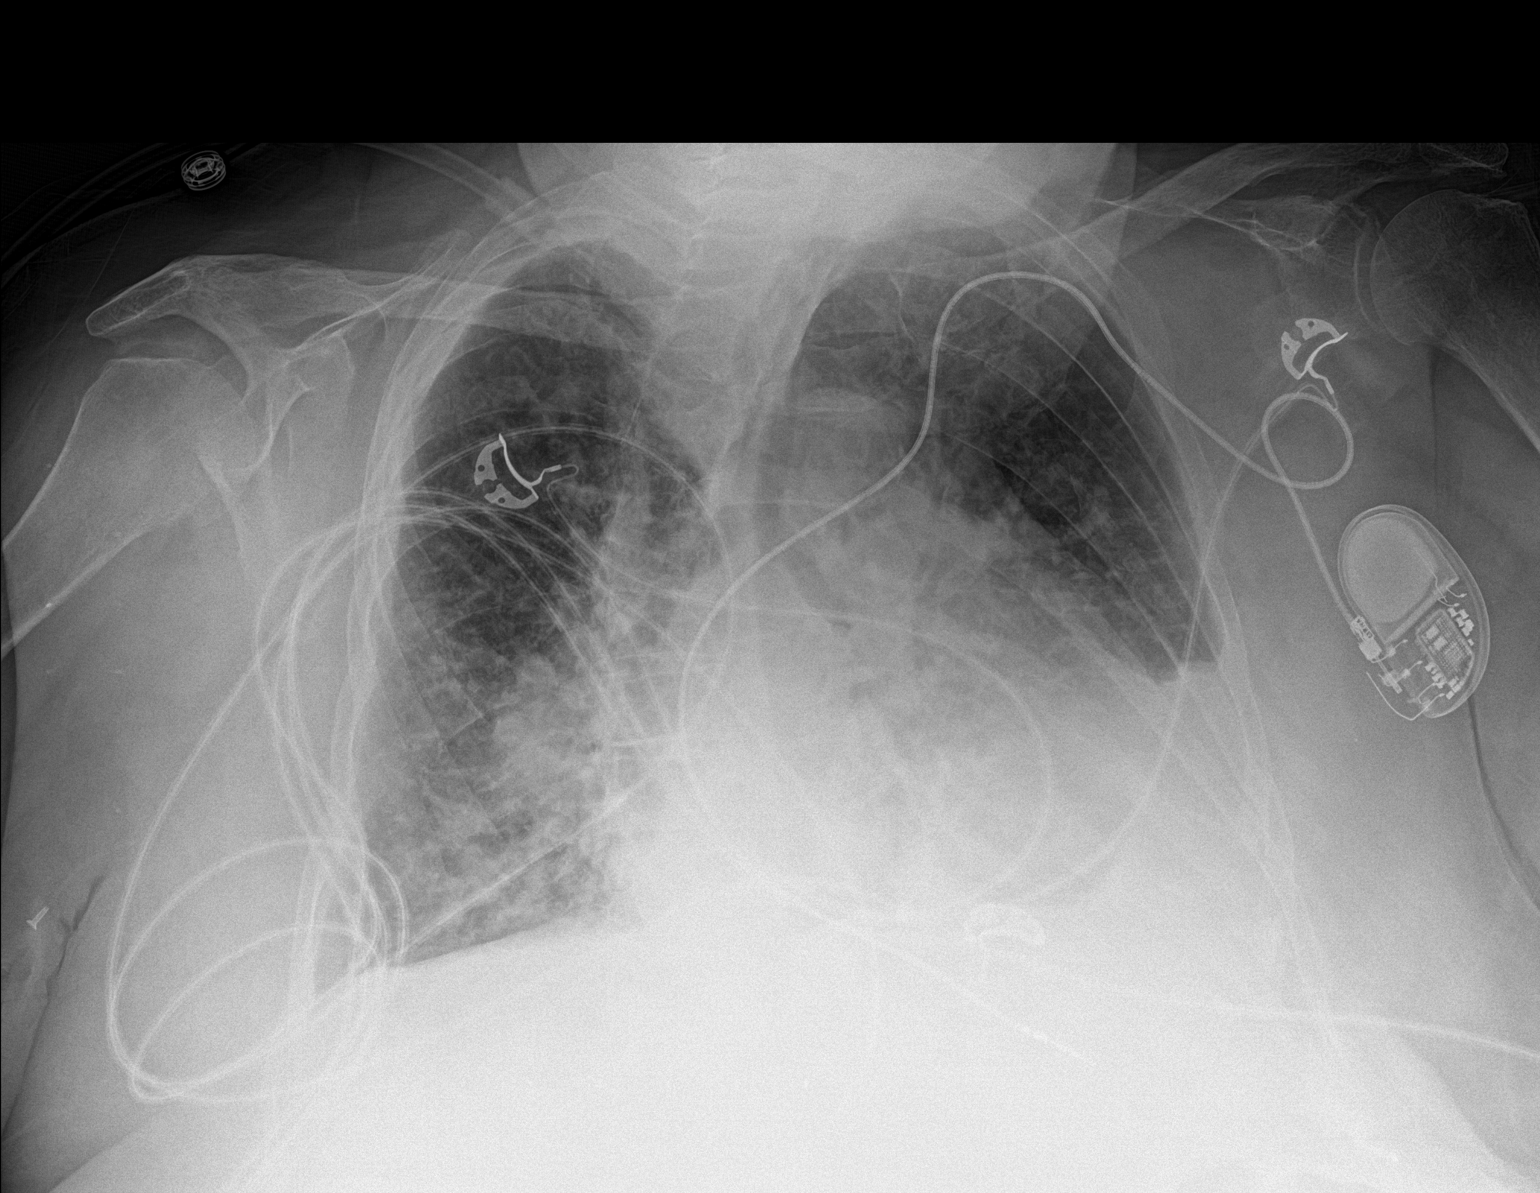

[1 of 1 positions shown; findings below may reference images not displayed]

FINDINGS: Cardiac pacer with lead tip over the right ventricle. Cardiomegaly.
Diffuse bilateral interstitial prominence consistent with CHF.
Progressive left pleural effusion. No pneumothorax.
IMPRESSION: 1. Cardiac pacer with lead tip over the right ventricle.
Cardiomegaly. Diffuse bilateral interstitial prominence consistent
CHF.

2.  Progressive left pleural effusion.
